# Patient Record
Sex: Female | Born: 1965
Health system: Southern US, Community
[De-identification: ages and names within clinical notes are randomized; demographics above are authoritative.]

## PROBLEM LIST (undated history)

## (undated) DIAGNOSIS — I10 Essential (primary) hypertension: Secondary | ICD-10-CM

## (undated) DIAGNOSIS — S82853A Displaced trimalleolar fracture of unspecified lower leg, initial encounter for closed fracture: Secondary | ICD-10-CM

## (undated) HISTORY — DX: Essential (primary) hypertension: I10

## (undated) HISTORY — PX: NO PAST SURGERIES: SHX2092

---

## 2015-10-16 ENCOUNTER — Encounter: Payer: Self-pay | Admitting: Family

## 2015-10-16 ENCOUNTER — Other Ambulatory Visit: Payer: Self-pay

## 2015-10-16 ENCOUNTER — Ambulatory Visit (INDEPENDENT_AMBULATORY_CARE_PROVIDER_SITE_OTHER): Payer: 59 | Admitting: Family

## 2015-10-16 VITALS — BP 180/102 | HR 66 | Temp 98.2°F | Resp 16 | Ht 66.0 in | Wt 204.0 lb

## 2015-10-16 DIAGNOSIS — I10 Essential (primary) hypertension: Secondary | ICD-10-CM | POA: Insufficient documentation

## 2015-10-16 MED ORDER — NIFEDIPINE ER OSMOTIC RELEASE 90 MG PO TB24: 90.0000 mg | ORAL_TABLET | Freq: Every day | ORAL | Status: DC

## 2015-10-16 NOTE — Patient Instructions (Addendum)
Thank you for choosing [facility].  Summary/Instructions:  Your prescription(s) have been submitted to your pharmacy or been printed and provided for you. Please take as directed and contact our office if you believe you are having problem(s) with the medication(s) or have any questions.  Please stop by the lab on the basement level of the building for your blood work. Your results will be released to [facility] (or called to you) after review, usually within 72 hours after test completion. If any changes need to be made, you will be notified at that same time.  If your symptoms worsen or fail to improve, please contact our office for further instruction, or in case of emergency go directly to the emergency room at the closest medical facility.   Hypertension Hypertension, commonly called high blood pressure, is when the force of blood pumping through your arteries is too strong. Your arteries are the blood vessels that carry blood from your heart throughout your body. A blood pressure reading consists of a higher number over a lower number, such as 110/72. The higher number (systolic) is the pressure inside your arteries when your heart pumps. The lower number (diastolic) is the pressure inside your arteries when your heart relaxes. Ideally you want your blood pressure below 120/80. Hypertension forces your heart to work harder to pump blood. Your arteries may become narrow or stiff. Having untreated or uncontrolled hypertension can cause heart attack, stroke, kidney disease, and other problems. RISK FACTORS Some risk factors for high blood pressure are controllable. Others are not.  Risk factors you cannot control include:   Race. You may be at higher risk if you are African American.  Age. Risk increases with age.  Gender. Men are at higher risk than women before age 42 years. After age 26, women are at higher risk than men. Risk factors you can control include:  Not getting enough  exercise or physical activity.  Being overweight.  Getting too much fat, sugar, calories, or salt in your diet.  Drinking too much alcohol. SIGNS AND SYMPTOMS Hypertension does not usually cause signs or symptoms. Extremely high blood pressure (hypertensive crisis) may cause headache, anxiety, shortness of breath, and nosebleed. DIAGNOSIS To check if you have hypertension, your health care provider will measure your blood pressure while you are seated, with your arm held at the level of your heart. It should be measured at least twice using the same arm. Certain conditions can cause a difference in blood pressure between your right and left arms. A blood pressure reading that is higher than normal on one occasion does not mean that you need treatment. If it is not clear whether you have high blood pressure, you may be asked to return on a different day to have your blood pressure checked again. Or, you may be asked to monitor your blood pressure at home for 1 or more weeks. TREATMENT Treating high blood pressure includes making lifestyle changes and possibly taking medicine. Living a healthy lifestyle can help lower high blood pressure. You may need to change some of your habits. Lifestyle changes may include:  Following the DASH diet. This diet is high in fruits, vegetables, and whole grains. It is low in salt, red meat, and added sugars.  Keep your sodium intake below 2,300 mg per day.  Getting at least 30-45 minutes of aerobic exercise at least 4 times per week.  Losing weight if necessary.  Not smoking.  Limiting alcoholic beverages.  Learning ways to reduce stress.  Your health care provider may prescribe medicine if lifestyle changes are not enough to get your blood pressure under control, and if one of the following is true:  You are 62-39 years of age and your systolic blood pressure is above 140.  You are 8 years of age or older, and your systolic blood pressure is above  150.  Your diastolic blood pressure is above 90.  You have diabetes, and your systolic blood pressure is over [id number] or your diastolic blood pressure is over 90.  You have kidney disease and your blood pressure is above 140/90.  You have heart disease and your blood pressure is above 140/90. Your personal target blood pressure may vary depending on your medical conditions, your age, and other factors. HOME CARE INSTRUCTIONS  Have your blood pressure rechecked as directed by your health care provider.   Take medicines only as directed by your health care provider. Follow the directions carefully. Blood pressure medicines must be taken as prescribed. The medicine does not work as well when you skip doses. Skipping doses also puts you at risk for problems.  Do not smoke.   Monitor your blood pressure at home as directed by your health care provider. SEEK MEDICAL CARE IF:   You think you are having a reaction to medicines taken.  You have recurrent headaches or feel dizzy.  You have swelling in your ankles.  You have trouble with your vision. SEEK IMMEDIATE MEDICAL CARE IF:  You develop a severe headache or confusion.  You have unusual weakness, numbness, or feel faint.  You have severe chest or abdominal pain.  You vomit repeatedly.  You have trouble breathing. MAKE SURE YOU:   Understand these instructions.  Will watch your condition.  Will get help right away if you are not doing well or get worse.   This information is not intended to replace advice given to you by your health care provider. Make sure you discuss any questions you have with your health care provider.   Document Released: 07/01/2005 Document Revised: 11/15/2014 Document Reviewed: 04/23/2013 Elsevier Interactive Patient Education [employer].

## 2015-10-16 NOTE — Progress Notes (Signed)
Subjective:    Patient ID: Colleen Potter, female    DOB: [birth year], 50 y.o.   MRN: [id number]  Chief Complaint  Patient presents with  . Establish Care    Refill of medication     HPI:  Colleen Potter is a 50 y.o. female who  has a past medical history of Hypertension. and presents today For an office visit to establish care.  1.) Hypertension - Previously diagnosed with hypertension. Reports taking the medication as prescribed when she has it and has been out over the past 2 months. Does not currently monitor her blood pressure at home. Denies symptoms of end organ damage.   BP Readings from Last 3 Encounters:  10/16/15 180/102    No Known Allergies   No outpatient prescriptions prior to visit.   No facility-administered medications prior to visit.     Past Medical History  Diagnosis Date  . Hypertension      History reviewed. No pertinent past surgical history.   Family History  Problem Relation Age of Onset  . Healthy Mother      Social History   Social History  . Marital Status: Single    Spouse Name: N/A  . Number of Children: 2  . Years of Education: 12   Occupational History  . E[employer]    Social History Main Topics  . Smoking status: Never Smoker   . Smokeless tobacco: Never Used  . Alcohol Use: No  . Drug Use: No  . Sexual Activity: Not on file   Other Topics Concern  . Not on file   Social History Narrative   Fun: Shop.   Denies abuse and feels safe at home.      Review of Systems  Constitutional: Negative for fever, chills and fatigue.  Eyes:       Negative for changes in vision.   Respiratory: Negative for chest tightness and shortness of breath.   Cardiovascular: Negative for chest pain, palpitations and leg swelling.  Neurological: Negative for headaches.      Objective:    BP 180/102 mmHg  Pulse 66  Temp(Src) 98.2 F (36.8 C) (Oral)  Resp 16  Ht 5\' 6"  (1.676 m)  Wt 204 lb (92.534 kg)  BMI 32.94 kg/m2  SpO2 97% Nursing  note and vital signs reviewed.  Physical Exam  Constitutional: She is oriented to person, place, and time. She appears well-developed and well-nourished. No distress.  Cardiovascular: Normal rate, regular rhythm, normal heart sounds and intact distal pulses.   Pulmonary/Chest: Effort normal and breath sounds normal.  Neurological: She is alert and oriented to person, place, and time.  Skin: Skin is warm and dry.  Psychiatric: She has a normal mood and affect. Her behavior is normal. Judgment and thought content normal.       Assessment & Plan:   Problem List Items Addressed This Visit      Cardiovascular and Mediastinum   Essential hypertension - Primary    Blood pressure is above goal 140/90 secondary to not having medications for 2 months. No symptoms of end organ damage. Obtain basic metabolic panel. Restart nifedipine. Encouraged to monitor blood pressure at home. Discussed lifestyle management to help control her blood pressure. Follow-up in 3 weeks for nurse visit to determine adequate control.      Relevant Medications   NIFEdipine (PROCARDIA XL/ADALAT-CC) 90 MG 24 hr tablet   Other Relevant Orders   Basic Metabolic Panel (BMET)      I have changed Ms.  Marcinek's NIFEdipine.   Follow-up: Return in about 3 weeks (around 11/06/2015) for Nurse Visit for BP.  Mauricio Po, FNP

## 2015-10-16 NOTE — Assessment & Plan Note (Signed)
Blood pressure is above goal 140/90 secondary to not having medications for 2 months. No symptoms of end organ damage. Obtain basic metabolic panel. Restart nifedipine. Encouraged to monitor blood pressure at home. Discussed lifestyle management to help control her blood pressure. Follow-up in 3 weeks for nurse visit to determine adequate control.

## 2015-10-16 NOTE — Progress Notes (Signed)
Pre visit review using our clinic review tool, if applicable. No additional management support is needed unless otherwise documented below in the visit note. 

## 2015-10-30 MED FILL — NIFEDIPINE ER 90 MG TABLET: 90 | 90 days supply | Qty: 90 | Fill #0

## 2016-02-06 ENCOUNTER — Ambulatory Visit: Payer: 59 | Admitting: Family

## 2016-02-07 ENCOUNTER — Telehealth: Payer: Self-pay | Admitting: Family

## 2016-02-07 NOTE — Telephone Encounter (Signed)
Patient no showed for med refill follow up on 7/25.  Please advise.

## 2016-02-07 NOTE — Telephone Encounter (Signed)
Ok to reschedule if she calls back.  

## 2016-02-08 NOTE — Telephone Encounter (Signed)
noted 

## 2016-02-29 ENCOUNTER — Encounter: Payer: Self-pay | Admitting: Family

## 2016-02-29 ENCOUNTER — Ambulatory Visit (INDEPENDENT_AMBULATORY_CARE_PROVIDER_SITE_OTHER): Payer: 59 | Admitting: Family

## 2016-02-29 DIAGNOSIS — I10 Essential (primary) hypertension: Secondary | ICD-10-CM | POA: Diagnosis not present

## 2016-02-29 MED ORDER — NIFEDIPINE ER OSMOTIC RELEASE 90 MG PO TB24: 90.0000 mg | ORAL_TABLET | Freq: Every day | ORAL | 0 refills | Status: DC

## 2016-02-29 MED FILL — NIFEDIPINE ER 90 MG TABLET: 90 | 90 days supply | Qty: 90 | Fill #0

## 2016-02-29 NOTE — Patient Instructions (Addendum)
Thank you for choosing [facility].  Summary/Instructions:  Please continue to take your medication as prescribed.  Monitor your blood pressure at home as able.   DASH Eating Plan DASH stands for "Dietary Approaches to Stop Hypertension." The DASH eating plan is a healthy eating plan that has been shown to reduce high blood pressure (hypertension). Additional health benefits may include reducing the risk of type 2 diabetes mellitus, heart disease, and stroke. The DASH eating plan may also help with weight loss. WHAT DO I NEED TO KNOW ABOUT THE DASH EATING PLAN? For the DASH eating plan, you will follow these general guidelines:  Choose foods with a percent daily value for sodium of less than 5% (as listed on the food label).  Use salt-free seasonings or herbs instead of table salt or sea salt.  Check with your health care provider or pharmacist before using salt substitutes.  Eat lower-sodium products, often labeled as "lower sodium" or "no salt added."  Eat fresh foods.  Eat more vegetables, fruits, and low-fat dairy products.  Choose whole grains. Look for the word "whole" as the first word in the ingredient list.  Choose fish and skinless chicken or [place] more often than red meat. Limit fish, poultry, and meat to 6 oz (170 g) each day.  Limit sweets, desserts, sugars, and sugary drinks.  Choose heart-healthy fats.  Limit cheese to 1 oz (28 g) per day.  Eat more home-cooked food and less restaurant, buffet, and fast food.  Limit fried foods.  Cook foods using methods other than frying.  Limit canned vegetables. If you do use them, rinse them well to decrease the sodium.  When eating at a restaurant, ask that your food be prepared with less salt, or no salt if possible. WHAT FOODS CAN I EAT? Seek help from a dietitian for individual calorie needs. Grains Whole grain or whole wheat bread. Brown rice. Whole grain or whole wheat pasta. Quinoa, bulgur, and whole  grain cereals. Low-sodium cereals. Corn or whole wheat flour tortillas. Whole grain cornbread. Whole grain crackers. Low-sodium crackers. Vegetables Fresh or frozen vegetables (raw, steamed, roasted, or grilled). Low-sodium or reduced-sodium tomato and vegetable juices. Low-sodium or reduced-sodium tomato sauce and paste. Low-sodium or reduced-sodium canned vegetables.  Fruits All fresh, canned (in natural juice), or frozen fruits. Meat and Other Protein Products Ground beef (85% or leaner), grass-fed beef, or beef trimmed of fat. Skinless chicken or [place]. Ground chicken or [place]. Pork trimmed of fat. All fish and seafood. Eggs. Dried beans, peas, or lentils. Unsalted nuts and seeds. Unsalted canned beans. Dairy Low-fat dairy products, such as skim or 1% milk, 2% or reduced-fat cheeses, low-fat ricotta or cottage cheese, or plain low-fat yogurt. Low-sodium or reduced-sodium cheeses. Fats and Oils Tub margarines without trans fats. Light or reduced-fat mayonnaise and salad dressings (reduced sodium). Avocado. Safflower, olive, or canola oils. Natural peanut or almond butter. Other Unsalted popcorn and pretzels. The items listed above may not be a complete list of recommended foods or beverages. Contact your dietitian for more options. WHAT FOODS ARE NOT RECOMMENDED? Grains White bread. White pasta. White rice. Refined cornbread. Bagels and croissants. Crackers that contain trans fat. Vegetables Creamed or fried vegetables. Vegetables in a cheese sauce. Regular canned vegetables. Regular canned tomato sauce and paste. Regular tomato and vegetable juices. Fruits Dried fruits. Canned fruit in light or heavy syrup. Fruit juice. Meat and Other Protein Products Fatty cuts of meat. Ribs, chicken wings, bacon, sausage, bologna, salami, chitterlings, fatback, hot dogs,  bratwurst, and packaged luncheon meats. Salted nuts and seeds. Canned beans with salt. Dairy Whole or 2% milk, cream, half-and-half,  and cream cheese. Whole-fat or sweetened yogurt. Full-fat cheeses or blue cheese. Nondairy creamers and whipped toppings. Processed cheese, cheese spreads, or cheese curds. Condiments Onion and garlic salt, seasoned salt, table salt, and sea salt. Canned and packaged gravies. Worcestershire sauce. Tartar sauce. Barbecue sauce. Teriyaki sauce. Soy sauce, including reduced sodium. Steak sauce. Fish sauce. Oyster sauce. Cocktail sauce. Horseradish. Ketchup and mustard. Meat flavorings and tenderizers. Bouillon cubes. Hot sauce. Tabasco sauce. Marinades. Taco seasonings. Relishes. Fats and Oils Butter, stick margarine, lard, shortening, ghee, and bacon fat. Coconut, palm kernel, or palm oils. Regular salad dressings. Other Pickles and olives. Salted popcorn and pretzels. The items listed above may not be a complete list of foods and beverages to avoid. Contact your dietitian for more information. WHERE CAN I FIND MORE INFORMATION? National Heart, Lung, and Blood Institute: travelstabloid.com   This information is not intended to replace advice given to you by your health care provider. Make sure you discuss any questions you have with your health care provider.   Document Released: 06/20/2011 Document Revised: 07/22/2014 Document Reviewed: 05/05/2013 Elsevier Interactive Patient Education [employer].

## 2016-02-29 NOTE — Assessment & Plan Note (Signed)
Blood pressure remains uncontrolled and above goal 140/90 with current regimen with concern for patient compliance is refills are inconsistent. No symptoms of end organ damage noted assessment or worse headache of life. Encouraged to follow low-sodium diet. Monitor blood pressure at home. Follow-up in 3 weeks for nurse visit check.

## 2016-02-29 NOTE — Progress Notes (Signed)
   Subjective:    Patient ID: Colleen Potter, female    DOB: 1965/09/22, 50 y.o.   MRN: [id number]  Chief Complaint  Patient presents with  . Medication Refill    medication refill    HPI:  Colleen Potter is a 50 y.o. female who  has a past medical history of Hypertension. and presents today for a follow up.  1.) Hypertension - Currently maintained on nifedipine. Reports that she has been out of the medication for the past 2 weeks. Does not currently monitor her blood pressure at home. Denies symptoms of end organ damage or worst headache of life. Does not currently follow a low sodium diet. Works as a Secretary/administrator.   BP Readings from Last 3 Encounters:  02/29/16 (!) 182/92  10/16/15 (!) 180/102    No Known Allergies   Outpatient Medications Prior to Visit  Medication Sig Dispense Refill  . NIFEdipine (PROCARDIA XL/ADALAT-CC) 90 MG 24 hr tablet Take 1 tablet (90 mg total) by mouth daily. 90 tablet 0   No facility-administered medications prior to visit.     Review of Systems  Constitutional: Negative for chills and fever.  Eyes:       Negative for changes in vision  Respiratory: Negative for cough, chest tightness and wheezing.   Cardiovascular: Negative for chest pain, palpitations and leg swelling.  Neurological: Negative for dizziness, weakness and light-headedness.      Objective:    BP (!) 182/92 (BP Location: Left Arm, Patient Position: Sitting, Cuff Size: Large)   Pulse 74   Temp 98.2 F (36.8 C) (Oral)   Resp 16   Ht 5\' 6"  (1.676 m)   Wt 197 lb (89.4 kg)   SpO2 99%   BMI 31.80 kg/m  Nursing note and vital signs reviewed.  Physical Exam  Constitutional: She is oriented to person, place, and time. She appears well-developed and well-nourished. No distress.  Cardiovascular: Normal rate, regular rhythm, normal heart sounds and intact distal pulses.   Pulmonary/Chest: Effort normal and breath sounds normal.  Neurological: She is alert and oriented to person, place,  and time.  Skin: Skin is warm and dry.  Psychiatric: She has a normal mood and affect. Her behavior is normal. Judgment and thought content normal.       Assessment & Plan:   Problem List Items Addressed This Visit      Cardiovascular and Mediastinum   Essential hypertension    Blood pressure remains uncontrolled and above goal 140/90 with current regimen with concern for patient compliance is refills are inconsistent. No symptoms of end organ damage noted assessment or worse headache of life. Encouraged to follow low-sodium diet. Monitor blood pressure at home. Follow-up in 3 weeks for nurse visit check.      Relevant Medications   NIFEdipine (PROCARDIA XL/ADALAT-CC) 90 MG 24 hr tablet    Other Visit Diagnoses   None.      I am having Ms. Eckhardt maintain her NIFEdipine.   Meds ordered this encounter  Medications  . NIFEdipine (PROCARDIA XL/ADALAT-CC) 90 MG 24 hr tablet    Sig: Take 1 tablet (90 mg total) by mouth daily.    Dispense:  90 tablet    Refill:  0    Order Specific Question:   Supervising Provider    Answer:   Pricilla Holm A [id number]     Follow-up: Return in about 3 weeks (around 03/21/2016), or if symptoms worsen or fail to improve.  Mauricio Po, FNP

## 2016-03-19 ENCOUNTER — Ambulatory Visit: Payer: 59 | Admitting: General Practice

## 2016-03-19 VITALS — BP 138/74

## 2016-03-19 DIAGNOSIS — Z0130 Encounter for examination of blood pressure without abnormal findings: Secondary | ICD-10-CM

## 2016-03-19 NOTE — Progress Notes (Signed)
Blood pressure reviewed. Continue with current medication regimen.

## 2016-05-24 ENCOUNTER — Other Ambulatory Visit: Payer: Self-pay | Admitting: *Deleted

## 2016-05-24 DIAGNOSIS — I10 Essential (primary) hypertension: Secondary | ICD-10-CM

## 2016-05-24 MED ORDER — NIFEDIPINE ER OSMOTIC RELEASE 90 MG PO TB24
90.0000 mg | ORAL_TABLET | Freq: Every day | ORAL | 0 refills | Status: DC
Start: 2016-05-24 — End: 2016-09-18

## 2016-05-24 NOTE — Telephone Encounter (Signed)
Left msg on triage requesting refill on her Procardia. Sent electronically to [facility] pharmacy...Johny Chess

## 2016-06-03 MED FILL — NIFEDIPINE ER 90 MG TABLET: 90 | 90 days supply | Qty: 90 | Fill #0

## 2016-08-03 ENCOUNTER — Emergency Department (HOSPITAL_COMMUNITY): Payer: 59

## 2016-08-03 ENCOUNTER — Encounter (HOSPITAL_COMMUNITY): Payer: Self-pay

## 2016-08-03 ENCOUNTER — Emergency Department (HOSPITAL_COMMUNITY)
Admission: EM | Admit: 2016-08-03 | Discharge: 2016-08-04 | Disposition: A | Discharge status: Home / Self Care | Payer: 59 | Attending: Emergency Medicine | Admitting: Emergency Medicine

## 2016-08-03 DIAGNOSIS — S99911A Unspecified injury of right ankle, initial encounter: Secondary | ICD-10-CM | POA: Diagnosis present

## 2016-08-03 DIAGNOSIS — Y999 Unspecified external cause status: Secondary | ICD-10-CM | POA: Insufficient documentation

## 2016-08-03 DIAGNOSIS — Y939 Activity, unspecified: Secondary | ICD-10-CM | POA: Insufficient documentation

## 2016-08-03 DIAGNOSIS — W010XXA Fall on same level from slipping, tripping and stumbling without subsequent striking against object, initial encounter: Secondary | ICD-10-CM | POA: Insufficient documentation

## 2016-08-03 DIAGNOSIS — M25571 Pain in right ankle and joints of right foot: Secondary | ICD-10-CM | POA: Diagnosis not present

## 2016-08-03 DIAGNOSIS — Y929 Unspecified place or not applicable: Secondary | ICD-10-CM | POA: Insufficient documentation

## 2016-08-03 DIAGNOSIS — S82841A Displaced bimalleolar fracture of right lower leg, initial encounter for closed fracture: Secondary | ICD-10-CM | POA: Insufficient documentation

## 2016-08-03 DIAGNOSIS — S82853A Displaced trimalleolar fracture of unspecified lower leg, initial encounter for closed fracture: Secondary | ICD-10-CM

## 2016-08-03 DIAGNOSIS — T148XXA Other injury of unspecified body region, initial encounter: Secondary | ICD-10-CM | POA: Diagnosis not present

## 2016-08-03 DIAGNOSIS — I10 Essential (primary) hypertension: Secondary | ICD-10-CM | POA: Insufficient documentation

## 2016-08-03 HISTORY — DX: Displaced trimalleolar fracture of unspecified lower leg, initial encounter for closed fracture: S82.853A

## 2016-08-03 MED ORDER — MORPHINE SULFATE (PF) 4 MG/ML IV SOLN
4.0000 mg | Freq: Once | INTRAVENOUS | Status: AC
  Administered 2016-08-03: 4 mg via INTRAVENOUS
  Filled 2016-08-03: qty 1

## 2016-08-03 NOTE — ED Provider Notes (Signed)
[facility] DEPT Provider Note   CSN: [id number] Arrival date & time: 08/03/16  2245     History   Chief Complaint Chief Complaint  Patient presents with  . Ankle Injury    HPI Colleen Potter is a 51 y.o. female.  She slipped and fell injuring her right ankle. She denies other injury. Pain is rated at 10/10. EMS treated her with splint application. She's not had anything for pain. She denies head, neck, back, hip, knee injury.   The history is provided by the patient.  Ankle Injury     Past Medical History:  Diagnosis Date  . Hypertension     Patient Active Problem List   Diagnosis Date Noted  . Essential hypertension 10/16/2015    No past surgical history on file.  OB History    No data available       Home Medications    Prior to Admission medications   Medication Sig Start Date End Date Taking? Authorizing Provider  NIFEdipine (PROCARDIA XL/ADALAT-CC) 90 MG 24 hr tablet Take 1 tablet (90 mg total) by mouth daily. 05/24/16   Golden Circle, FNP    Family History Family History  Problem Relation Age of Onset  . Healthy Mother     Social History Social History  Substance Use Topics  . Smoking status: Never Smoker  . Smokeless tobacco: Never Used  . Alcohol use No     Allergies   Patient has no known allergies.   Review of Systems Review of Systems  All other systems reviewed and are negative.    Physical Exam Updated Vital Signs BP 165/91 (BP Location: Right Arm)   Pulse 80   Temp 98.2 F (36.8 C) (Oral)   Resp 20   SpO2 100%   Physical Exam  Nursing note and vitals reviewed.  51 year old female, resting comfortably and in no acute distress. Vital signs are Significant for hypertension. Oxygen saturation is 100%, which is normal. Head is normocephalic and atraumatic. PERRLA, EOMI. Oropharynx is clear. Neck is nontender and supple without adenopathy or JVD. Back is nontender and there is no CVA tenderness. Lungs are clear  without rales, wheezes, or rhonchi. Chest is nontender. Heart has regular rate and rhythm without murmur. Abdomen is soft, flat, nontender without masses or hepatosplenomegaly and peristalsis is normoactive. Extremities: Right ankle is in a splint. There is moderate swelling of the ankle with mild deformity. There is marked instability of the ankle mortise. Dorsalis pedis pulse is 2+. There is prompt capillary refill. There is normal sensation and movement of her toes. There is no tenderness to palpation over the fibular head. No other extremity injuries seen. Skin is warm and dry without rash. Neurologic: Mental status is normal, cranial nerves are intact, there are no motor or sensory deficits.  ED Treatments / Results  Labs (all labs ordered are listed, but only abnormal results are displayed) Labs Reviewed - No data to display  EKG  EKG Interpretation None       Radiology No results found.  Procedures .Splint Application Date/Time: [id number] 2:33 AM Performed by: Delora Fuel Authorized by: Roxanne Mins, Kristy Schomburg   Consent:    Consent obtained:  Verbal   Consent given by:  Patient   Risks discussed:  Pain and numbness   Alternatives discussed:  No treatment Pre-procedure details:    Sensation:  Normal   Skin color:  Pink Procedure details:    Laterality:  Right   Location:  Ankle   Ankle:  R ankle   Strapping: no     Splint type:  Sugar tong (with posterior )   Supplies:  Ortho-Glass Post-procedure details:    Pain:  Improved   Sensation:  Normal   Skin color:  Pink   Patient tolerance of procedure:  Tolerated well, no immediate complications Comments:     Splint applied by ortho tech, neurovascular status checked by me after splint application.   (including critical care time)  Medications Ordered in ED Medications  morphine 4 MG/ML injection 4 mg (not administered)  morphine 4 MG/ML injection 4 mg (4 mg Intravenous Given 08/03/16 2303)  morphine 4 MG/ML injection  4 mg (4 mg Intravenous Given 08/04/16 0041)     Initial Impression / Assessment and Plan / ED Course  I have reviewed the triage vital signs and the nursing notes.  Pertinent labs & imaging results that were available during my care of the patient were reviewed by me and considered in my medical decision making (see chart for details).  Right ankle injury suspicious for fracture, probable bimalleolar or trimalleolar fracture. She is being sent for x-rays. Review of past records shows no relevant past visits.  X-rays show bimalleolar fracture with subluxation. Case was discussed with Dr. Erlinda Hong, on call for orthopedics. She is a sin a stirrup and posterior splint and given crutches and prescription given for oxycodone have acetaminophen. She is to follow-up with Dr. Erlinda Hong on January 22.  Final Clinical Impressions(s) / ED Diagnoses   Final diagnoses:  Fall from slipping, initial encounter  Closed bimalleolar fracture of right ankle, initial encounter    New Prescriptions New Prescriptions   OXYCODONE-ACETAMINOPHEN (PERCOCET) 5-325 MG TABLET    Take 1 tablet by mouth every 4 (four) hours as needed for moderate pain.     Delora Fuel, MD [id number]

## 2016-08-03 NOTE — ED Triage Notes (Signed)
Per EMS pt was coming back into her house and as she stepped inside she twisted her right ankle  Pt sat down when it happened  Pt did not fall  Pt has swelling noted  EMS applied a splint and ice  PMS intact

## 2016-08-04 DIAGNOSIS — S82841A Displaced bimalleolar fracture of right lower leg, initial encounter for closed fracture: Secondary | ICD-10-CM | POA: Diagnosis not present

## 2016-08-04 DIAGNOSIS — I10 Essential (primary) hypertension: Secondary | ICD-10-CM | POA: Diagnosis not present

## 2016-08-04 MED ORDER — MORPHINE SULFATE (PF) 4 MG/ML IV SOLN
4.0000 mg | Freq: Once | INTRAVENOUS | Status: AC
  Administered 2016-08-04: 4 mg via INTRAVENOUS
  Filled 2016-08-04: qty 1

## 2016-08-04 MED ORDER — OXYCODONE-ACETAMINOPHEN 5-325 MG PO TABS: 1.0000 | ORAL_TABLET | ORAL | 0 refills | Status: DC | PRN

## 2016-08-04 MED ORDER — MORPHINE SULFATE (PF) 4 MG/ML IV SOLN: 4.0000 mg | Freq: Once | INTRAVENOUS | Status: DC

## 2016-08-06 ENCOUNTER — Other Ambulatory Visit (INDEPENDENT_AMBULATORY_CARE_PROVIDER_SITE_OTHER): Payer: Self-pay | Admitting: Orthopaedic Surgery

## 2016-08-06 ENCOUNTER — Encounter (HOSPITAL_BASED_OUTPATIENT_CLINIC_OR_DEPARTMENT_OTHER): Payer: Self-pay | Admitting: *Deleted

## 2016-08-06 ENCOUNTER — Encounter (INDEPENDENT_AMBULATORY_CARE_PROVIDER_SITE_OTHER): Payer: Self-pay | Admitting: Orthopaedic Surgery

## 2016-08-06 ENCOUNTER — Ambulatory Visit (INDEPENDENT_AMBULATORY_CARE_PROVIDER_SITE_OTHER): Payer: 59 | Admitting: Orthopaedic Surgery

## 2016-08-06 DIAGNOSIS — S82851A Displaced trimalleolar fracture of right lower leg, initial encounter for closed fracture: Secondary | ICD-10-CM

## 2016-08-06 NOTE — Progress Notes (Addendum)
   Office Visit Note   Patient: Colleen Potter           Date of Birth: June 24, 1966           MRN: [id number] Visit Date: 08/06/2016              Requested by: [address], [place], [place] [address] PCP: Mauricio Po, FNP   Assessment & Plan: Visit Diagnoses:  1. Displaced trimalleolar fracture of right lower leg, initial encounter for closed fracture     Plan: Patient has right trimalleolar ankle fracture that is unstable and displaced. Recommend operative fixation. Discussed risks benefits alternatives to surgery and patient understands and wishes to proceed. Plan on surgery tomorrow. She needs to elevate this at all times. Anticipated out of work for 3 months.  Follow-Up Instructions: Return for 2 week postop visit.   Orders:  No orders of the defined types were placed in this encounter.  No orders of the defined types were placed in this encounter.     Procedures: No procedures performed   Clinical Data: No additional findings.   Subjective: Chief Complaint  Patient presents with  . Right Ankle - Fracture    Patient comes in today for right bimalleolar ankle fracture that she sustained on 08/03/2016 for mechanical fall. She was splinted and given follow-up. She endorses pain.    Review of Systems  Constitutional: Negative.   HENT: Negative.   Eyes: Negative.   Respiratory: Negative.   Cardiovascular: Negative.   Endocrine: Negative.   Musculoskeletal: Negative.   Neurological: Negative.   Hematological: Negative.   Psychiatric/Behavioral: Negative.   All other systems reviewed and are negative.    Objective: Vital Signs: LMP 07/30/2016 (Approximate)   Physical Exam  Constitutional: She is oriented to person, place, and time. She appears well-developed and well-nourished.  HENT:  Head: Normocephalic and atraumatic.  Eyes: EOM are normal.  Neck: Neck supple.  Pulmonary/Chest: Effort normal.  Abdominal: Soft.  Neurological:  She is alert and oriented to person, place, and time.  Skin: Skin is warm. Capillary refill takes less than 2 seconds.  Psychiatric: She has a normal mood and affect. Her behavior is normal. Judgment and thought content normal.  Nursing note and vitals reviewed.   Ortho Exam Patient has moderate swelling of her ankle. There is no skin changes. Foot is neurovascular intact Specialty Comments:  No specialty comments available.  Imaging: No results found.   PMFS History: Patient Active Problem List   Diagnosis Date Noted  . Displaced trimalleolar fracture of right lower leg, initial encounter for closed fracture 08/06/2016  . Essential hypertension 10/16/2015   Past Medical History:  Diagnosis Date  . Hypertension    states under control with med., has been on med. x 2 yr.  . Trimalleolar fracture of ankle, closed 08/03/2016   right    No family history on file.  Past Surgical History:  Procedure Laterality Date  . NO PAST SURGERIES     Social History   Occupational History  . [employer]    Social History Main Topics  . Smoking status: Never Smoker  . Smokeless tobacco: Never Used  . Alcohol use No  . Drug use: No  . Sexual activity: Not on file

## 2016-08-07 ENCOUNTER — Ambulatory Visit (HOSPITAL_BASED_OUTPATIENT_CLINIC_OR_DEPARTMENT_OTHER)
Admission: RE | Admit: 2016-08-07 | Discharge: 2016-08-07 | Disposition: A | Discharge status: Home / Self Care | Payer: 59 | Source: Ambulatory Visit | Attending: Orthopaedic Surgery | Admitting: Orthopaedic Surgery

## 2016-08-07 ENCOUNTER — Encounter (HOSPITAL_BASED_OUTPATIENT_CLINIC_OR_DEPARTMENT_OTHER)
Admission: RE | Disposition: A | Discharge status: Home / Self Care | Payer: Self-pay | Source: Ambulatory Visit | Attending: Orthopaedic Surgery

## 2016-08-07 ENCOUNTER — Ambulatory Visit (HOSPITAL_COMMUNITY): Payer: 59

## 2016-08-07 ENCOUNTER — Ambulatory Visit (HOSPITAL_BASED_OUTPATIENT_CLINIC_OR_DEPARTMENT_OTHER): Payer: 59 | Admitting: Certified Registered"

## 2016-08-07 ENCOUNTER — Encounter (HOSPITAL_BASED_OUTPATIENT_CLINIC_OR_DEPARTMENT_OTHER): Payer: Self-pay | Admitting: Certified Registered"

## 2016-08-07 DIAGNOSIS — S82851A Displaced trimalleolar fracture of right lower leg, initial encounter for closed fracture: Secondary | ICD-10-CM | POA: Insufficient documentation

## 2016-08-07 DIAGNOSIS — Z419 Encounter for procedure for purposes other than remedying health state, unspecified: Secondary | ICD-10-CM

## 2016-08-07 DIAGNOSIS — X58XXXA Exposure to other specified factors, initial encounter: Secondary | ICD-10-CM | POA: Diagnosis not present

## 2016-08-07 DIAGNOSIS — I10 Essential (primary) hypertension: Secondary | ICD-10-CM | POA: Diagnosis not present

## 2016-08-07 DIAGNOSIS — S8261XA Displaced fracture of lateral malleolus of right fibula, initial encounter for closed fracture: Secondary | ICD-10-CM | POA: Diagnosis not present

## 2016-08-07 DIAGNOSIS — G8918 Other acute postprocedural pain: Secondary | ICD-10-CM | POA: Diagnosis not present

## 2016-08-07 HISTORY — PX: ORIF ANKLE FRACTURE: SHX5408

## 2016-08-07 HISTORY — DX: Displaced trimalleolar fracture of unspecified lower leg, initial encounter for closed fracture: S82.853A

## 2016-08-07 SURGERY — OPEN REDUCTION INTERNAL FIXATION (ORIF) ANKLE FRACTURE
Anesthesia: Regional | Site: Ankle | Laterality: Right

## 2016-08-07 MED ORDER — FENTANYL CITRATE (PF) 100 MCG/2ML IJ SOLN
INTRAMUSCULAR | Status: AC
  Filled 2016-08-07: qty 2

## 2016-08-07 MED ORDER — ONDANSETRON HCL 4 MG/2ML IJ SOLN
INTRAMUSCULAR | Status: DC | PRN
  Administered 2016-08-07: 4 mg via INTRAVENOUS

## 2016-08-07 MED ORDER — MEPERIDINE HCL 25 MG/ML IJ SOLN: 6.2500 mg | INTRAMUSCULAR | Status: DC | PRN

## 2016-08-07 MED ORDER — PROMETHAZINE HCL 25 MG PO TABS: 25.0000 mg | ORAL_TABLET | Freq: Four times a day (QID) | ORAL | 1 refills | Status: DC | PRN

## 2016-08-07 MED ORDER — ASPIRIN EC 325 MG PO TBEC: 325.0000 mg | DELAYED_RELEASE_TABLET | Freq: Two times a day (BID) | ORAL | 0 refills | Status: DC

## 2016-08-07 MED ORDER — MIDAZOLAM HCL 2 MG/2ML IJ SOLN
INTRAMUSCULAR | Status: AC
  Filled 2016-08-07: qty 2

## 2016-08-07 MED ORDER — ONDANSETRON HCL 4 MG/2ML IJ SOLN
INTRAMUSCULAR | Status: AC
  Filled 2016-08-07: qty 2

## 2016-08-07 MED ORDER — BUPIVACAINE-EPINEPHRINE (PF) 0.5% -1:200000 IJ SOLN
INTRAMUSCULAR | Status: DC | PRN
  Administered 2016-08-07: 25 mL via PERINEURAL

## 2016-08-07 MED ORDER — CEFAZOLIN SODIUM-DEXTROSE 2-4 GM/100ML-% IV SOLN
2.0000 g | INTRAVENOUS | Status: AC
  Administered 2016-08-07: 2 g via INTRAVENOUS

## 2016-08-07 MED ORDER — FENTANYL CITRATE (PF) 100 MCG/2ML IJ SOLN
INTRAMUSCULAR | Status: DC | PRN
  Administered 2016-08-07: 50 ug via INTRAVENOUS

## 2016-08-07 MED ORDER — ROPIVACAINE HCL 5 MG/ML IJ SOLN
INTRAMUSCULAR | Status: DC | PRN
  Administered 2016-08-07: 10 mL via EPIDURAL

## 2016-08-07 MED ORDER — SCOPOLAMINE 1 MG/3DAYS TD PT72: 1.0000 | MEDICATED_PATCH | Freq: Once | TRANSDERMAL | Status: DC | PRN

## 2016-08-07 MED ORDER — FENTANYL CITRATE (PF) 100 MCG/2ML IJ SOLN
100.0000 ug | Freq: Once | INTRAMUSCULAR | Status: AC
Start: 2016-08-07 — End: 2016-08-07
  Administered 2016-08-07: 100 ug via INTRAVENOUS

## 2016-08-07 MED ORDER — METHOCARBAMOL 750 MG PO TABS: 750.0000 mg | ORAL_TABLET | Freq: Two times a day (BID) | ORAL | 0 refills | Status: DC | PRN

## 2016-08-07 MED ORDER — SENNOSIDES-DOCUSATE SODIUM 8.6-50 MG PO TABS: 1.0000 | ORAL_TABLET | Freq: Every evening | ORAL | 1 refills | Status: DC | PRN

## 2016-08-07 MED ORDER — OXYCODONE HCL ER 10 MG PO T12A: 10.0000 mg | EXTENDED_RELEASE_TABLET | Freq: Two times a day (BID) | ORAL | 0 refills | Status: DC

## 2016-08-07 MED ORDER — LIDOCAINE 2% (20 MG/ML) 5 ML SYRINGE
INTRAMUSCULAR | Status: AC
  Filled 2016-08-07: qty 5

## 2016-08-07 MED ORDER — LIDOCAINE 2% (20 MG/ML) 5 ML SYRINGE
INTRAMUSCULAR | Status: DC | PRN
  Administered 2016-08-07: 60 mg via INTRAVENOUS

## 2016-08-07 MED ORDER — ONDANSETRON HCL 4 MG PO TABS: 4.0000 mg | ORAL_TABLET | Freq: Three times a day (TID) | ORAL | 0 refills | Status: DC | PRN

## 2016-08-07 MED ORDER — FENTANYL CITRATE (PF) 100 MCG/2ML IJ SOLN: 25.0000 ug | INTRAMUSCULAR | Status: DC | PRN

## 2016-08-07 MED ORDER — OXYCODONE-ACETAMINOPHEN 5-325 MG PO TABS: 1.0000 | ORAL_TABLET | ORAL | 0 refills | Status: DC | PRN

## 2016-08-07 MED ORDER — METOCLOPRAMIDE HCL 5 MG/ML IJ SOLN: 10.0000 mg | Freq: Once | INTRAMUSCULAR | Status: DC | PRN

## 2016-08-07 MED ORDER — LACTATED RINGERS IV SOLN: INTRAVENOUS | Status: DC

## 2016-08-07 MED ORDER — DEXAMETHASONE SODIUM PHOSPHATE 10 MG/ML IJ SOLN
INTRAMUSCULAR | Status: AC
  Filled 2016-08-07: qty 1

## 2016-08-07 MED ORDER — FENTANYL CITRATE (PF) 100 MCG/2ML IJ SOLN: 50.0000 ug | INTRAMUSCULAR | Status: DC | PRN

## 2016-08-07 MED ORDER — LACTATED RINGERS IV SOLN
INTRAVENOUS | Status: DC
  Administered 2016-08-07 (×2): via INTRAVENOUS

## 2016-08-07 MED ORDER — PROPOFOL 10 MG/ML IV BOLUS
INTRAVENOUS | Status: DC | PRN
  Administered 2016-08-07: 160 mg via INTRAVENOUS

## 2016-08-07 MED ORDER — MIDAZOLAM HCL 2 MG/2ML IJ SOLN
1.0000 mg | INTRAMUSCULAR | Status: DC | PRN
  Administered 2016-08-07: 2 mg via INTRAVENOUS

## 2016-08-07 MED ORDER — CEFAZOLIN SODIUM-DEXTROSE 2-4 GM/100ML-% IV SOLN
INTRAVENOUS | Status: AC
  Filled 2016-08-07: qty 100

## 2016-08-07 SURGICAL SUPPLY — 81 items
BANDAGE ACE 4X5 VEL STRL LF (GAUZE/BANDAGES/DRESSINGS) IMPLANT
BANDAGE ACE 6X5 VEL STRL LF (GAUZE/BANDAGES/DRESSINGS) ×2 IMPLANT
BANDAGE ESMARK 6X9 LF (GAUZE/BANDAGES/DRESSINGS) ×1 IMPLANT
BIT DRILL 3.5X122MM AO FIT (BIT) ×2 IMPLANT
BIT DRILL CANN 2.7 (BIT) ×2
BIT DRILL SRG 2.7XCANN AO CPLG (BIT) ×2 IMPLANT
BIT DRL SRG 2.7XCANN AO CPLNG (BIT) ×2
BLADE HEX COATED 2.75 (ELECTRODE) ×2 IMPLANT
BLADE SURG 15 STRL LF DISP TIS (BLADE) ×2 IMPLANT
BLADE SURG 15 STRL SS (BLADE) ×2
BNDG COHESIVE 6X5 TAN STRL LF (GAUZE/BANDAGES/DRESSINGS) ×2 IMPLANT
BNDG ESMARK 6X9 LF (GAUZE/BANDAGES/DRESSINGS) ×2
BRUSH SCRUB EZ PLAIN DRY (MISCELLANEOUS) ×2 IMPLANT
CANISTER SUCT 1200ML W/VALVE (MISCELLANEOUS) ×2 IMPLANT
COVER BACK TABLE 60X90IN (DRAPES) ×2 IMPLANT
COVER MAYO STAND STRL (DRAPES) IMPLANT
CUFF TOURNIQUET SINGLE 34IN LL (TOURNIQUET CUFF) ×2 IMPLANT
DECANTER SPIKE VIAL GLASS SM (MISCELLANEOUS) IMPLANT
DRAPE C-ARM 42X72 X-RAY (DRAPES) ×2 IMPLANT
DRAPE C-ARMOR (DRAPES) ×2 IMPLANT
DRAPE EXTREMITY T 121X128X90 (DRAPE) ×2 IMPLANT
DRAPE IMP U-DRAPE 54X76 (DRAPES) ×2 IMPLANT
DRAPE SURG 17X23 STRL (DRAPES) ×4 IMPLANT
DRILL 2.6X122MM WL AO SHAFT (BIT) ×2 IMPLANT
DRSG PAD ABDOMINAL 8X10 ST (GAUZE/BANDAGES/DRESSINGS) ×4 IMPLANT
DURAPREP 26ML APPLICATOR (WOUND CARE) ×2 IMPLANT
ELECT REM PT RETURN 9FT ADLT (ELECTROSURGICAL) ×2
ELECTRODE REM PT RTRN 9FT ADLT (ELECTROSURGICAL) ×1 IMPLANT
GAUZE SPONGE 4X4 12PLY STRL (GAUZE/BANDAGES/DRESSINGS) ×2 IMPLANT
GAUZE XEROFORM 1X8 LF (GAUZE/BANDAGES/DRESSINGS) ×2 IMPLANT
GLOVE SKINSENSE NS SZ7.5 (GLOVE) ×1
GLOVE SKINSENSE STRL SZ7.5 (GLOVE) ×1 IMPLANT
GLOVE SURG SYN 7.5  E (GLOVE) ×1
GLOVE SURG SYN 7.5 E (GLOVE) ×1 IMPLANT
GOWN STRL REIN XL XLG (GOWN DISPOSABLE) ×2 IMPLANT
GOWN STRL REUS W/ TWL LRG LVL3 (GOWN DISPOSABLE) ×1 IMPLANT
GOWN STRL REUS W/TWL LRG LVL3 (GOWN DISPOSABLE) ×1
K-WIRE ORTHOPEDIC 1.4X150L (WIRE) ×4
KWIRE ORTHOPEDIC 1.4X150L (WIRE) ×2 IMPLANT
NEEDLE HYPO 22GX1.5 SAFETY (NEEDLE) IMPLANT
NS IRRIG 1000ML POUR BTL (IV SOLUTION) ×6 IMPLANT
PACK BASIN DAY SURGERY FS (CUSTOM PROCEDURE TRAY) ×2 IMPLANT
PAD CAST 3X4 CTTN HI CHSV (CAST SUPPLIES) IMPLANT
PAD CAST 4YDX4 CTTN HI CHSV (CAST SUPPLIES) ×1 IMPLANT
PADDING CAST COTTON 3X4 STRL (CAST SUPPLIES)
PADDING CAST COTTON 4X4 STRL (CAST SUPPLIES) ×1
PADDING CAST COTTON 6X4 STRL (CAST SUPPLIES) ×2 IMPLANT
PADDING CAST SYN 6 (CAST SUPPLIES) ×1
PADDING CAST SYNTHETIC 4 (CAST SUPPLIES) ×1
PADDING CAST SYNTHETIC 4X4 STR (CAST SUPPLIES) ×1 IMPLANT
PADDING CAST SYNTHETIC 6X4 NS (CAST SUPPLIES) ×1 IMPLANT
PENCIL BUTTON HOLSTER BLD 10FT (ELECTRODE) ×2 IMPLANT
PLATE DISTAL FIBULA 3HOLE (Plate) ×2 IMPLANT
SCREW BONE 14MMX3.5MM (Screw) ×6 IMPLANT
SCREW BONE 18 (Screw) ×2 IMPLANT
SCREW BONE 3.5X20MM (Screw) ×2 IMPLANT
SCREW CANNULATED 4.0X36MM FT (Screw) ×2 IMPLANT
SCREW CANNULATED 4.0X36MM PT (Screw) ×2 IMPLANT
SCREW LOCK 3.5X14 (Screw) ×4 IMPLANT
SCREW LOCKING 3.5X16MM (Screw) ×2 IMPLANT
SHEET MEDIUM DRAPE 40X70 STRL (DRAPES) ×2 IMPLANT
SLEEVE SCD COMPRESS KNEE MED (MISCELLANEOUS) ×2 IMPLANT
SPLINT FIBERGLASS 4X30 (CAST SUPPLIES) IMPLANT
SPONGE LAP 18X18 X RAY DECT (DISPOSABLE) ×2 IMPLANT
SUCTION FRAZIER HANDLE 10FR (MISCELLANEOUS) ×1
SUCTION TUBE FRAZIER 10FR DISP (MISCELLANEOUS) ×1 IMPLANT
SUT ETHILON 3 0 PS 1 (SUTURE) ×4 IMPLANT
SUT VIC AB 0 CT1 27 (SUTURE) ×2
SUT VIC AB 0 CT1 27XBRD ANBCTR (SUTURE) ×2 IMPLANT
SUT VIC AB 2-0 CT1 27 (SUTURE) ×1
SUT VIC AB 2-0 CT1 TAPERPNT 27 (SUTURE) ×1 IMPLANT
SUT VIC AB 3-0 SH 27 (SUTURE)
SUT VIC AB 3-0 SH 27X BRD (SUTURE) IMPLANT
SYR BULB 3OZ (MISCELLANEOUS) ×2 IMPLANT
SYR CONTROL 10ML LL (SYRINGE) IMPLANT
TOWEL OR 17X24 6PK STRL BLUE (TOWEL DISPOSABLE) ×2 IMPLANT
TOWEL OR NON WOVEN STRL DISP B (DISPOSABLE) ×2 IMPLANT
TRAY DSU PREP LF (CUSTOM PROCEDURE TRAY) ×2 IMPLANT
TUBE CONNECTING 20X1/4 (TUBING) ×2 IMPLANT
UNDERPAD 30X30 (UNDERPADS AND DIAPERS) ×2 IMPLANT
YANKAUER SUCT BULB TIP NO VENT (SUCTIONS) ×2 IMPLANT

## 2016-08-07 NOTE — H&P (Signed)
    PREOPERATIVE H&P  Chief Complaint: right trimalleolar ankle fracture  HPI: Colleen Potter is a 51 y.o. female who presents for surgical treatment of right trimalleolar ankle fracture.  She denies any changes in medical history.  Past Medical History:  Diagnosis Date  . Hypertension    states under control with med., has been on med. x 2 yr.  . Trimalleolar fracture of ankle, closed 08/03/2016   right   Past Surgical History:  Procedure Laterality Date  . NO PAST SURGERIES     Social History   Social History  . Marital status: Single    Spouse name: N/A  . Number of children: 2  . Years of education: 12   Occupational History  . [employer]    Social History Main Topics  . Smoking status: Never Smoker  . Smokeless tobacco: Never Used  . Alcohol use No  . Drug use: No  . Sexual activity: Not Asked   Other Topics Concern  . None   Social History Narrative   Fun: Shop.   Denies abuse and feels safe at home.    History reviewed. No pertinent family history. No Known Allergies Prior to Admission medications   Medication Sig Start Date End Date Taking? Authorizing Provider  NIFEdipine (PROCARDIA XL/ADALAT-CC) 90 MG 24 hr tablet Take 1 tablet (90 mg total) by mouth daily. 05/24/16  Yes [address], FNP  oxyCODONE-acetaminophen (PERCOCET) 5-325 MG tablet Take 1 tablet by mouth every 4 (four) hours as needed for moderate pain. [id number]  Yes Delora Fuel, MD     Positive ROS: All other systems have been reviewed and were otherwise negative with the exception of those mentioned in the HPI and as above.  Physical Exam: General: Alert, no acute distress Cardiovascular: No pedal edema Respiratory: No cyanosis, no use of accessory musculature GI: abdomen soft Skin: No lesions in the area of chief complaint Neurologic: Sensation intact distally Psychiatric: Patient is competent for consent with normal mood and affect Lymphatic: no lymphedema  MUSCULOSKELETAL: exam  stable  Assessment: right trimalleolar ankle fracture  Plan: Plan for Procedure(s): OPEN REDUCTION INTERNAL FIXATION (ORIF) RIGHT ANKLE FRACTURE  The risks benefits and alternatives were discussed with the patient including but not limited to the risks of nonoperative treatment, versus surgical intervention including infection, bleeding, nerve injury,  blood clots, cardiopulmonary complications, morbidity, mortality, among others, and they were willing to proceed.   Eduard Roux, MD   08/07/2016 8:29 AM

## 2016-08-07 NOTE — Addendum Note (Signed)
Addended by: Azucena Cecil on: 08/07/2016 08:27 PM   Modules accepted: Level of Service

## 2016-08-07 NOTE — Op Note (Signed)
   Date of Surgery: 08/07/2016  INDICATIONS: Colleen Potter is a 51 y.o.-year-old female who sustained a right ankle fracture; she was indicated for open reduction and internal fixation due to the displaced nature of the articular fracture and came to the operating room today for this procedure. The patient did consent to the procedure after discussion of the risks and benefits.  PREOPERATIVE DIAGNOSIS: right timalleolar ankle fracture  POSTOPERATIVE DIAGNOSIS: Same.  PROCEDURE: Open treatment of right ankle fracture with internal fixation. Trimalleolar w/o fixation of posterior malleolus CPT 27822  SURGEON: N. Eduard Roux, M.D.  ASSIST: Loni Muse, PA student.  ANESTHESIA:  general, regional  TOURNIQUET TIME: 1 hr  IV FLUIDS AND URINE: See anesthesia.  ESTIMATED BLOOD LOSS: minima mL.  IMPLANTS: Stryker Variax 3 hole distal fibula plate  COMPLICATIONS: None.  DESCRIPTION OF PROCEDURE: The patient was brought to the operating room and placed supine on the operating table.  The patient had been signed prior to the procedure and this was documented. The patient had the anesthesia placed by the anesthesiologist.  A nonsterile tourniquet was placed on the upper thigh.  The prep verification and incision time-outs were performed to confirm that this was the correct patient, site, side and location. The patient had an SCD on the opposite lower extremity. The patient did receive antibiotics prior to the incision and was re-dosed during the procedure as needed at indicated intervals.  The patient had the lower extremity prepped and draped in the standard surgical fashion.  The extremity was exsanguinated using an esmarch bandage and the tourniquet was inflated to 300 mm Hg.  A lateral incision was made over the distal fibula. Dissection carried down to the fibula. Subperiosteal elevation was performed. The fracture was exposed. Organized hematoma was removed from the fracture site. The fracture was  then reduced and held provisionally with a tenaculum clamp.. This was confirmed under fluoroscopy. I then placed a lag screw using standard AO technique. I then placed a precontoured plate on the lateral aspect of the fibula at the appropriate position. Locking and nonlocking screws were placed through the plate into the fibula using AO technique. I then turned my attention to the medial malleolus fracture and a separate incision over the medial malleolus was made. Dissection was carried through the soft tissues down to the medial malleolus. The saphenous neurovascular bundle was identified and protected. The fracture was then exposed. Entrapped periosteum was removed. The fracture was then reduced and clamped in place with a tenaculum clamp.  2 parallel K wires were advanced up the medial malleolus using fluoroscopic guidance. I then placed 2 cannulated screws one partially-threaded 1 fully threaded over the K wires. The clamp was removed. The fracture remained reduced. Each screw had excellent purchase. Stress exam of the ankle was stable. The wounds were then thoroughly irrigated and closed in layer fashion using 0 Vicryl, 2-0 Vicryl, 3-0 nylon. Sterile dressings were applied. Patient tolerated procedure well and no immediate competitions.  POSTOPERATIVE PLAN: [name] will remain nonweightbearing on this leg for approximately 6 weeks; [name] will return for suture removal in 2 weeks.  He will be immobilized in a short leg splint and then transitioned to a CAM walker at his first follow up appointment.  [name] will receive DVT prophylaxis based on other medications, activity level, and risk ratio of bleeding to thrombosis.  Colleen Cecil, MD [place] 11:07 AM

## 2016-08-07 NOTE — Progress Notes (Signed)
Assisted Dr. Carignan with right, ultrasound guided, popliteal/saphenous block. Side rails up, monitors on throughout procedure. See vital signs in flow sheet. Tolerated Procedure well. 

## 2016-08-07 NOTE — Anesthesia Procedure Notes (Signed)
Anesthesia Regional Block:  Adductor canal block  Pre-Anesthetic Checklist: ,, timeout performed, Correct Patient, Correct Site, Correct Laterality, Correct Procedure, Correct Position, site marked, Risks and benefits discussed,  Surgical consent,  Pre-op evaluation,  At surgeon's request and post-op pain management  Laterality: Right and Lower  Prep: Maximum Sterile Barrier Precautions used, chloraprep       Needles:  Injection technique: Single-shot  Needle Type: Echogenic Stimulator Needle     Needle Length: 10cm 10 cm Needle Gauge: 21 G    Additional Needles:  Procedures: ultrasound guided (picture in chart) Adductor canal block Narrative:  Start time: 08/07/2016 8:55 AM End time: 08/07/2016 9:00 AM Injection made incrementally with aspirations every 5 mL.  Performed by: Personally  Anesthesiologist: Montez Hageman  Additional Notes: Risks, benefits and alternative to block explained extensively.  Patient tolerated procedure well, without complications.

## 2016-08-07 NOTE — Anesthesia Preprocedure Evaluation (Addendum)
Anesthesia Evaluation  Patient identified by MRN, date of birth, ID band Patient awake    Reviewed: Allergy & Precautions, NPO status , Patient's Chart, lab work & pertinent test results  Airway Mallampati: II  TM Distance: >3 FB Neck ROM: Full    Dental no notable dental hx.    Pulmonary neg pulmonary ROS,    Pulmonary exam normal breath sounds clear to auscultation       Cardiovascular hypertension, Pt. on medications Normal cardiovascular exam Rhythm:Regular Rate:Normal     Neuro/Psych negative neurological ROS  negative psych ROS   GI/Hepatic negative GI ROS, Neg liver ROS,   Endo/Other  negative endocrine ROS  Renal/GU negative Renal ROS  negative genitourinary   Musculoskeletal negative musculoskeletal ROS (+)   Abdominal   Peds negative pediatric ROS (+)  Hematology negative hematology ROS (+)   Anesthesia Other Findings   Reproductive/Obstetrics negative OB ROS                             Anesthesia Physical Anesthesia Plan  ASA: II  Anesthesia Plan: General   Post-op Pain Management:  Regional for Post-op pain   Induction: Intravenous  Airway Management Planned: LMA  Additional Equipment:   Intra-op Plan:   Post-operative Plan: Extubation in OR  Informed Consent: I have reviewed the patients History and Physical, chart, labs and discussed the procedure including the risks, benefits and alternatives for the proposed anesthesia with the patient or authorized representative who has indicated his/her understanding and acceptance.   Dental advisory given  Plan Discussed with: CRNA  Anesthesia Plan Comments: (Popliteal and adductor block)        Anesthesia Quick Evaluation

## 2016-08-07 NOTE — Anesthesia Procedure Notes (Signed)
Anesthesia Regional Block:  Popliteal block  Pre-Anesthetic Checklist: ,, timeout performed, Correct Patient, Correct Site, Correct Laterality, Correct Procedure, Correct Position, site marked, Risks and benefits discussed,  Surgical consent,  Pre-op evaluation,  At surgeon's request and post-op pain management  Laterality: Right and Lower  Prep: Maximum Sterile Barrier Precautions used, chloraprep       Needles:  Injection technique: Single-shot  Needle Type: Echogenic Stimulator Needle     Needle Length: 10cm 10 cm Needle Gauge: 21 G    Additional Needles:  Procedures: ultrasound guided (picture in chart) and nerve stimulator Popliteal block Narrative:  Start time: 08/07/2016 9:01 AM End time: 08/07/2016 9:05 AM Injection made incrementally with aspirations every 5 mL.  Performed by: Personally  Anesthesiologist: Montez Hageman  Additional Notes: Risks, benefits and alternative to block explained extensively.  Patient tolerated procedure well, without complications.

## 2016-08-07 NOTE — Anesthesia Procedure Notes (Signed)
Procedure Name: LMA Insertion Date/Time: 08/07/2016 9:38 AM Performed by: Baxter Flattery Pre-anesthesia Checklist: Patient identified, Emergency Drugs available, Suction available and Patient being monitored Patient Re-evaluated:Patient Re-evaluated prior to inductionOxygen Delivery Method: Circle system utilized Preoxygenation: Pre-oxygenation with 100% oxygen Intubation Type: IV induction Ventilation: Mask ventilation without difficulty LMA: LMA inserted LMA Size: 3.0 Number of attempts: 1 Airway Equipment and Method: Bite block Placement Confirmation: positive ETCO2 and breath sounds checked- equal and bilateral Tube secured with: Tape Dental Injury: Teeth and Oropharynx as per pre-operative assessment

## 2016-08-07 NOTE — Transfer of Care (Signed)
Immediate Anesthesia Transfer of Care Note  Patient: Colleen Potter  Procedure(s) Performed: Procedure(s): OPEN REDUCTION INTERNAL FIXATION (ORIF) RIGHT ANKLE FRACTURE (Right)  Patient Location: PACU  Anesthesia Type:GA combined with regional for post-op pain  Level of Consciousness: awake, alert  and patient cooperative  Airway & Oxygen Therapy: Patient Spontanous Breathing and Patient connected to face mask oxygen  Post-op Assessment: Report given to RN, Post -op Vital signs reviewed and stable and Patient moving all extremities  Post vital signs: Reviewed and stable  Last Vitals:  Vitals:   08/07/16 0905 08/07/16 1115  BP:  112/71  Pulse: 89 85  Resp: 18 (!) 23  Temp:      Last Pain:  Vitals:   08/07/16 0756  TempSrc: Oral  PainSc: 6       Patients Stated Pain Goal: 3 ([id number])  Complications: No apparent anesthesia complications

## 2016-08-07 NOTE — Anesthesia Postprocedure Evaluation (Signed)
Anesthesia Post Note  Patient: Colleen Potter  Procedure(s) Performed: Procedure(s) (LRB): OPEN REDUCTION INTERNAL FIXATION (ORIF) RIGHT ANKLE FRACTURE (Right)  Patient location during evaluation: PACU Anesthesia Type: Regional and General Level of consciousness: awake and alert Pain management: pain level controlled Vital Signs Assessment: post-procedure vital signs reviewed and stable Respiratory status: spontaneous breathing, nonlabored ventilation, respiratory function stable and patient connected to nasal cannula oxygen Cardiovascular status: blood pressure returned to baseline and stable Postop Assessment: no signs of nausea or vomiting Anesthetic complications: no       Last Vitals:  Vitals:   08/07/16 1130 08/07/16 1145  BP: 120/73 120/72  Pulse: 78 73  Resp: (!) 21 15  Temp:      Last Pain:  Vitals:   08/07/16 1145  TempSrc:   PainSc: 0-No pain                 Montez Hageman

## 2016-08-07 NOTE — Discharge Instructions (Signed)
° ° °  1. Keep splint clean and dry 2. Elevate foot above level of the heart 3. Take aspirin to prevent blood clots 4. Take pain meds as needed 5. Strict non weight bearing to operative extremity   Post Anesthesia Home Care Instructions  Activity: Get plenty of rest for the remainder of the day. A responsible adult should stay with you for 24 hours following the procedure.  For the next 24 hours, DO NOT: -Drive a car -Paediatric nurse -Drink alcoholic beverages -Take any medication unless instructed by your physician -Make any legal decisions or sign important papers.  Meals: Start with liquid foods such as gelatin or soup. Progress to regular foods as tolerated. Avoid greasy, spicy, heavy foods. If nausea and/or vomiting occur, drink only clear liquids until the nausea and/or vomiting subsides. Call your physician if vomiting continues.  Special Instructions/Symptoms: Your throat may feel dry or sore from the anesthesia or the breathing tube placed in your throat during surgery. If this causes discomfort, gargle with warm salt water. The discomfort should disappear within 24 hours.  If you had a scopolamine patch placed behind your ear for the management of post- operative nausea and/or vomiting:  1. The medication in the patch is effective for 72 hours, after which it should be removed.  Wrap patch in a tissue and discard in the trash. Wash hands thoroughly with soap and water. 2. You may remove the patch earlier than 72 hours if you experience unpleasant side effects which may include dry mouth, dizziness or visual disturbances. 3. Avoid touching the patch. Wash your hands with soap and water after contact with the patch.   Regional Anesthesia Blocks  1. Numbness or the inability to move the "blocked" extremity may last from 3-48 hours after placement. The length of time depends on the medication injected and your individual response to the medication. If the numbness is not going  away after 48 hours, call your surgeon.  2. The extremity that is blocked will need to be protected until the numbness is gone and the  Strength has returned. Because you cannot feel it, you will need to take extra care to avoid injury. Because it may be weak, you may have difficulty moving it or using it. You may not know what position it is in without looking at it while the block is in effect.  3. For blocks in the legs and feet, returning to weight bearing and walking needs to be done carefully. You will need to wait until the numbness is entirely gone and the strength has returned. You should be able to move your leg and foot normally before you try and bear weight or walk. You will need someone to be with you when you first try to ensure you do not fall and possibly risk injury.  4. Bruising and tenderness at the needle site are common side effects and will resolve in a few days.  5. Persistent numbness or new problems with movement should be communicated to the surgeon or the [place] [phone] [place] [phone]).

## 2016-08-08 ENCOUNTER — Encounter (HOSPITAL_BASED_OUTPATIENT_CLINIC_OR_DEPARTMENT_OTHER): Payer: Self-pay | Admitting: Orthopaedic Surgery

## 2016-08-20 ENCOUNTER — Ambulatory Visit (INDEPENDENT_AMBULATORY_CARE_PROVIDER_SITE_OTHER): Payer: Self-pay

## 2016-08-20 ENCOUNTER — Ambulatory Visit (INDEPENDENT_AMBULATORY_CARE_PROVIDER_SITE_OTHER): Payer: 59 | Admitting: Orthopaedic Surgery

## 2016-08-20 ENCOUNTER — Encounter (INDEPENDENT_AMBULATORY_CARE_PROVIDER_SITE_OTHER): Payer: Self-pay | Admitting: Orthopaedic Surgery

## 2016-08-20 DIAGNOSIS — S82851A Displaced trimalleolar fracture of right lower leg, initial encounter for closed fracture: Secondary | ICD-10-CM

## 2016-08-20 MED ORDER — OXYCODONE-ACETAMINOPHEN 5-325 MG PO TABS: 1.0000 | ORAL_TABLET | Freq: Two times a day (BID) | ORAL | 0 refills | Status: DC | PRN

## 2016-08-20 NOTE — Progress Notes (Signed)
2 week postop visit for ORIF right ankle.  Overall doing well.  No real complaints.  Taking occasional norco for pain.  xrays show stable fixation without complication.  Sutures removed.  CAM walker.  NWB x 4 weeks.  F/u 4 weeks repeat ankle xrays.

## 2016-08-26 ENCOUNTER — Telehealth (INDEPENDENT_AMBULATORY_CARE_PROVIDER_SITE_OTHER): Payer: Self-pay | Admitting: *Deleted

## 2016-08-26 NOTE — Telephone Encounter (Signed)
Pt calling asking for a shower chair. Pt cannot stand in shower.

## 2016-08-27 NOTE — Telephone Encounter (Signed)
Please advise see message below

## 2016-08-27 NOTE — Telephone Encounter (Signed)
yes

## 2016-08-27 NOTE — Telephone Encounter (Signed)
Rx is ready for pick up at the front desk. LMOM

## 2016-09-17 ENCOUNTER — Encounter (INDEPENDENT_AMBULATORY_CARE_PROVIDER_SITE_OTHER): Payer: Self-pay | Admitting: Orthopaedic Surgery

## 2016-09-17 ENCOUNTER — Ambulatory Visit (INDEPENDENT_AMBULATORY_CARE_PROVIDER_SITE_OTHER): Payer: 59 | Admitting: Orthopaedic Surgery

## 2016-09-17 ENCOUNTER — Ambulatory Visit (INDEPENDENT_AMBULATORY_CARE_PROVIDER_SITE_OTHER): Payer: 59

## 2016-09-17 DIAGNOSIS — S82851D Displaced trimalleolar fracture of right lower leg, subsequent encounter for closed fracture with routine healing: Secondary | ICD-10-CM

## 2016-09-17 NOTE — Progress Notes (Signed)
Patient is 6 weeks status post ORIF right trimalleolar ankle fracture. She is doing well. Not taking any pain medicines. X-rays are stable showing signs of healing. At this point we'll vast weight-bear as tolerated in a cam boot. Physical therapy order was made. Follow-up in 6 weeks with repeat 2 view x-rays of the right ankle

## 2016-09-18 ENCOUNTER — Telehealth: Payer: Self-pay | Admitting: *Deleted

## 2016-09-18 DIAGNOSIS — I10 Essential (primary) hypertension: Secondary | ICD-10-CM

## 2016-09-18 MED ORDER — NIFEDIPINE ER OSMOTIC RELEASE 90 MG PO TB24: 90.0000 mg | ORAL_TABLET | Freq: Every day | ORAL | 1 refills | Status: DC

## 2016-09-18 MED FILL — NIFEDIPINE ER 90 MG TABLET: 90 | 90 days supply | Qty: 90 | Fill #0

## 2016-09-18 NOTE — Telephone Encounter (Signed)
Rec'd call pt is needing refill on her procardia sent to [facility] outpatient pharmacy. Sent electronically...Johny Chess

## 2016-10-02 ENCOUNTER — Encounter: Payer: Self-pay | Admitting: Physical Therapy

## 2016-10-02 ENCOUNTER — Ambulatory Visit: Payer: 59 | Attending: Orthopaedic Surgery | Admitting: Physical Therapy

## 2016-10-02 DIAGNOSIS — R2241 Localized swelling, mass and lump, right lower limb: Secondary | ICD-10-CM | POA: Diagnosis not present

## 2016-10-02 DIAGNOSIS — M25671 Stiffness of right ankle, not elsewhere classified: Secondary | ICD-10-CM | POA: Diagnosis not present

## 2016-10-02 DIAGNOSIS — M25571 Pain in right ankle and joints of right foot: Secondary | ICD-10-CM | POA: Diagnosis not present

## 2016-10-02 DIAGNOSIS — R262 Difficulty in walking, not elsewhere classified: Secondary | ICD-10-CM | POA: Diagnosis not present

## 2016-10-02 NOTE — Therapy (Signed)
[place], [place], [address] Phone: [phone]   Fax:  [phone]  Physical Therapy Evaluation  Patient Details  Name: Colleen Potter MRN: [id number] Date of Birth: Jun 22, 1966 Referring Provider: Erlinda Hong  Encounter Date: 10/02/2016      PT End of Session - 10/02/16 1130    Visit Number 1   Date for PT Re-Evaluation 12/02/16   PT Start Time 1324   PT Stop Time 1145   PT Time Calculation (min) 53 min   Activity Tolerance Patient tolerated treatment well   Behavior During Therapy [facility] for tasks assessed/performed      Past Medical History:  Diagnosis Date  . Hypertension    states under control with med., has been on med. x 2 yr.  . Trimalleolar fracture of ankle, closed 08/03/2016   right    Past Surgical History:  Procedure Laterality Date  . NO PAST SURGERIES    . ORIF ANKLE FRACTURE Right 08/07/2016   Procedure: OPEN REDUCTION INTERNAL FIXATION (ORIF) RIGHT ANKLE FRACTURE;  Surgeon: Leandrew Koyanagi, MD;  Location: [place];  Service: Orthopedics;  Laterality: Right;    There were no vitals filed for this visit.       Subjective Assessment - 10/02/16 1056    Subjective Patient reports that she was chasing her granchild on 08/03/16 and stepped down wrong and sustained a trimalleolar fracture of the right ankle.  She underwent an ORIF on 08/07/16.  She was in a cast for about 4 weeks and now in a boot.  She reports that the MD wants her to wean out of the boot by 10/29/16.  She presents with two crutches and a cam boot WBAT   Limitations Walking;Standing;House hold activities   Patient Stated Goals walk without pain   Currently in Pain? Yes   Pain Score 0-No pain   Pain Location Ankle   Pain Orientation Right   Pain Descriptors / Indicators Sore   Pain Type Surgical pain;Acute pain   Pain Onset More than a month ago   Pain Frequency Intermittent   Aggravating Factors   walking and being up on it pain a 3-4/10   Pain Relieving Factors rest and elevation   Effect of Pain on Daily Activities just difficult moving            [facility] PT Assessment - 10/02/16 0001      Assessment   Medical Diagnosis s/p right ankle ORIF   Referring Provider Xu   Onset Date/Surgical Date 08/07/16   Prior Therapy no     Precautions   Precautions None     Balance Screen   Has the patient fallen in the past 6 months Yes   How many times? 1   Has the patient had a decrease in activity level because of a fear of falling?  No   Is the patient reluctant to leave their home because of a fear of falling?  No     Home Environment   Additional Comments stairs into apartment, does housework, has grandchild that is 19 years old     Prior Function   Level of Independence Independent   Vocation Full time employment   Vocation Requirements works at [employer] in [employer], pushing, pulling standing   Leisure no exercise     Observation/Other Assessments-Edema    Edema Circumferential     Circumferential Edema   Circumferential - Right 31 cm at mid  malleoli   Circumferential - Left  27.5     Sensation   Additional Comments intact     ROM / Strength   AROM / PROM / Strength AROM;PROM;Strength     AROM   AROM Assessment Site Ankle   Right/Left Ankle Right   Right Ankle Dorsiflexion -10  10 degrees from neutral   Right Ankle Plantar Flexion 20   Right Ankle Inversion 5   Right Ankle Eversion 0     PROM   PROM Assessment Site Ankle   Right/Left Ankle Right   Right Ankle Dorsiflexion -6  6 degrees from neutral   Right Ankle Plantar Flexion 24  with pain anterior ankle   Right Ankle Inversion 15   Right Ankle Eversion 5     Strength   Overall Strength Comments 3/5 in the range no c/o pain     Palpation   Palpation comment foot is swollen and reddish in color, she is sensitive to touch over the scars but does not report tenderness     Ambulation/Gait    Gait Comments gait with two crutches and boot WBAT, shortened stance phase, with one crtuch she was using in the wrong hand, then without crutches a small hopping type step, slow, antalgic on the right but does not c/o pain                   [facility] Adult PT Treatment/Exercise - 10/02/16 0001      Modalities   Modalities Vasopneumatic     Vasopneumatic   Number Minutes Vasopneumatic  15 minutes   Vasopnuematic Location  Ankle   Vasopneumatic Pressure Medium   Vasopneumatic Temperature  33                PT Education - 10/02/16 1128    Education provided Yes   Education Details ankle ROM, towel scrunches   Person(s) Educated Patient   Methods Explanation;Demonstration;Handout   Comprehension Verbalized understanding;Verbal cues required          PT Short Term Goals - 10/02/16 1132      PT SHORT TERM GOAL #1   Title independent with initial HEP   Time 1   Period Weeks   Status New           PT Long Term Goals - 10/02/16 1133      PT LONG TERM GOAL #1   Title decrease swelling by 2cm   Time 8   Period Weeks   Status New     PT LONG TERM GOAL #2   Title walk without device and boot x 500 feet   Time 8   Period Weeks   Status New     PT LONG TERM GOAL #3   Title increase right ankle DF to 10 degrees   Time 8   Period Weeks   Status New     PT LONG TERM GOAL #4   Title increase strength to 4+/5   Time 8   Period Weeks   Status New     PT LONG TERM GOAL #5   Title go up and down stairs step over step   Time 8   Period Weeks   Status New               Plan - 10/02/16 1130    Clinical Impression Statement Patient with a right ankle trimalleolar fracture with ORIF on 08/07/16, she was in a cast, now in boot, using crutches, WBAT, MD  told her to wean out of the boot.  She has significant swelling, poor gait with decreased stance and step length.  Has poor ROM 10 degrees from neutral for DF.  Does not c/o much pain   Rehab  Potential Good   PT Frequency 2x / week   PT Duration 8 weeks   PT Treatment/Interventions ADLs/Self Care Home Management;Electrical Stimulation;Cryotherapy;Gait training;Stair training;Functional mobility training;Patient/family education;Balance training;Therapeutic exercise;Therapeutic activities;Manual techniques;Vasopneumatic Device   PT Next Visit Plan work on gait, ROM and function, address edema   Consulted and Agree with Plan of Care Patient      Patient will benefit from skilled therapeutic intervention in order to improve the following deficits and impairments:  Abnormal gait, Decreased activity tolerance, Decreased balance, Decreased mobility, Decreased strength, Increased edema, Decreased scar mobility, Pain, Decreased coordination, Difficulty walking, Decreased range of motion  Visit Diagnosis: Pain in right ankle and joints of right foot - Plan: PT plan of care cert/re-cert  Stiffness of right ankle, not elsewhere classified - Plan: PT plan of care cert/re-cert  Difficulty in walking, not elsewhere classified - Plan: PT plan of care cert/re-cert  Localized swelling, mass and lump, right lower limb - Plan: PT plan of care cert/re-cert     Problem List Patient Active Problem List   Diagnosis Date Noted  . Displaced trimalleolar fracture of right lower leg, initial encounter for closed fracture 08/06/2016  . Essential hypertension 10/16/2015    Sumner Boast., PT 10/02/2016, 11:36 AM  [address] [place], [place], [address] Phone: [phone]   Fax:  [phone]  Name: Colleen Potter MRN: [id number] Date of Birth: 10-30-1965

## 2016-10-07 ENCOUNTER — Ambulatory Visit: Payer: 59 | Admitting: Physical Therapy

## 2016-10-07 ENCOUNTER — Encounter: Payer: Self-pay | Admitting: Physical Therapy

## 2016-10-07 DIAGNOSIS — R2241 Localized swelling, mass and lump, right lower limb: Secondary | ICD-10-CM | POA: Diagnosis not present

## 2016-10-07 DIAGNOSIS — M25671 Stiffness of right ankle, not elsewhere classified: Secondary | ICD-10-CM | POA: Diagnosis not present

## 2016-10-07 DIAGNOSIS — M25571 Pain in right ankle and joints of right foot: Secondary | ICD-10-CM | POA: Diagnosis not present

## 2016-10-07 DIAGNOSIS — R262 Difficulty in walking, not elsewhere classified: Secondary | ICD-10-CM

## 2016-10-07 NOTE — Therapy (Signed)
[place], [place], [address] Phone: [phone]   Fax:  [phone]  Physical Therapy Treatment  Patient Details  Name: Colleen Potter MRN: [id number] Date of Birth: 1965-10-11 Referring Provider: Erlinda Hong  Encounter Date: 10/07/2016      PT End of Session - 10/07/16 1049    Visit Number 2   Date for PT Re-Evaluation 12/02/16   PT Start Time 1005   PT Stop Time 1101   PT Time Calculation (min) 56 min   Activity Tolerance Patient tolerated treatment well   Behavior During Therapy [facility] for tasks assessed/performed      Past Medical History:  Diagnosis Date  . Hypertension    states under control with med., has been on med. x 2 yr.  . Trimalleolar fracture of ankle, closed 08/03/2016   right    Past Surgical History:  Procedure Laterality Date  . NO PAST SURGERIES    . ORIF ANKLE FRACTURE Right 08/07/2016   Procedure: OPEN REDUCTION INTERNAL FIXATION (ORIF) RIGHT ANKLE FRACTURE;  Surgeon: Leandrew Koyanagi, MD;  Location: [place];  Service: Orthopedics;  Laterality: Right;    There were no vitals filed for this visit.      Subjective Assessment - 10/07/16 1008    Subjective Patient reports that she has been using the crutch and the boot only in the AM, she reports that she has been walking without crtuches and without the boot for most of the day.  Reports "feeling better"   Currently in Pain? No/denies                         [facility] Adult PT Treatment/Exercise - 10/07/16 0001      High Level Balance   High Level Balance Activities Tandem walking   High Level Balance Comments resisted gait all directions, had some pain going to her left, when pushing off with the right foot     Exercises   Exercises Ankle     Modalities   Modalities Vasopneumatic     Vasopneumatic   Number Minutes Vasopneumatic  15 minutes   Vasopnuematic Location  Ankle   Vasopneumatic  Pressure Medium   Vasopneumatic Temperature  33     Ankle Exercises: Stretches   Gastroc Stretch 20 seconds;3 reps     Ankle Exercises: Aerobic   Tread Mill NuStep Level 5 x 6 minutes     Ankle Exercises: Machines for Strengthening   Cybex Leg Press 20# 2x15, 20# calf press 2x15, 10# leg extension, 25# leg curls 2x15 each     Ankle Exercises: Seated   Other Seated Ankle Exercises sit fit ankle motions all x 15 each   Other Seated Ankle Exercises tband red all ankle motions 2x15 each                  PT Short Term Goals - 10/07/16 1051      PT SHORT TERM GOAL #1   Title independent with initial HEP   Status Partially Met           PT Long Term Goals - 10/02/16 1133      PT LONG TERM GOAL #1   Title decrease swelling by 2cm   Time 8   Period Weeks   Status New     PT LONG TERM GOAL #2   Title walk without device and boot x 500 feet   Time  8   Period Weeks   Status New     PT LONG TERM GOAL #3   Title increase right ankle DF to 10 degrees   Time 8   Period Weeks   Status New     PT LONG TERM GOAL #4   Title increase strength to 4+/5   Time 8   Period Weeks   Status New     PT LONG TERM GOAL #5   Title go up and down stairs step over step   Time 8   Period Weeks   Status New               Plan - 10/07/16 1050    Clinical Impression Statement Patient doing well without crutch and without boot, shortened stance and toe off phase.  Had some pain with side stepping to the left and with gastroc stretch   PT Next Visit Plan work on gait, ROM and function, address edema   Consulted and Agree with Plan of Care Patient      Patient will benefit from skilled therapeutic intervention in order to improve the following deficits and impairments:  Abnormal gait, Decreased activity tolerance, Decreased balance, Decreased mobility, Decreased strength, Increased edema, Decreased scar mobility, Pain, Decreased coordination, Difficulty walking, Decreased  range of motion  Visit Diagnosis: Pain in right ankle and joints of right foot  Stiffness of right ankle, not elsewhere classified  Difficulty in walking, not elsewhere classified  Localized swelling, mass and lump, right lower limb     Problem List Patient Active Problem List   Diagnosis Date Noted  . Displaced trimalleolar fracture of right lower leg, initial encounter for closed fracture 08/06/2016  . Essential hypertension 10/16/2015    Sumner Boast., PT 10/07/2016, 10:52 AM  [address] [place], [place], [address] Phone: [phone]   Fax:  [phone]  Name: Colleen Potter MRN: [id number] Date of Birth: [birth year]

## 2016-10-09 ENCOUNTER — Encounter: Payer: Self-pay | Admitting: Physical Therapy

## 2016-10-09 ENCOUNTER — Ambulatory Visit: Payer: 59 | Admitting: Physical Therapy

## 2016-10-09 DIAGNOSIS — R262 Difficulty in walking, not elsewhere classified: Secondary | ICD-10-CM | POA: Diagnosis not present

## 2016-10-09 DIAGNOSIS — R2241 Localized swelling, mass and lump, right lower limb: Secondary | ICD-10-CM

## 2016-10-09 DIAGNOSIS — M25671 Stiffness of right ankle, not elsewhere classified: Secondary | ICD-10-CM | POA: Diagnosis not present

## 2016-10-09 DIAGNOSIS — M25571 Pain in right ankle and joints of right foot: Secondary | ICD-10-CM

## 2016-10-09 NOTE — Therapy (Signed)
[place], [place], [address] Phone: [phone]   Fax:  [phone]  Physical Therapy Treatment  Patient Details  Name: Colleen Potter MRN: [id number] Date of Birth: 01/17/1966 Referring Provider: Erlinda Hong  Encounter Date: 10/09/2016      PT End of Session - 10/09/16 1143    Visit Number 3   Date for PT Re-Evaluation 12/02/16   PT Start Time 1100   PT Stop Time 1200   PT Time Calculation (min) 60 min   Activity Tolerance Patient tolerated treatment well   Behavior During Therapy [facility] for tasks assessed/performed      Past Medical History:  Diagnosis Date  . Hypertension    states under control with med., has been on med. x 2 yr.  . Trimalleolar fracture of ankle, closed 08/03/2016   right    Past Surgical History:  Procedure Laterality Date  . NO PAST SURGERIES    . ORIF ANKLE FRACTURE Right 08/07/2016   Procedure: OPEN REDUCTION INTERNAL FIXATION (ORIF) RIGHT ANKLE FRACTURE;  Surgeon: Leandrew Koyanagi, MD;  Location: [place];  Service: Orthopedics;  Laterality: Right;    There were no vitals filed for this visit.      Subjective Assessment - 10/09/16 1059    Subjective Pt reports that things are going well   Currently in Pain? No/denies   Pain Score 0-No pain                         [facility] Adult PT Treatment/Exercise - 10/09/16 0001      High Level Balance   High Level Balance Comments resisted gait all directions with pulley 10l x4 each     Exercises   Exercises Ankle     Modalities   Modalities Vasopneumatic     Vasopneumatic   Number Minutes Vasopneumatic  15 minutes   Vasopnuematic Location  Ankle   Vasopneumatic Pressure Medium   Vasopneumatic Temperature  33     Ankle Exercises: Stretches   Gastroc Stretch 20 seconds;3 reps     Ankle Exercises: Aerobic   Tread Mill NuStep Level 5 x 6 minutes     Ankle Exercises: Machines for  Strengthening   Cybex Leg Press 20# 2x15, 20# calf press 2x15, 10# leg extension, 25# leg curls 2x15 each     Ankle Exercises: Seated   Other Seated Ankle Exercises sit fit ankle motions all x 15 each   Other Seated Ankle Exercises tband green all ankle motions 2x15 each                  PT Short Term Goals - 10/07/16 1051      PT SHORT TERM GOAL #1   Title independent with initial HEP   Status Partially Met           PT Long Term Goals - 10/02/16 1133      PT LONG TERM GOAL #1   Title decrease swelling by 2cm   Time 8   Period Weeks   Status New     PT LONG TERM GOAL #2   Title walk without device and boot x 500 feet   Time 8   Period Weeks   Status New     PT LONG TERM GOAL #3   Title increase right ankle DF to 10 degrees   Time 8   Period Weeks   Status New  PT LONG TERM GOAL #4   Title increase strength to 4+/5   Time 8   Period Weeks   Status New     PT LONG TERM GOAL #5   Title go up and down stairs step over step   Time 8   Period Weeks   Status New               Plan - 10/09/16 1144    Clinical Impression Statement Pt enters clinic without crutch or boot, shorten stance and to phase remains. Reports a better tolerance with resisted gait. Some pain with gastroc phase.   Rehab Potential Good   PT Frequency 2x / week   PT Duration 8 weeks   PT Treatment/Interventions ADLs/Self Care Home Management;Electrical Stimulation;Cryotherapy;Gait training;Stair training;Functional mobility training;Patient/family education;Balance training;Therapeutic exercise;Therapeutic activities;Manual techniques;Vasopneumatic Device   PT Next Visit Plan work on gait, ROM and function, address edema      Patient will benefit from skilled therapeutic intervention in order to improve the following deficits and impairments:  Abnormal gait, Decreased activity tolerance, Decreased balance, Decreased mobility, Decreased strength, Increased edema, Decreased  scar mobility, Pain, Decreased coordination, Difficulty walking, Decreased range of motion  Visit Diagnosis: Pain in right ankle and joints of right foot  Stiffness of right ankle, not elsewhere classified  Localized swelling, mass and lump, right lower limb  Difficulty in walking, not elsewhere classified     Problem List Patient Active Problem List   Diagnosis Date Noted  . Displaced trimalleolar fracture of right lower leg, initial encounter for closed fracture 08/06/2016  . Essential hypertension 10/16/2015    Scot Jun, PTA 10/09/2016, 11:45 AM  [place] [address] [address] [place], [place], [address] Phone: [phone]   Fax:  [phone]  Name: Colleen Potter MRN: [id number] Date of Birth: [birth year]

## 2016-10-16 ENCOUNTER — Ambulatory Visit: Payer: 59 | Attending: Orthopaedic Surgery | Admitting: Physical Therapy

## 2016-10-16 ENCOUNTER — Encounter: Payer: Self-pay | Admitting: Physical Therapy

## 2016-10-16 DIAGNOSIS — R2241 Localized swelling, mass and lump, right lower limb: Secondary | ICD-10-CM | POA: Diagnosis not present

## 2016-10-16 DIAGNOSIS — M25671 Stiffness of right ankle, not elsewhere classified: Secondary | ICD-10-CM | POA: Diagnosis not present

## 2016-10-16 DIAGNOSIS — R262 Difficulty in walking, not elsewhere classified: Secondary | ICD-10-CM | POA: Insufficient documentation

## 2016-10-16 DIAGNOSIS — M25571 Pain in right ankle and joints of right foot: Secondary | ICD-10-CM | POA: Diagnosis not present

## 2016-10-16 NOTE — Therapy (Signed)
[place], [place], [address] Phone: [phone]   Fax:  [phone]  Physical Therapy Treatment  Patient Details  Name: Colleen Potter MRN: [id number] Date of Birth: 12-Apr-1966 Referring Provider: Erlinda Hong  Encounter Date: 10/16/2016      PT End of Session - 10/16/16 1229    Visit Number 4   Date for PT Re-Evaluation 12/02/16   PT Start Time 1157   PT Stop Time 1242   PT Time Calculation (min) 57 min   Activity Tolerance Patient tolerated treatment well   Behavior During Therapy [facility] for tasks assessed/performed      Past Medical History:  Diagnosis Date  . Hypertension    states under control with med., has been on med. x 2 yr.  . Trimalleolar fracture of ankle, closed 08/03/2016   right    Past Surgical History:  Procedure Laterality Date  . NO PAST SURGERIES    . ORIF ANKLE FRACTURE Right 08/07/2016   Procedure: OPEN REDUCTION INTERNAL FIXATION (ORIF) RIGHT ANKLE FRACTURE;  Surgeon: Leandrew Koyanagi, MD;  Location: [place];  Service: Orthopedics;  Laterality: Right;    There were no vitals filed for this visit.      Subjective Assessment - 10/16/16 1143    Subjective Pt reports that she is doing all right   Currently in Pain? No/denies   Pain Score 0-No pain                         [facility] Adult PT Treatment/Exercise - 10/16/16 0001      Exercises   Exercises Ankle     Modalities   Modalities Vasopneumatic     Vasopneumatic   Number Minutes Vasopneumatic  15 minutes   Vasopnuematic Location  Ankle   Vasopneumatic Pressure Medium   Vasopneumatic Temperature  33     Manual Therapy   Manual Therapy Passive ROM   Passive ROM R ankle all directions     Ankle Exercises: Aerobic   Stationary Bike L0 x5 min   Tread Mill NuStep Level 5 x 6 minutes     Ankle Exercises: Standing   Heel Raises 20 reps  x2 on airex   Toe Raise 20 reps  x2 on airex   Other Standing Ankle Exercises Step downs 2in step 2x10      Ankle Exercises: Seated   Other Seated Ankle Exercises standing sit fit ankle motions all x 15 each   Other Seated Ankle Exercises tband blue all ankle motions 2x20 each     Ankle Exercises: Machines for Strengthening   Cybex Leg Press 20# 2x15, 20#  10# leg extension, 25# leg curls 2x15 each                  PT Short Term Goals - 10/07/16 1051      PT SHORT TERM GOAL #1   Title independent with initial HEP   Status Partially Met           PT Long Term Goals - 10/02/16 1133      PT LONG TERM GOAL #1   Title decrease swelling by 2cm   Time 8   Period Weeks   Status New     PT LONG TERM GOAL #2   Title walk without device and boot x 500 feet   Time 8   Period Weeks   Status New  PT LONG TERM GOAL #3   Title increase right ankle DF to 10 degrees   Time 8   Period Weeks   Status New     PT LONG TERM GOAL #4   Title increase strength to 4+/5   Time 8   Period Weeks   Status New     PT LONG TERM GOAL #5   Title go up and down stairs step over step   Time 8   Period Weeks   Status New               Plan - 10/16/16 1230    Clinical Impression Statement Pt with a noticeable limp when walking in clinic. Cues needed with sit fit ROM to keep knees still to achieve better ankle motion. Therapist assisted with step does to keep R heel down, pt reports pain with this. Cues to relax during R ankle PROM,.   Rehab Potential Good   PT Frequency 2x / week   PT Duration 8 weeks   PT Treatment/Interventions ADLs/Self Care Home Management;Electrical Stimulation;Cryotherapy;Gait training;Stair training;Functional mobility training;Patient/family education;Balance training;Therapeutic exercise;Therapeutic activities;Manual techniques;Vasopneumatic Device   PT Next Visit Plan work on gait, ROM and function, address edema      Patient will benefit from skilled therapeutic intervention in order to  improve the following deficits and impairments:  Abnormal gait, Decreased activity tolerance, Decreased balance, Decreased mobility, Decreased strength, Increased edema, Decreased scar mobility, Pain, Decreased coordination, Difficulty walking, Decreased range of motion  Visit Diagnosis: Pain in right ankle and joints of right foot  Stiffness of right ankle, not elsewhere classified  Localized swelling, mass and lump, right lower limb  Difficulty in walking, not elsewhere classified     Problem List Patient Active Problem List   Diagnosis Date Noted  . Displaced trimalleolar fracture of right lower leg, initial encounter for closed fracture 08/06/2016  . Essential hypertension 10/16/2015    Scot Jun, PTA 10/16/2016, 12:33 PM  [address] [place], [place], [address] Phone: [phone]   Fax:  [phone]  Name: Colleen Potter MRN: [id number] Date of Birth: Jun 10, 1966

## 2016-10-18 ENCOUNTER — Ambulatory Visit: Payer: 59 | Admitting: Rehabilitation

## 2016-10-18 ENCOUNTER — Encounter: Payer: Self-pay | Admitting: Rehabilitation

## 2016-10-18 DIAGNOSIS — M25671 Stiffness of right ankle, not elsewhere classified: Secondary | ICD-10-CM | POA: Diagnosis not present

## 2016-10-18 DIAGNOSIS — R262 Difficulty in walking, not elsewhere classified: Secondary | ICD-10-CM

## 2016-10-18 DIAGNOSIS — R2241 Localized swelling, mass and lump, right lower limb: Secondary | ICD-10-CM | POA: Diagnosis not present

## 2016-10-18 DIAGNOSIS — M25571 Pain in right ankle and joints of right foot: Secondary | ICD-10-CM

## 2016-10-18 NOTE — Therapy (Signed)
[place], [place], [address] Phone: [phone]   Fax:  [phone]  Physical Therapy Treatment  Patient Details  Name: Colleen Potter MRN: [id number] Date of Birth: September 22, 1965 Referring Provider: Erlinda Hong  Encounter Date: 10/18/2016      PT End of Session - 10/18/16 1152    Visit Number 5   Date for PT Re-Evaluation 12/02/16   PT Start Time 1100   PT Stop Time 1103   PT Time Calculation (min) 56 min   Activity Tolerance Patient tolerated treatment well      Past Medical History:  Diagnosis Date  . Hypertension    states under control with med., has been on med. x 2 yr.  . Trimalleolar fracture of ankle, closed 08/03/2016   right    Past Surgical History:  Procedure Laterality Date  . NO PAST SURGERIES    . ORIF ANKLE FRACTURE Right 08/07/2016   Procedure: OPEN REDUCTION INTERNAL FIXATION (ORIF) RIGHT ANKLE FRACTURE;  Surgeon: Leandrew Koyanagi, MD;  Location: [place];  Service: Orthopedics;  Laterality: Right;    There were no vitals filed for this visit.      Subjective Assessment - 10/18/16 1101    Subjective still walking with a limp and concerned about the swelling   Currently in Pain? No/denies                         [facility] Adult PT Treatment/Exercise - 10/18/16 0001      Vasopneumatic   Number Minutes Vasopneumatic  15 minutes   Vasopnuematic Location  Ankle   Vasopneumatic Pressure Medium   Vasopneumatic Temperature  33     Manual Therapy   Manual therapy comments PROM; manual stretches; scar mobilization in elevation     Ankle Exercises: Aerobic   Stationary Bike L0 x5 min   Tread Mill gait level 1 x 6mn with vcs to work on heel/toe     Ankle Exercises: Standing   Heel Raises 20 reps  with toe raise   Toe Raise 20 reps  on airex   Other Standing Ankle Exercises on foam marching x 30"     Ankle Exercises: Seated   Other Seated Ankle  Exercises tband blue all ankle motions 2x20 each     Ankle Exercises: Stretches   Gastroc Stretch 20 seconds;3 reps     Ankle Exercises: Machines for Strengthening   Cybex Leg Press DL press 20# 2x10                  PT Short Term Goals - 10/07/16 1051      PT SHORT TERM GOAL #1   Title independent with initial HEP   Status Partially Met           PT Long Term Goals - 10/02/16 1133      PT LONG TERM GOAL #1   Title decrease swelling by 2cm   Time 8   Period Weeks   Status New     PT LONG TERM GOAL #2   Title walk without device and boot x 500 feet   Time 8   Period Weeks   Status New     PT LONG TERM GOAL #3   Title increase right ankle DF to 10 degrees   Time 8   Period Weeks   Status New     PT LONG TERM GOAL #4  Title increase strength to 4+/5   Time 8   Period Weeks   Status New     PT LONG TERM GOAL #5   Title go up and down stairs step over step   Time 8   Period Weeks   Status New               Plan - 10/18/16 1153    Clinical Impression Statement Able to decrease limp after manual work and TM work with cueing on heel to toe and upright trunk.  Although return of limp post treatment.  edema mild despite appearance.  improving ROM   PT Next Visit Plan cont ankle POC, gait, ROM,       Patient will benefit from skilled therapeutic intervention in order to improve the following deficits and impairments:     Visit Diagnosis: Pain in right ankle and joints of right foot  Stiffness of right ankle, not elsewhere classified  Localized swelling, mass and lump, right lower limb  Difficulty in walking, not elsewhere classified     Problem List Patient Active Problem List   Diagnosis Date Noted  . Displaced trimalleolar fracture of right lower leg, initial encounter for closed fracture 08/06/2016  . Essential hypertension 10/16/2015    Stark Bray DPT, CMP 10/18/2016, 11:56 AM  [place] [address] [place], [place], [address] Phone: [phone]   Fax:  [phone]  Name: Colleen Potter MRN: [id number] Date of Birth: 12/02/1965

## 2016-10-23 ENCOUNTER — Ambulatory Visit: Payer: 59 | Admitting: Physical Therapy

## 2016-10-23 DIAGNOSIS — M25571 Pain in right ankle and joints of right foot: Secondary | ICD-10-CM | POA: Diagnosis not present

## 2016-10-23 DIAGNOSIS — R2241 Localized swelling, mass and lump, right lower limb: Secondary | ICD-10-CM | POA: Diagnosis not present

## 2016-10-23 DIAGNOSIS — M25671 Stiffness of right ankle, not elsewhere classified: Secondary | ICD-10-CM

## 2016-10-23 DIAGNOSIS — R262 Difficulty in walking, not elsewhere classified: Secondary | ICD-10-CM

## 2016-10-23 NOTE — Therapy (Signed)
[place], [place], [address] Phone: [phone]   Fax:  [phone]  Physical Therapy Treatment  Patient Details  Name: Colleen Potter MRN: [id number] Date of Birth: 21-May-1966 Referring Provider: Erlinda Hong   Encounter Date: 10/23/2016      PT End of Session - 10/23/16 1702    Visit Number 6   Date for PT Re-Evaluation 12/02/16   PT Start Time 1700   PT Stop Time 1754   PT Time Calculation (min) 54 min   Activity Tolerance Patient tolerated treatment well      Past Medical History:  Diagnosis Date  . Hypertension    states under control with med., has been on med. x 2 yr.  . Trimalleolar fracture of ankle, closed 08/03/2016   right    Past Surgical History:  Procedure Laterality Date  . NO PAST SURGERIES    . ORIF ANKLE FRACTURE Right 08/07/2016   Procedure: OPEN REDUCTION INTERNAL FIXATION (ORIF) RIGHT ANKLE FRACTURE;  Surgeon: Leandrew Koyanagi, MD;  Location: [facility];  Service: Orthopedics;  Laterality: Right;    There were no vitals filed for this visit.      Subjective Assessment - 10/23/16 1701    Subjective Pt. reporting she still has occasional random shooting pain which quickly resolves.   Patient Stated Goals walk without pain   Currently in Pain? No/denies   Pain Score 0-No pain   Multiple Pain Sites No            [facility] PT Assessment - 10/23/16 1707      Assessment   Medical Diagnosis s/p right ankle ORIF   Referring Provider Erlinda Hong    Next MD Visit 10/29/16                     [facility] Adult PT Treatment/Exercise - 10/23/16 1709      Exercises   Exercises Knee/Hip     Knee/Hip Exercises: Standing   Forward Lunges Right;Left;1 set;15 reps   Forward Lunges Limitations onto BOSU ball (up); 1 UE support; DF focus    Forward Step Up Right;Left;1 set;Step Height: 6"   Forward Step Up Limitations x 10 step overs leading with L down; focusing on  dorsiflexion    Step Down Right;10 reps;Step Height: 6"   Step Down Limitations heel tap; 1 UE support    Other Standing Knee Exercises Standing forward, lateral toe clears to 15" with 3# around ankles x 20 reps each way; focusing on DF    Other Standing Knee Exercises Standing monster walk, side-stepping (focusing on dorsiflexion) with red TB around ankles 2 x 20 ft each way      Vasopneumatic   Number Minutes Vasopneumatic  15 minutes   Vasopnuematic Location  Ankle  R   Vasopneumatic Pressure Medium   Vasopneumatic Temperature  33     Ankle Exercises: Machines for Strengthening   Cybex Leg Press DL press 20# 2 x 20     Ankle Exercises: Aerobic   Stationary Bike L0 x 5 min     Ankle Exercises: Standing   Heel Raises 20 reps  on airex pad    Toe Raise 20 reps  on airex pad    Other Standing Ankle Exercises on foam marching x 30"     Ankle Exercises: Stretches   Gastroc Stretch 20 seconds;3 reps  standing leaning on wall  PT Short Term Goals - 10/07/16 1051      PT SHORT TERM GOAL #1   Title independent with initial HEP   Status Partially Met           PT Long Term Goals - 10/23/16 1702      PT LONG TERM GOAL #1   Title decrease swelling by 2cm   Time 8   Period Weeks   Status On-going     PT LONG TERM GOAL #2   Title walk without device and boot x 500 feet   Time 8   Period Weeks   Status On-going     PT LONG TERM GOAL #3   Title increase right ankle DF to 10 degrees   Time 8   Period Weeks   Status On-going     PT LONG TERM GOAL #4   Title increase strength to 4+/5   Time 8   Period Weeks   Status On-going     PT LONG TERM GOAL #5   Title go up and down stairs step over step   Time 8   Period Weeks   Status On-going               Plan - 10/23/16 1741    Clinical Impression Statement Pt. tolerating therex well today focusing on hip/knee sterngthening activity with active DF stretch.  lunge onto BOSU, 6"  heel touch, linear 8" step over today with constant cueing to avoid ER at hip and DF.  Pt. with some R ankle irritation following therex thus ice/compression to end treatment to decrease post-exercise swelling edema and pain.     PT Treatment/Interventions ADLs/Self Care Home Management;Electrical Stimulation;Cryotherapy;Gait training;Stair training;Functional mobility training;Patient/family education;Balance training;Therapeutic exercise;Therapeutic activities;Manual techniques;Vasopneumatic Device   PT Next Visit Plan cont ankle POC, gait, ROM,       Patient will benefit from skilled therapeutic intervention in order to improve the following deficits and impairments:  Abnormal gait, Decreased activity tolerance, Decreased balance, Decreased mobility, Decreased strength, Increased edema, Decreased scar mobility, Pain, Decreased coordination, Difficulty walking, Decreased range of motion  Visit Diagnosis: Pain in right ankle and joints of right foot  Stiffness of right ankle, not elsewhere classified  Localized swelling, mass and lump, right lower limb  Difficulty in walking, not elsewhere classified     Problem List Patient Active Problem List   Diagnosis Date Noted  . Displaced trimalleolar fracture of right lower leg, initial encounter for closed fracture 08/06/2016  . Essential hypertension 10/16/2015    Bess Harvest, PTA 10/23/16 5:47 PM  [place] [address] [place], [place], [address] Phone: [phone]   Fax:  [phone]  Name: Colleen Potter MRN: [id number] Date of Birth: 04/21/1966

## 2016-10-25 ENCOUNTER — Ambulatory Visit: Payer: 59 | Admitting: Physical Therapy

## 2016-10-25 ENCOUNTER — Encounter: Payer: Self-pay | Admitting: Physical Therapy

## 2016-10-25 DIAGNOSIS — M25571 Pain in right ankle and joints of right foot: Secondary | ICD-10-CM | POA: Diagnosis not present

## 2016-10-25 DIAGNOSIS — R2241 Localized swelling, mass and lump, right lower limb: Secondary | ICD-10-CM

## 2016-10-25 DIAGNOSIS — M25671 Stiffness of right ankle, not elsewhere classified: Secondary | ICD-10-CM | POA: Diagnosis not present

## 2016-10-25 DIAGNOSIS — R262 Difficulty in walking, not elsewhere classified: Secondary | ICD-10-CM | POA: Diagnosis not present

## 2016-10-25 NOTE — Therapy (Signed)
[place], [place], [address] Phone: [phone]   Fax:  [phone]  Physical Therapy Treatment  Patient Details  Name: Colleen Potter MRN: [id number] Date of Birth: Oct 04, 1965 Referring Provider: Erlinda Hong   Encounter Date: 10/25/2016      PT End of Session - 10/25/16 1056    Visit Number 7   Date for PT Re-Evaluation 12/02/16   PT Start Time 1020   PT Stop Time 1105   PT Time Calculation (min) 45 min   Activity Tolerance Patient tolerated treatment well   Behavior During Therapy [facility] for tasks assessed/performed      Past Medical History:  Diagnosis Date  . Hypertension    states under control with med., has been on med. x 2 yr.  . Trimalleolar fracture of ankle, closed 08/03/2016   right    Past Surgical History:  Procedure Laterality Date  . NO PAST SURGERIES    . ORIF ANKLE FRACTURE Right 08/07/2016   Procedure: OPEN REDUCTION INTERNAL FIXATION (ORIF) RIGHT ANKLE FRACTURE;  Surgeon: Leandrew Koyanagi, MD;  Location: [place];  Service: Orthopedics;  Laterality: Right;    There were no vitals filed for this visit.      Subjective Assessment - 10/25/16 1020    Subjective Pt reporting that she doesn't have any pain just stiffness.    Currently in Pain? No/denies                         [facility] Adult PT Treatment/Exercise - 10/25/16 0001      Vasopneumatic   Number Minutes Vasopneumatic  15 minutes   Vasopnuematic Location  Ankle   Vasopneumatic Pressure Medium   Vasopneumatic Temperature  36     Manual Therapy   Manual Therapy Passive ROM   Manual therapy comments hold at end range     Ankle Exercises: Aerobic   Stationary Bike L1 x 6 min     Ankle Exercises: Standing   Heel Raises 20 reps   Toe Raise 20 reps   Other Standing Ankle Exercises Up and down stairwell x 3    Other Standing Ankle Exercises Step over foam roll with heel touch R only  3x10;heel to toe and side to side weight shifting on sit fit 2 x 10      Ankle Exercises: Stretches   Gastroc Stretch 20 seconds;3 reps                  PT Short Term Goals - 10/07/16 1051      PT SHORT TERM GOAL #1   Title independent with initial HEP   Status Partially Met           PT Long Term Goals - 10/23/16 1702      PT LONG TERM GOAL #1   Title decrease swelling by 2cm   Time 8   Period Weeks   Status On-going     PT LONG TERM GOAL #2   Title walk without device and boot x 500 feet   Time 8   Period Weeks   Status On-going     PT LONG TERM GOAL #3   Title increase right ankle DF to 10 degrees   Time 8   Period Weeks   Status On-going     PT LONG TERM GOAL #4   Title increase strength to 4+/5   Time 8   Period  Weeks   Status On-going     PT LONG TERM GOAL #5   Title go up and down stairs step over step   Time 8   Period Weeks   Status On-going               Plan - 10/25/16 1057    Clinical Impression Statement Pt stated that her main goal is to be able to go down stairs normally without pain. She said that she practices at home she lives on the 3rd floor of an apartment complex, If she puts her right foot halfway off the stair while stepping down with the left then she has no pain, but if her whole foot is on the stair then that brings her pain. The PROM showed she was very tight and she rather do that than any of the machines because thats what loosens her ankle up. Still needs some cues to go heel toe while walking, she concentrates a lot on it if she catches herself.    PT Next Visit Plan ROM and descending down the stairs, flexibility up through gastroc.       Patient will benefit from skilled therapeutic intervention in order to improve the following deficits and impairments:  Abnormal gait, Decreased activity tolerance, Decreased balance, Decreased mobility, Decreased strength, Increased edema, Decreased scar mobility, Pain,  Decreased coordination, Difficulty walking, Decreased range of motion  Visit Diagnosis: Pain in right ankle and joints of right foot  Stiffness of right ankle, not elsewhere classified  Localized swelling, mass and lump, right lower limb  Difficulty in walking, not elsewhere classified     Problem List Patient Active Problem List   Diagnosis Date Noted  . Displaced trimalleolar fracture of right lower leg, initial encounter for closed fracture 08/06/2016  . Essential hypertension 10/16/2015   Alan Mulder SPTA Deeric Cruise,ANGIE PTA 10/25/2016, 11:04 AM  [place], [place], [address] Phone: [phone]   Fax:  [phone]  Name: Colleen Potter MRN: [id number] Date of Birth: Nov 28, 1965

## 2016-10-28 ENCOUNTER — Encounter: Payer: Self-pay | Admitting: Rehabilitation

## 2016-10-28 ENCOUNTER — Ambulatory Visit: Payer: 59 | Admitting: Rehabilitation

## 2016-10-28 DIAGNOSIS — M25671 Stiffness of right ankle, not elsewhere classified: Secondary | ICD-10-CM

## 2016-10-28 DIAGNOSIS — M25571 Pain in right ankle and joints of right foot: Secondary | ICD-10-CM | POA: Diagnosis not present

## 2016-10-28 DIAGNOSIS — R2241 Localized swelling, mass and lump, right lower limb: Secondary | ICD-10-CM | POA: Diagnosis not present

## 2016-10-28 DIAGNOSIS — R262 Difficulty in walking, not elsewhere classified: Secondary | ICD-10-CM

## 2016-10-28 NOTE — Therapy (Signed)
[place], [place], [address] Phone: [phone]   Fax:  [phone]  Physical Therapy Treatment  Patient Details  Name: Colleen Potter MRN: [id number] Date of Birth: [birth year] Referring Provider: Erlinda Hong   Encounter Date: 10/28/2016      PT End of Session - 10/28/16 1149    Visit Number 8   Date for PT Re-Evaluation 12/02/16   PT Start Time 0932   PT Stop Time 1025   PT Time Calculation (min) 53 min   Activity Tolerance Patient tolerated treatment well      Past Medical History:  Diagnosis Date  . Hypertension    states under control with med., has been on med. x 2 yr.  . Trimalleolar fracture of ankle, closed 08/03/2016   right    Past Surgical History:  Procedure Laterality Date  . NO PAST SURGERIES    . ORIF ANKLE FRACTURE Right 08/07/2016   Procedure: OPEN REDUCTION INTERNAL FIXATION (ORIF) RIGHT ANKLE FRACTURE;  Surgeon: Leandrew Koyanagi, MD;  Location: [place];  Service: Orthopedics;  Laterality: Right;    There were no vitals filed for this visit.          [facility] PT Assessment - 10/28/16 0001      AROM   Right Ankle Dorsiflexion 4   Right Ankle Plantar Flexion 27     PROM   Right Ankle Dorsiflexion 8   Right Ankle Plantar Flexion 30     Strength   Overall Strength Comments 4+/5                      [facility] Adult PT Treatment/Exercise - 10/28/16 0001      Ambulation/Gait   Gait Comments stairs x 4 with work on descent pre and post gastroc/soleus stair stretches; 1 rail     Knee/Hip Exercises: Standing   Other Standing Knee Exercises marching on Airex x 60"     Vasopneumatic   Number Minutes Vasopneumatic  15 minutes   Vasopnuematic Location  Ankle   Vasopneumatic Pressure Medium   Vasopneumatic Temperature  33     Manual Therapy   Manual Therapy Joint mobilization   Manual therapy comments STM in elevation distal to proximal to  decrease edema, PROM to tolerance, scar mobilization   Joint Mobilization curvilinear APs grade IV 3x20", TC PA in DF EOR on towel grade IV 3x20", MWM into forward lunge x 10 to tolerance     Ankle Exercises: Standing   BAPS Sitting;Standing;Level 3  F/B, Inv/Ev, C/CC x 10 each   SLS at chair 4x max on ground and airex                  PT Short Term Goals - 10/07/16 1051      PT SHORT TERM GOAL #1   Title independent with initial HEP   Status Partially Met           PT Long Term Goals - 10/28/16 1152      PT LONG TERM GOAL #2   Title walk without device and boot x 500 feet   Status Achieved     PT LONG TERM GOAL #3   Title increase right ankle DF to 10 degrees   Status Partially Met     PT LONG TERM GOAL #4   Title increase strength to 4+/5   Status Achieved     PT LONG TERM GOAL #5  Title go up and down stairs step over step   Status Partially Met               Plan - 10/28/16 1149    Clinical Impression Statement Focused on DF ROM and joint mob to improve DF for improving stair descent. ROM limitations seem to be more heel cord tightness in nature  and not joint restriction.  Added BAPs with difficulty with plantarflexion and eversion combined movements.   Rehab Potential Good   PT Frequency 2x / week   PT Duration 8 weeks   PT Next Visit Plan cont POC for ankle strength, SL balance, and ROM, gait;   cleared for driving by MD?   Consulted and Agree with Plan of Care Patient      Patient will benefit from skilled therapeutic intervention in order to improve the following deficits and impairments:  Abnormal gait, Decreased activity tolerance, Decreased balance, Decreased mobility, Decreased strength, Increased edema, Decreased scar mobility, Pain, Decreased coordination, Difficulty walking, Decreased range of motion  Visit Diagnosis: Pain in right ankle and joints of right foot  Stiffness of right ankle, not elsewhere classified  Localized  swelling, mass and lump, right lower limb  Difficulty in walking, not elsewhere classified     Problem List Patient Active Problem List   Diagnosis Date Noted  . Displaced trimalleolar fracture of right lower leg, initial encounter for closed fracture 08/06/2016  . Essential hypertension 10/16/2015    Stark Bray, DPT, CMP 10/28/2016, 11:54 AM  [place] [address] [place], [place], [address] Phone: [phone]   Fax:  [phone]  Name: Zakiya Sporrer MRN: [id number] Date of Birth: 1965-11-14

## 2016-10-29 ENCOUNTER — Ambulatory Visit (INDEPENDENT_AMBULATORY_CARE_PROVIDER_SITE_OTHER): Payer: 59 | Admitting: Orthopaedic Surgery

## 2016-10-29 ENCOUNTER — Ambulatory Visit (INDEPENDENT_AMBULATORY_CARE_PROVIDER_SITE_OTHER): Payer: 59

## 2016-10-29 ENCOUNTER — Encounter (INDEPENDENT_AMBULATORY_CARE_PROVIDER_SITE_OTHER): Payer: Self-pay | Admitting: Orthopaedic Surgery

## 2016-10-29 DIAGNOSIS — S82851A Displaced trimalleolar fracture of right lower leg, initial encounter for closed fracture: Secondary | ICD-10-CM

## 2016-10-29 NOTE — Progress Notes (Signed)
Patient is 3 months s/p ORIF right ankle.  Doing well.  Denies pain.  Wants to go back to work.  Progressing with PT.  xrays show healed fractures.  No complications.  Mild swelling of ankle.  At this point, may return back to work.  She will let us know if she needs reduced hours but she'll try 40 hrs/week for now.  May do 6 more weeks of PT at once a week plus HEP.  f/u prn.

## 2016-10-30 ENCOUNTER — Encounter: Payer: Self-pay | Admitting: Physical Therapy

## 2016-10-30 ENCOUNTER — Ambulatory Visit: Payer: 59 | Admitting: Physical Therapy

## 2016-10-30 DIAGNOSIS — M25571 Pain in right ankle and joints of right foot: Secondary | ICD-10-CM | POA: Diagnosis not present

## 2016-10-30 DIAGNOSIS — R262 Difficulty in walking, not elsewhere classified: Secondary | ICD-10-CM

## 2016-10-30 DIAGNOSIS — R2241 Localized swelling, mass and lump, right lower limb: Secondary | ICD-10-CM | POA: Diagnosis not present

## 2016-10-30 DIAGNOSIS — M25671 Stiffness of right ankle, not elsewhere classified: Secondary | ICD-10-CM | POA: Diagnosis not present

## 2016-10-30 NOTE — Therapy (Signed)
[place], [place], [address] Phone: [phone]   Fax:  [phone]  Physical Therapy Treatment  Patient Details  Name: Valory Wetherby MRN: [id number] Date of Birth: [birth year] Referring Provider: Erlinda Hong   Encounter Date: 10/30/2016      PT End of Session - 10/30/16 1053    Visit Number 9   Date for PT Re-Evaluation 12/02/16   PT Start Time 1008   PT Stop Time 1102   PT Time Calculation (min) 54 min   Activity Tolerance Patient tolerated treatment well   Behavior During Therapy [facility] for tasks assessed/performed      Past Medical History:  Diagnosis Date  . Hypertension    states under control with med., has been on med. x 2 yr.  . Trimalleolar fracture of ankle, closed 08/03/2016   right    Past Surgical History:  Procedure Laterality Date  . NO PAST SURGERIES    . ORIF ANKLE FRACTURE Right 08/07/2016   Procedure: OPEN REDUCTION INTERNAL FIXATION (ORIF) RIGHT ANKLE FRACTURE;  Surgeon: Leandrew Koyanagi, MD;  Location: [place];  Service: Orthopedics;  Laterality: Right;    There were no vitals filed for this visit.      Subjective Assessment - 10/30/16 1009    Subjective Pt reports that she isn't in any pain. No problems after last treatment. Little stiffness    Limitations Walking;Standing;House hold activities   Patient Stated Goals walk without pain   Currently in Pain? No/denies   Pain Score 0-No pain                         [facility] Adult PT Treatment/Exercise - 10/30/16 0001      High Level Balance   High Level Balance Activities --  SLS on Airex 5x 15 secs    High Level Balance Comments assist with chair hold      Knee/Hip Exercises: Standing   Other Standing Knee Exercises Step downs 2in box   focus on dorsiflexion on right ankle     Manual Therapy   Manual Therapy Passive ROM   Manual therapy comments held at end range      Ankle Exercises:  Aerobic   Stationary Bike L1 x 6 min     Ankle Exercises: Standing   Heel Raises 20 reps  2 sets    Toe Raise 20 reps   Other Standing Ankle Exercises Up and down stairwell x 3    Other Standing Ankle Exercises Dynadisc/ all motions 10 reps     Ankle Exercises: Seated   Other Seated Ankle Exercises tband blue all ankle motions 2x20 each                  PT Short Term Goals - 10/30/16 1100      PT SHORT TERM GOAL #1   Title independent with initial HEP   Time 1   Period Weeks   Status Partially Met           PT Long Term Goals - 10/30/16 1059      PT LONG TERM GOAL #1   Title decrease swelling by 2cm   Time 8   Period Weeks   Status On-going     PT LONG TERM GOAL #2   Title walk without device and boot x 500 feet   Time 8   Period Weeks   Status Achieved  PT LONG TERM GOAL #3   Title increase right ankle DF to 10 degrees   Time 8   Period Weeks   Status Partially Met     PT LONG TERM GOAL #4   Title increase strength to 4+/5   Time 8   Period Weeks   Status Achieved     PT LONG TERM GOAL #5   Title go up and down stairs step over step   Time 8   Period Weeks   Status Partially Met               Plan - 10/30/16 1053    Clinical Impression Statement Continued with DF ROM/strenghening to help with stair descent. Pt. was able to complete all exercises. Attempted step downs with 4inch box and complained of increase pain. Pt was not able to do it so switched to 2inch box. Pt still complained of pain and had limited knee flexion on right during step down.  Pt had difficulty during SLS holding on to chair.    Rehab Potential Good   PT Frequency 2x / week   PT Duration 8 weeks   PT Treatment/Interventions ADLs/Self Care Home Management;Electrical Stimulation;Cryotherapy;Gait training;Stair training;Functional mobility training;Patient/family education;Balance training;Therapeutic exercise;Therapeutic activities;Manual  techniques;Vasopneumatic Device   PT Next Visit Plan cont POC for ankle strength, SL balance, and ROM, gait;   cleared for driving by MD?   Consulted and Agree with Plan of Care Patient      Patient will benefit from skilled therapeutic intervention in order to improve the following deficits and impairments:  Abnormal gait, Decreased activity tolerance, Decreased balance, Decreased mobility, Decreased strength, Increased edema, Decreased scar mobility, Pain, Decreased coordination, Difficulty walking, Decreased range of motion  Visit Diagnosis: Pain in right ankle and joints of right foot  Stiffness of right ankle, not elsewhere classified  Localized swelling, mass and lump, right lower limb  Difficulty in walking, not elsewhere classified     Problem List Patient Active Problem List   Diagnosis Date Noted  . Displaced trimalleolar fracture of right lower leg, initial encounter for closed fracture 08/06/2016  . Essential hypertension 10/16/2015    Octavia Bruckner 10/30/2016, 11:53 AM  [place] [address] [place], [place], [address] Phone: [phone]   Fax:  [phone]  Name: Ettie Krontz MRN: [id number] Date of Birth: 25-Feb-1966

## 2016-11-04 ENCOUNTER — Ambulatory Visit: Payer: 59 | Admitting: Physical Therapy

## 2016-11-12 ENCOUNTER — Encounter: Payer: Self-pay | Admitting: Physical Therapy

## 2016-11-12 ENCOUNTER — Ambulatory Visit: Payer: 59 | Attending: Orthopaedic Surgery | Admitting: Physical Therapy

## 2016-11-12 DIAGNOSIS — R262 Difficulty in walking, not elsewhere classified: Secondary | ICD-10-CM | POA: Insufficient documentation

## 2016-11-12 DIAGNOSIS — M25571 Pain in right ankle and joints of right foot: Secondary | ICD-10-CM | POA: Diagnosis not present

## 2016-11-12 DIAGNOSIS — R2241 Localized swelling, mass and lump, right lower limb: Secondary | ICD-10-CM | POA: Diagnosis not present

## 2016-11-12 DIAGNOSIS — M25671 Stiffness of right ankle, not elsewhere classified: Secondary | ICD-10-CM | POA: Insufficient documentation

## 2016-11-12 NOTE — Therapy (Addendum)
[address] [place], [place], [address] Phone: [phone]   Fax:  [phone]  Physical Therapy Treatment  Patient Details  Name: Colleen Potter MRN: [id number] Date of Birth: 07/30/1965 Referring Provider: Erlinda Hong   Encounter Date: 11/12/2016      PT End of Session - 11/12/16 1112    Visit Number 10   Date for PT Re-Evaluation 12/02/16   PT Start Time 2518   PT Stop Time 1057   PT Time Calculation (min) 42 min   Activity Tolerance Patient tolerated treatment well   Behavior During Therapy [facility] for tasks assessed/performed      Past Medical History:  Diagnosis Date  . Hypertension    states under control with med., has been on med. x 2 yr.  . Trimalleolar fracture of ankle, closed 08/03/2016   right    Past Surgical History:  Procedure Laterality Date  . NO PAST SURGERIES    . ORIF ANKLE FRACTURE Right 08/07/2016   Procedure: OPEN REDUCTION INTERNAL FIXATION (ORIF) RIGHT ANKLE FRACTURE;  Surgeon: Leandrew Koyanagi, MD;  Location[facility];  Service: Orthopedics;  Laterality: Right;    There were no vitals filed for this visit.      Subjective Assessment - 11/12/16 1015    Subjective Pt reports no pain and no problems since last visit.    Currently in Pain? No/denies   Pain Score 0-No pain            [facility] PT Assessment - 11/12/16 0001      AROM   Right Ankle Dorsiflexion 9                     [facility] Adult PT Treatment/Exercise - 11/12/16 0001      Ambulation/Gait   Gait Comments Stairs step over step x2      Manual Therapy   Manual Therapy Passive ROM   Manual therapy comments held at end range      Ankle Exercises: Aerobic   Stationary Bike L1 x 6 min     Ankle Exercises: Standing   SLS SLS 3x 30 secs  HHAx1 first set, No HHA last 2 sets    Heel Raises 15 reps  on airex cushion   Toe Raise 15 reps  on airex cushion   Heel Walk (Round Trip) 14f x2   Toe Walk (Round Trip) 267fx2    Other Standing Ankle Exercises Step downs, 4in box 2x10   with left foot, Right foot stationary   Other Standing Ankle Exercises Dynadisc CW/CCW 2x15     Ankle Exercises: Stretches   Gastroc Stretch 20 seconds;3 reps                  PT Short Term Goals - 10/30/16 1100      PT SHORT TERM GOAL #1   Title independent with initial HEP   Time 1   Period Weeks   Status Partially Met           PT Long Term Goals - 11/12/16 1116      PT LONG TERM GOAL #3   Title increase right ankle DF to 10 degrees   Status Partially Met     PT LONG TERM GOAL #5   Title go up and down stairs step over step   Status Achieved               Plan -  11/12/16 1113    Clinical Impression Statement Pt was able to complete all exercises today. Pt reported that she has went back to work so she has been on her feet more. Step downs on 4 inch box caused increaesd tightness no increase in pain. Dynadisc going CCW caused some pain and tightness as wel. Pt DF has increased to 9 degrees and pt was able to go step over step  on stairs with pain rated at a 3. Still has some difficulty with SLS on RLE   PT Frequency 2x / week   PT Duration 8 weeks   PT Treatment/Interventions ADLs/Self Care Home Management;Electrical Stimulation;Cryotherapy;Gait training;Stair training;Functional mobility training;Patient/family education;Balance training;Therapeutic exercise;Therapeutic activities;Manual techniques;Vasopneumatic Device   PT Next Visit Plan Continue ankle strengthening,ROM and SL balance      Patient will benefit from skilled therapeutic intervention in order to improve the following deficits and impairments:     Visit Diagnosis: Pain in right ankle and joints of right foot  Stiffness of right ankle, not elsewhere classified  Localized swelling, mass and lump, right lower limb  Difficulty in walking, not elsewhere classified     Problem List Patient  Active Problem List   Diagnosis Date Noted  . Displaced trimalleolar fracture of right lower leg, initial encounter for closed fracture 08/06/2016  . Essential hypertension 10/16/2015   PHYSICAL THERAPY DISCHARGE SUMMARY  Visits from Start of Care: 9 Plan: Patient agrees to discharge.  Patient goals were not met. Patient is being discharged due to being pleased with the current functional level.  ?????       Octavia Bruckner 11/12/2016, 11:18 AM  [address] [place], [place], [address] Phone: [phone]   Fax:  [phone]  Name: Colleen Potter MRN: [id number] Date of Birth: August 11, 1965

## 2016-11-21 ENCOUNTER — Ambulatory Visit: Payer: 59 | Admitting: Physical Therapy

## 2016-12-27 MED FILL — NIFEDIPINE ER 90 MG TABLET: 90 | 90 days supply | Qty: 90 | Fill #1

## 2017-02-24 ENCOUNTER — Telehealth (INDEPENDENT_AMBULATORY_CARE_PROVIDER_SITE_OTHER): Payer: Self-pay | Admitting: Orthopaedic Surgery

## 2017-02-24 NOTE — Telephone Encounter (Signed)
Is this okay if so, for how long?

## 2017-02-24 NOTE — Telephone Encounter (Signed)
Patient called needing a new handicap Placard. The number to contact patient is [phone]

## 2017-02-26 NOTE — Telephone Encounter (Signed)
Called pt to advise her that form is ready for pickup no answer [facility]

## 2017-02-26 NOTE — Telephone Encounter (Signed)
Yes 6 months

## 2017-04-04 ENCOUNTER — Telehealth: Payer: Self-pay | Admitting: Family

## 2017-04-04 NOTE — Telephone Encounter (Signed)
LVM informing patient to call back and sch appointment

## 2017-04-04 NOTE — Telephone Encounter (Signed)
Pt called and would like a refill of her NIFEdipine (PROCARDIA XL/ADALAT-CC) 90 MG 24 hr tablet  Please advise

## 2017-04-04 NOTE — Telephone Encounter (Signed)
Pt is overdue for appt has not seen Greg since 02/2016. Per chart pt has not taken med since 10/29/2016. Will need to make appt for refills...Colleen Potter

## 2017-04-10 ENCOUNTER — Encounter: Payer: Self-pay | Admitting: Family

## 2017-04-10 ENCOUNTER — Other Ambulatory Visit (INDEPENDENT_AMBULATORY_CARE_PROVIDER_SITE_OTHER): Payer: 59

## 2017-04-10 ENCOUNTER — Ambulatory Visit (INDEPENDENT_AMBULATORY_CARE_PROVIDER_SITE_OTHER): Payer: 59 | Admitting: Family

## 2017-04-10 DIAGNOSIS — I10 Essential (primary) hypertension: Secondary | ICD-10-CM | POA: Diagnosis not present

## 2017-04-10 LAB — COMPREHENSIVE METABOLIC PANEL
ALT: 7 U/L (ref 0–35)
AST: 10 U/L (ref 0–37)
Albumin: 4.4 g/dL (ref 3.5–5.2)
Alkaline Phosphatase: 55 U/L (ref 39–117)
BUN: 13 mg/dL (ref 6–23)
CHLORIDE: 102 meq/L (ref 96–112)
CO2: 29 mEq/L (ref 19–32)
Calcium: 9.6 mg/dL (ref 8.4–10.5)
Creatinine, Ser: 0.95 mg/dL (ref 0.40–1.20)
GFR: 79.79 mL/min (ref >60.00)
GLUCOSE: 95 mg/dL (ref 70–99)
POTASSIUM: 3.3 meq/L — AB (ref 3.5–5.1)
SODIUM: 139 meq/L (ref 135–145)
Total Bilirubin: 0.5 mg/dL (ref 0.2–1.2)
Total Protein: 8.1 g/dL (ref 6.0–8.3)

## 2017-04-10 MED ORDER — NIFEDIPINE ER OSMOTIC RELEASE 90 MG PO TB24: 90.0000 mg | ORAL_TABLET | Freq: Every day | ORAL | 3 refills | Status: DC

## 2017-04-10 MED FILL — NIFEDIPINE ER 90 MG TABLET: 90 | 90 days supply | Qty: 90 | Fill #0

## 2017-04-10 NOTE — Assessment & Plan Note (Signed)
Blood pressure well-controlled and below goal 140/90 with current medication regimen although elevated today secondary to running out of medication. Restart nifedipine. Encouraged to monitor blood pressure at home and follow low-sodium diet. Denies worse headache of life with no new symptoms of end organ damage noted on physical exam.

## 2017-04-10 NOTE — Progress Notes (Signed)
Subjective:    Patient ID: Colleen Potter, female    DOB: 05-02-1966, 51 y.o.   MRN: [id number]  Chief Complaint  Patient presents with  . Medication Refill    BP medication    HPI:  Colleen Potter is a 51 y.o. female who  has a past medical history of Hypertension and Trimalleolar fracture of ankle, closed (08/03/2016). and presents today for a follow up office visit.  Hypertension - Currently maintained on nifedipine. Reports that she has been out of the medication for about 1.5 weeks. When taking the medications as prescribed and denies adverse side effects or hypotensive readings. Blood pressures at home have been well controlled. Denies changes in vision, worst headache of life or new symptoms of end organ damage. Not currently following a low sodium diet.   BP Readings from Last 3 Encounters:  04/10/17 (!) 164/100  08/07/16 133/75  08/04/16 151/88     No Known Allergies    Outpatient Medications Prior to Visit  Medication Sig Dispense Refill  . NIFEdipine (PROCARDIA XL/ADALAT-CC) 90 MG 24 hr tablet Take 1 tablet (90 mg total) by mouth daily. 90 tablet 1  . aspirin EC 325 MG tablet Take 1 tablet (325 mg total) by mouth 2 (two) times daily. (Patient not taking: Reported on 10/29/2016) 84 tablet 0  . methocarbamol (ROBAXIN) 750 MG tablet Take 1 tablet (750 mg total) by mouth 2 (two) times daily as needed for muscle spasms. (Patient not taking: Reported on 10/29/2016) 60 tablet 0  . ondansetron (ZOFRAN) 4 MG tablet Take 1-2 tablets (4-8 mg total) by mouth every 8 (eight) hours as needed for nausea or vomiting. (Patient not taking: Reported on 10/29/2016) 40 tablet 0  . oxyCODONE (OXYCONTIN) 10 mg 12 hr tablet Take 1 tablet (10 mg total) by mouth every 12 (twelve) hours. (Patient not taking: Reported on 09/17/2016) 10 tablet 0  . oxyCODONE-acetaminophen (PERCOCET) 5-325 MG tablet Take 1-2 tablets by mouth every 4 (four) hours as needed for severe pain. (Patient not taking: Reported on  09/17/2016) 90 tablet 0  . oxyCODONE-acetaminophen (PERCOCET) 5-325 MG tablet Take 1 tablet by mouth 2 (two) times daily as needed for moderate pain. (Patient not taking: Reported on 10/29/2016) 20 tablet 0  . promethazine (PHENERGAN) 25 MG tablet Take 1 tablet (25 mg total) by mouth every 6 (six) hours as needed for nausea. (Patient not taking: Reported on 10/29/2016) 30 tablet 1  . senna-docusate (SENOKOT S) 8.6-50 MG tablet Take 1 tablet by mouth at bedtime as needed. (Patient not taking: Reported on 10/29/2016) 30 tablet 1   No facility-administered medications prior to visit.     Past Medical History:  Diagnosis Date  . Hypertension    states under control with med., has been on med. x 2 yr.  . Trimalleolar fracture of ankle, closed 08/03/2016   right      Review of Systems  Constitutional: Negative for chills and fever.  Eyes:       Negative for changes in vision  Respiratory: Negative for cough, chest tightness and wheezing.   Cardiovascular: Negative for chest pain, palpitations and leg swelling.  Neurological: Negative for dizziness, weakness and light-headedness.      Objective:    BP (!) 164/100 (BP Location: Left Arm, Patient Position: Sitting, Cuff Size: Large)   Pulse 83   Temp 99.2 F (37.3 C) (Oral)   Resp 16   Ht 5' 7"  (1.702 m)   Wt 195 lb (88.5 kg)   SpO2  98%   BMI 30.54 kg/m  Nursing note and vital signs reviewed.  Physical Exam  Constitutional: She is oriented to person, place, and time. She appears well-developed and well-nourished. No distress.  Cardiovascular: Normal rate, regular rhythm, normal heart sounds and intact distal pulses.   Pulmonary/Chest: Effort normal and breath sounds normal.  Neurological: She is alert and oriented to person, place, and time.  Skin: Skin is warm and dry.  Psychiatric: She has a normal mood and affect. Her behavior is normal. Judgment and thought content normal.       Assessment & Plan:   Problem List Items  Addressed This Visit      Cardiovascular and Mediastinum   Essential hypertension    Blood pressure well-controlled and below goal 140/90 with current medication regimen although elevated today secondary to running out of medication. Restart nifedipine. Encouraged to monitor blood pressure at home and follow low-sodium diet. Denies worse headache of life with no new symptoms of end organ damage noted on physical exam.      Relevant Medications   NIFEdipine (PROCARDIA XL/ADALAT-CC) 90 MG 24 hr tablet   Other Relevant Orders   Comp Met (CMET)       I have discontinued Ms. Dufresne's aspirin EC, methocarbamol, ondansetron, oxyCODONE, oxyCODONE-acetaminophen, promethazine, senna-docusate, and oxyCODONE-acetaminophen. I am also having her maintain her NIFEdipine.   Meds ordered this encounter  Medications  . NIFEdipine (PROCARDIA XL/ADALAT-CC) 90 MG 24 hr tablet    Sig: Take 1 tablet (90 mg total) by mouth daily.    Dispense:  90 tablet    Refill:  3    Order Specific Question:   Supervising Provider    Answer:   Pricilla Holm A [[id number]]     Follow-up: Return in about 1 year (around 04/10/2018), or if symptoms worsen or fail to improve.  Mauricio Po, FNP

## 2017-04-10 NOTE — Patient Instructions (Signed)
Thank you for choosing [facility].  SUMMARY AND INSTRUCTIONS:  Please continue to take your medications as prescribed.  Monitor your blood pressure at home and follow a low sodium diet.  Follow up within 1 year for an appointment with a new provider.  Medication:  Your prescription(s) have been submitted to your pharmacy or been printed and provided for you. Please take as directed and contact our office if you believe you are having problem(s) with the medication(s) or have any questions.  Follow up:  If your symptoms worsen or fail to improve, please contact our office for further instruction, or in case of emergency go directly to the emergency room at the closest medical facility.

## 2017-04-20 IMAGING — CR DG ANKLE COMPLETE 3+V*R*
3 series · 3 of 3 positions shown · non-contrast
Comparison: None.

CLINICAL DATA: Fall down steps tonight. Right ankle pain and
deformity. Initial encounter.

EXAM:
RIGHT ANKLE - COMPLETE 3+ VIEW

[x ankle ap right (1 of 2)]
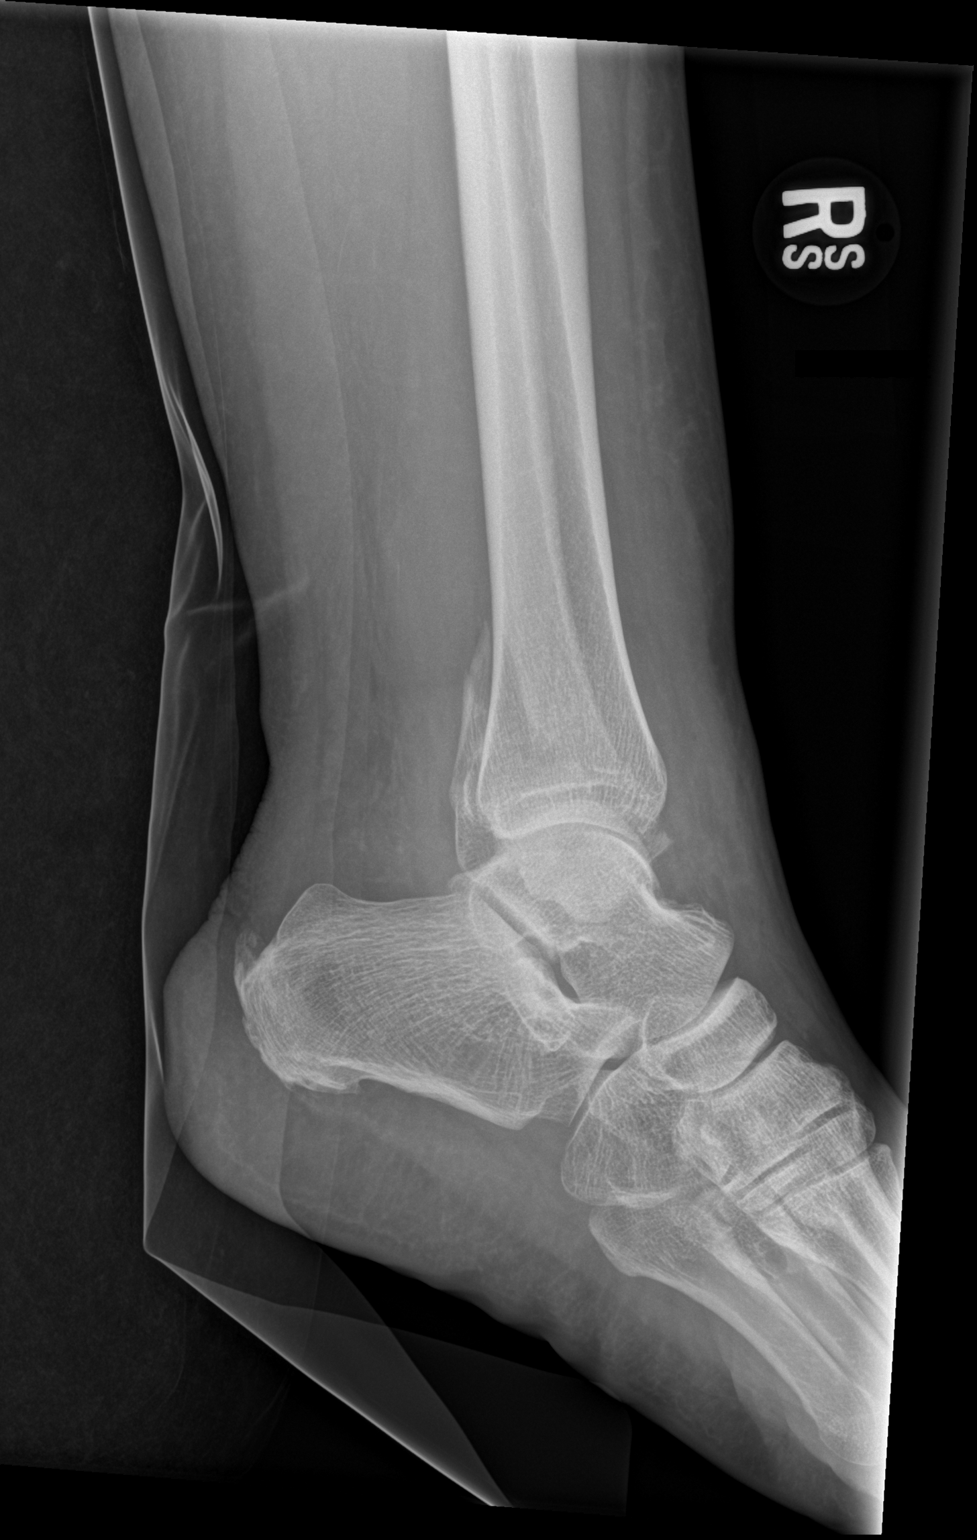

[x ankle ap right (2 of 2)]
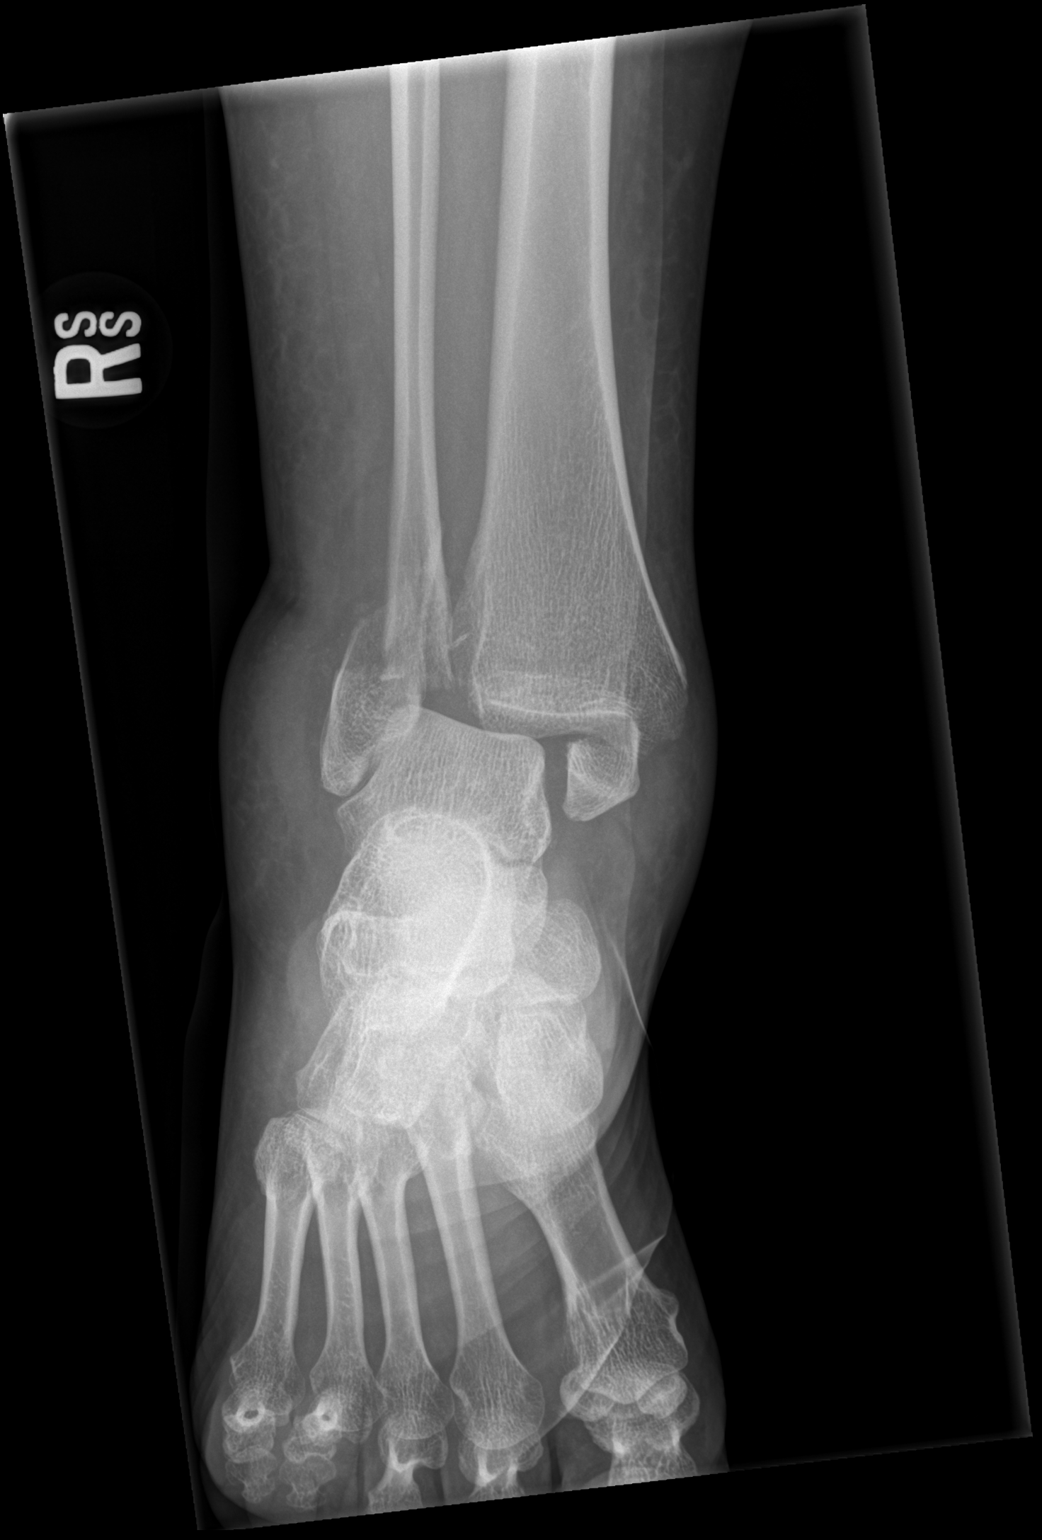

[x ankle obl right]
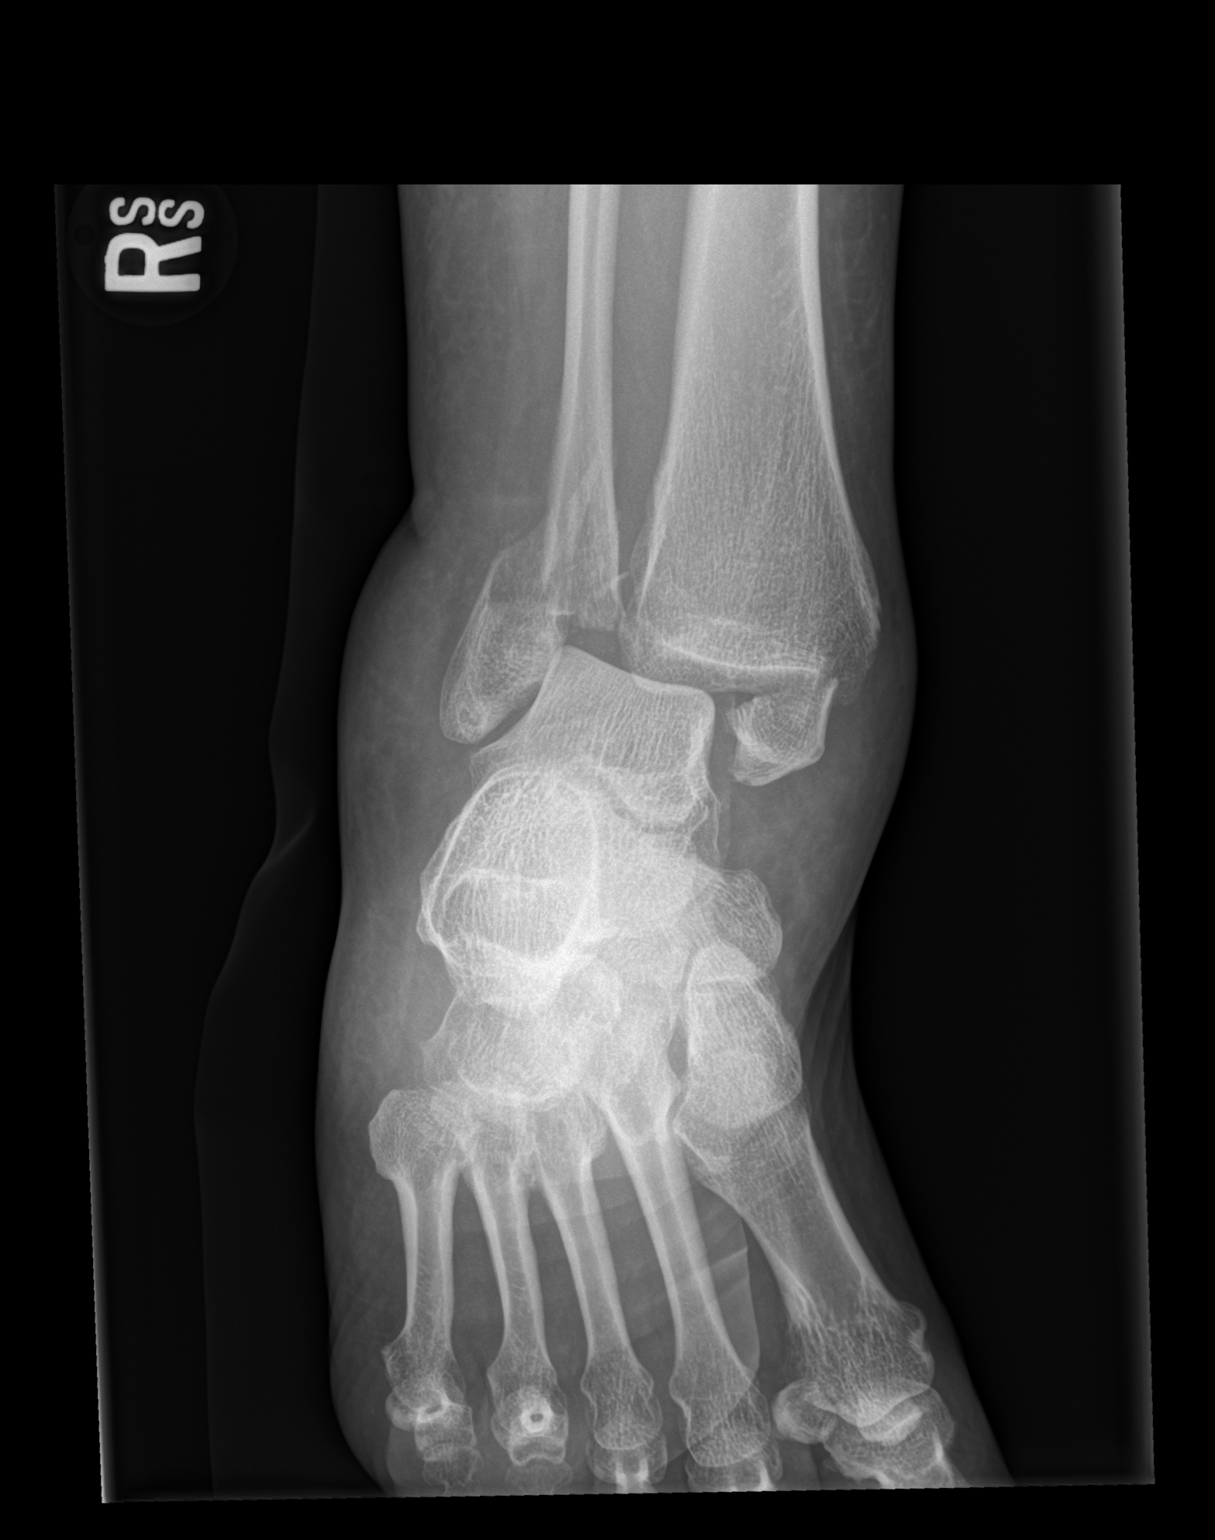

[3 of 3 positions shown; findings below may reference images not displayed]

FINDINGS: Bimalleolar ankle fracture is seen with lateral displacement of the
distal fracture fragments. There is also widening of the ankle
mortise with lateral subluxation of the talus.
IMPRESSION: Bimalleolar ankle fracture, with widening of ankle mortise and
lateral subluxation of the talus.

## 2017-04-24 IMAGING — RF DG ANKLE COMPLETE 3+V*R*
1 series · 3 of 3 positions shown · non-contrast
Comparison: 08/03/2016 .

CLINICAL DATA: ORIF.

EXAM:
RIGHT ANKLE - COMPLETE 3+ VIEW

[Series 1: run · 3 of 3 slices shown]
[im 1/3]
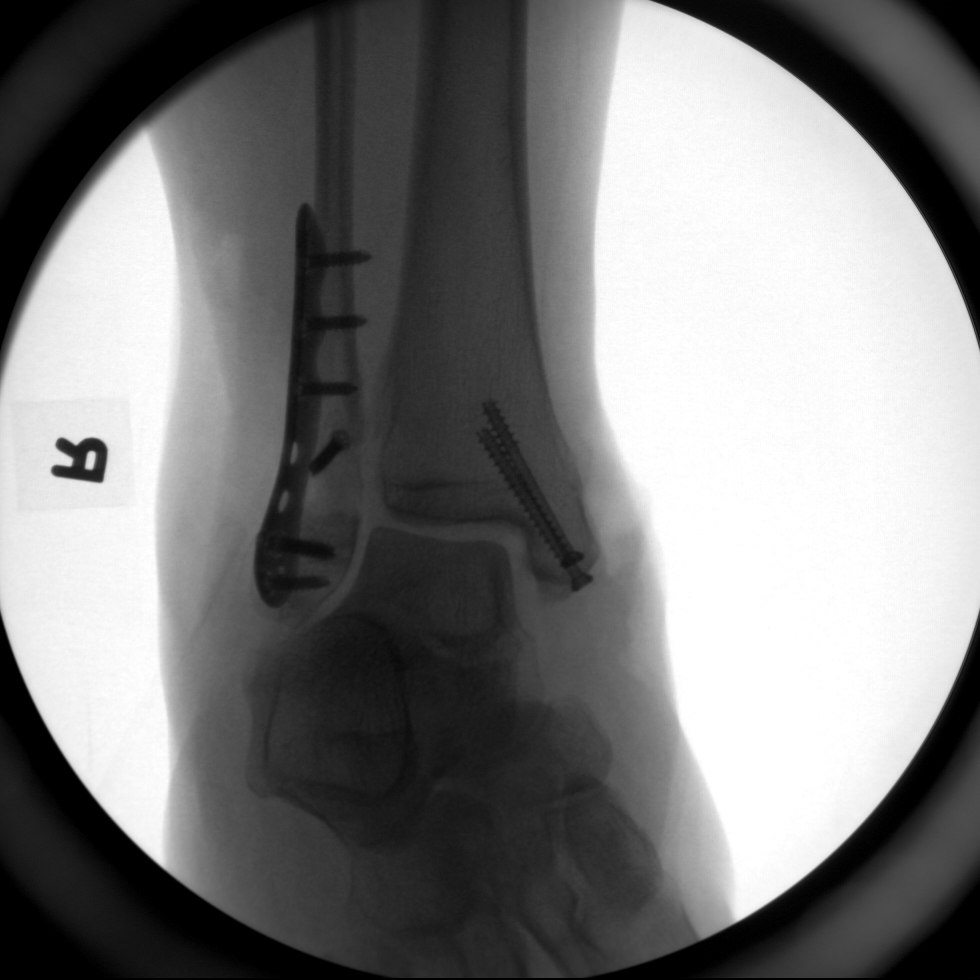
[im 2/3]
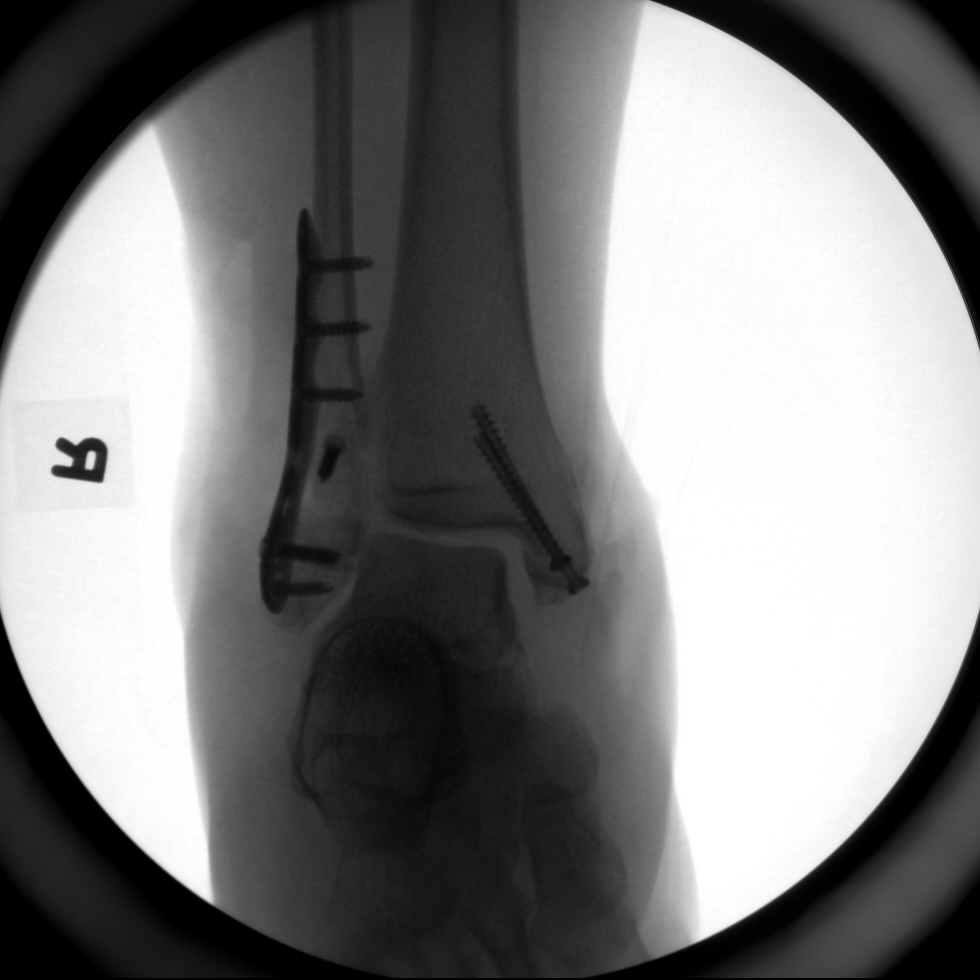
[im 3/3]
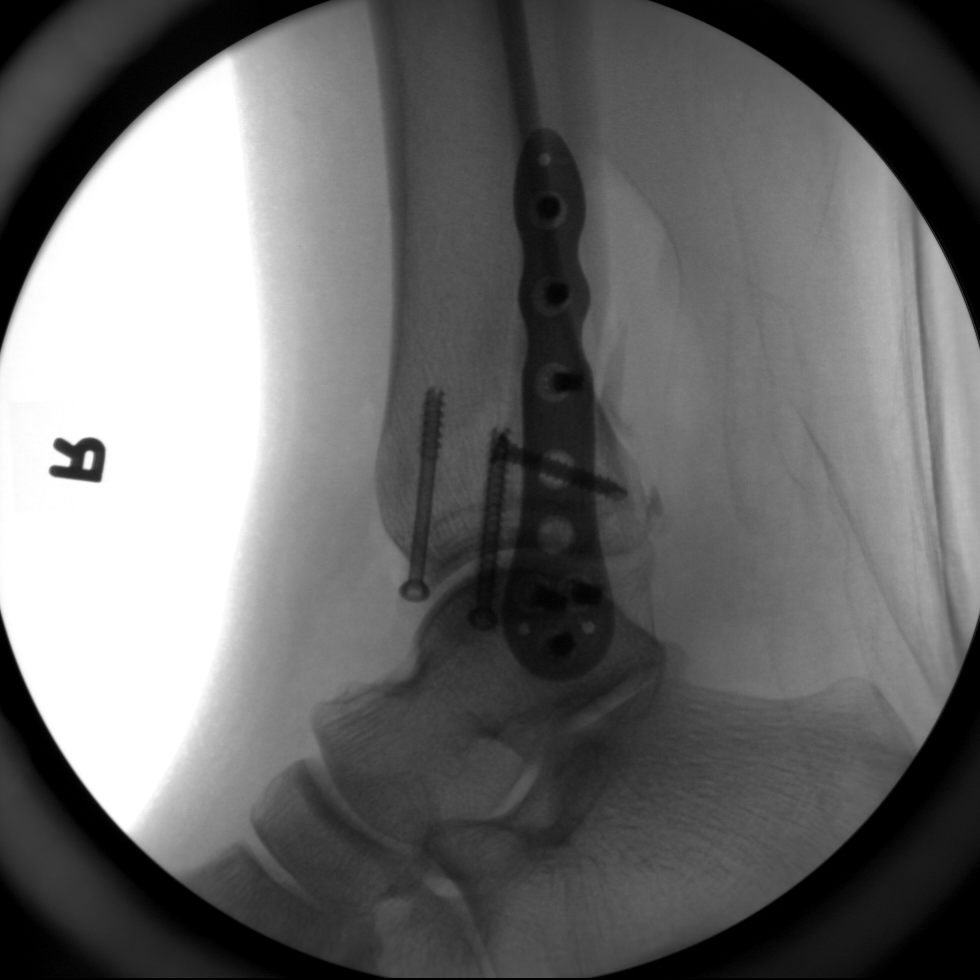

[3 of 3 positions shown; findings below may reference images not displayed]

FINDINGS: Of reduction internal fixation the medio and lateral malleoli noted.
Anatomic alignment. No evidence of fracture or dislocation.
IMPRESSION: ORIF medial and lateral malleoli. Hardware intact. Anatomic
alignment.

## 2017-07-16 MED FILL — NIFEDIPINE ER 90 MG TABLET: 90 | 90 days supply | Qty: 90 | Fill #1

## 2017-08-26 ENCOUNTER — Telehealth (INDEPENDENT_AMBULATORY_CARE_PROVIDER_SITE_OTHER): Payer: Self-pay | Admitting: Orthopaedic Surgery

## 2017-08-26 NOTE — Telephone Encounter (Signed)
See message below °

## 2017-08-26 NOTE — Telephone Encounter (Signed)
Patient called needing an extension on her handicap placard. The number to contact patient is [phone]

## 2017-08-26 NOTE — Telephone Encounter (Signed)
Yes 1 year

## 2017-08-29 NOTE — Telephone Encounter (Signed)
Ready for pick up patient aware. ° °

## 2017-09-26 MED FILL — NIFEDIPINE ER 90 MG TABLET: 90 | 90 days supply | Qty: 90 | Fill #2

## 2018-01-16 MED FILL — NIFEDIPINE ER 90 MG TABLET: 90 | 90 days supply | Qty: 90 | Fill #3

## 2018-03-10 ENCOUNTER — Telehealth (INDEPENDENT_AMBULATORY_CARE_PROVIDER_SITE_OTHER): Payer: Self-pay | Admitting: Orthopaedic Surgery

## 2018-03-10 NOTE — Telephone Encounter (Signed)
Please advise. If so for how long?

## 2018-03-10 NOTE — Telephone Encounter (Signed)
Patient requesting handicap placard. Patients # [phone]

## 2018-03-10 NOTE — Telephone Encounter (Signed)
1year

## 2018-03-11 NOTE — Telephone Encounter (Signed)
At front for pick up. Patient advised.

## 2018-03-11 NOTE — Telephone Encounter (Signed)
Has to be 6 months or 5 years. Filled for 6 months. At desk for pick up.

## 2018-03-31 ENCOUNTER — Other Ambulatory Visit (INDEPENDENT_AMBULATORY_CARE_PROVIDER_SITE_OTHER): Payer: 59

## 2018-03-31 ENCOUNTER — Ambulatory Visit: Payer: 59 | Admitting: Family

## 2018-03-31 ENCOUNTER — Encounter: Payer: Self-pay | Admitting: Family

## 2018-03-31 ENCOUNTER — Other Ambulatory Visit (HOSPITAL_COMMUNITY)
Admission: RE | Admit: 2018-03-31 | Discharge: 2018-03-31 | Disposition: A | Discharge status: Home / Self Care | Payer: 59 | Source: Ambulatory Visit | Attending: Family | Admitting: Family

## 2018-03-31 ENCOUNTER — Other Ambulatory Visit: Payer: Self-pay

## 2018-03-31 VITALS — BP 140/82 | HR 92 | Temp 98.5°F | Ht 67.0 in | Wt 203.0 lb

## 2018-03-31 DIAGNOSIS — Z1211 Encounter for screening for malignant neoplasm of colon: Secondary | ICD-10-CM

## 2018-03-31 DIAGNOSIS — Z13220 Encounter for screening for lipoid disorders: Secondary | ICD-10-CM

## 2018-03-31 DIAGNOSIS — E559 Vitamin D deficiency, unspecified: Secondary | ICD-10-CM

## 2018-03-31 DIAGNOSIS — Z0000 Encounter for general adult medical examination without abnormal findings: Secondary | ICD-10-CM | POA: Diagnosis not present

## 2018-03-31 DIAGNOSIS — B9689 Other specified bacterial agents as the cause of diseases classified elsewhere: Secondary | ICD-10-CM | POA: Diagnosis not present

## 2018-03-31 DIAGNOSIS — Z124 Encounter for screening for malignant neoplasm of cervix: Secondary | ICD-10-CM

## 2018-03-31 DIAGNOSIS — Z1231 Encounter for screening mammogram for malignant neoplasm of breast: Secondary | ICD-10-CM

## 2018-03-31 DIAGNOSIS — I10 Essential (primary) hypertension: Secondary | ICD-10-CM | POA: Diagnosis not present

## 2018-03-31 DIAGNOSIS — N760 Acute vaginitis: Secondary | ICD-10-CM | POA: Diagnosis not present

## 2018-03-31 DIAGNOSIS — L918 Other hypertrophic disorders of the skin: Secondary | ICD-10-CM

## 2018-03-31 LAB — TSH: TSH: 0.57 u[IU]/mL (ref 0.35–4.50)

## 2018-03-31 LAB — LIPID PANEL
CHOL/HDL RATIO: 4
CHOLESTEROL: 171 mg/dL (ref 0–200)
HDL: 41.2 mg/dL (ref >39.00)
LDL CALC: 110 mg/dL — AB (ref 0–99)
NonHDL: 129.57
TRIGLYCERIDES: 98 mg/dL (ref 0.0–149.0)
VLDL: 19.6 mg/dL (ref 0.0–40.0)

## 2018-03-31 LAB — CBC WITH DIFFERENTIAL/PLATELET
BASOS ABS: 0 10*3/uL (ref 0.0–0.1)
Basophils Relative: 1 % (ref 0.0–3.0)
Eosinophils Absolute: 0.1 10*3/uL (ref 0.0–0.7)
Eosinophils Relative: 2.4 % (ref 0.0–5.0)
HCT: 38.1 % (ref 36.0–46.0)
Hemoglobin: 13 g/dL (ref 12.0–15.0)
LYMPHS ABS: 2 10*3/uL (ref 0.7–4.0)
Lymphocytes Relative: 40.3 % (ref 12.0–46.0)
MCHC: 34.3 g/dL (ref 30.0–36.0)
MCV: 88 fl (ref 78.0–100.0)
MONO ABS: 0.3 10*3/uL (ref 0.1–1.0)
Monocytes Relative: 5.7 % (ref 3.0–12.0)
NEUTROS ABS: 2.5 10*3/uL (ref 1.4–7.7)
NEUTROS PCT: 50.6 % (ref 43.0–77.0)
PLATELETS: 341 10*3/uL (ref 150.0–400.0)
RBC: 4.33 Mil/uL (ref 3.87–5.11)
RDW: 13 % (ref 11.5–15.5)
WBC: 4.9 10*3/uL (ref 4.0–10.5)

## 2018-03-31 LAB — COMPREHENSIVE METABOLIC PANEL
ALBUMIN: 4.3 g/dL (ref 3.5–5.2)
ALK PHOS: 55 U/L (ref 39–117)
ALT: 9 U/L (ref 0–35)
AST: 10 U/L (ref 0–37)
BILIRUBIN TOTAL: 0.4 mg/dL (ref 0.2–1.2)
BUN: 15 mg/dL (ref 6–23)
CALCIUM: 9.4 mg/dL (ref 8.4–10.5)
CO2: 24 meq/L (ref 19–32)
Chloride: 104 mEq/L (ref 96–112)
Creatinine, Ser: 0.78 mg/dL (ref 0.40–1.20)
GFR: 99.79 mL/min (ref >60.00)
GLUCOSE: 109 mg/dL — AB (ref 70–99)
Potassium: 3.6 mEq/L (ref 3.5–5.1)
Sodium: 140 mEq/L (ref 135–145)
Total Protein: 8.4 g/dL — ABNORMAL HIGH (ref 6.0–8.3)

## 2018-03-31 LAB — VITAMIN D 25 HYDROXY (VIT D DEFICIENCY, FRACTURES): VITD: 10.07 ng/mL — ABNORMAL LOW (ref 30.00–100.00)

## 2018-03-31 MED ORDER — NIFEDIPINE ER OSMOTIC RELEASE 90 MG PO TB24: 90.0000 mg | ORAL_TABLET | Freq: Every day | ORAL | 3 refills | Status: DC

## 2018-03-31 MED FILL — NIFEDIPINE ER 90 MG TABLET: 90 | 90 days supply | Qty: 90 | Fill #0

## 2018-03-31 NOTE — Patient Instructions (Signed)

## 2018-03-31 NOTE — Progress Notes (Signed)
Colleen Potter is a 52 y.o. female with the following history as recorded in Epic[employer]:  Patient Active Problem List   Diagnosis Date Noted  . Displaced trimalleolar fracture of right lower leg, initial encounter for closed fracture 08/06/2016  . Essential hypertension 10/16/2015    Current Outpatient Medications  Medication Sig Dispense Refill  . NIFEdipine (PROCARDIA XL/ADALAT-CC) 90 MG 24 hr tablet Take 1 tablet (90 mg total) by mouth daily. 90 tablet 3   No current facility-administered medications for this visit.     Allergies: Patient has no known allergies.  Past Medical History:  Diagnosis Date  . Hypertension    states under control with med., has been on med. x 2 yr.  . Trimalleolar fracture of ankle, closed 08/03/2016   right    Past Surgical History:  Procedure Laterality Date  . NO PAST SURGERIES    . ORIF ANKLE FRACTURE Right 08/07/2016   Procedure: OPEN REDUCTION INTERNAL FIXATION (ORIF) RIGHT ANKLE FRACTURE;  Surgeon: Leandrew Koyanagi, MD;  Location: [facility];  Service: Orthopedics;  Laterality: Right;    History reviewed. No pertinent family history.  Social History   Tobacco Use  . Smoking status: Never Smoker  . Smokeless tobacco: Never Used  Substance Use Topics  . Alcohol use: No    Alcohol/week: 0.0 standard drinks    Subjective:  Patient presents for transfer of care; needs to get yearly CPE updated today; needs refill on her blood pressure medications; notes that her blood pressure is always elevated at office- does check occasionally at home/ will plan to purchase new home blood pressure cuff; Denies any chest pain, shortness of breath, blurred vision or headache.  Overdue for pap smear- agrees to update today; Prefers cologuard as opposed to colonoscopy; Will get flu shot from her employer;  Review of Systems  Constitutional: Negative.   HENT: Negative.   Eyes: Negative for blurred vision and pain.  Respiratory: Negative for cough and  shortness of breath.   Cardiovascular: Negative for chest pain.  Gastrointestinal: Negative for abdominal pain, constipation and diarrhea.  Genitourinary: Negative.   Musculoskeletal: Negative for neck pain.  Skin: Negative.   Neurological: Negative for dizziness and headaches.  Psychiatric/Behavioral: Negative for depression. The patient is not nervous/anxious and does not have insomnia.       Objective:  Vitals:   03/31/18 0951  BP: 140/82  Pulse: 92  Temp: 98.5 F (36.9 C)  TempSrc: Oral  SpO2: 96%  Weight: 203 lb (92.1 kg)  Height: 5' 7"  (1.702 m)    General: Well developed, well nourished, in no acute distress  Skin : Warm and dry. Large growth ( ? Skin tag) noted on left buttock Head: Normocephalic and atraumatic  Eyes: Sclera and conjunctiva clear; pupils round and reactive to light; extraocular movements intact  Ears: External normal; canals clear; tympanic membranes normal  Oropharynx: Pink, supple. No suspicious lesions  Neck: Supple without thyromegaly, adenopathy  Lungs: Respirations unlabored; clear to auscultation bilaterally without wheeze, rales, rhonchi  CVS exam: normal rate and regular rhythm.  Abdomen: Soft; nontender; nondistended; normoactive bowel sounds; no masses or hepatosplenomegaly  Musculoskeletal: No deformities; no active joint inflammation  Extremities: No edema, cyanosis, clubbing  Vessels: Symmetric bilaterally  Neurologic: Alert and oriented; speech intact; face symmetrical; moves all extremities well; CNII-XII intact without focal deficit   Assessment:  1. PE (physical exam), annual   2. Screening mammogram, encounter for   3. Lipid screening   4. Vitamin D  deficiency   5. Pap smear for cervical cancer screening   6. Essential hypertension   7. Skin tag     Plan:  Age appropriate preventive healthcare needs addressed; encouraged regular eye doctor and dental exams; encouraged regular exercise; will update labs and refills as needed  today; follow-up to be determined; Thin Prep pap collected; order updated for Cologuard; encouraged exercise/ weight loss; follow-up for blood pressure check in 6 months, sooner prn based on labs.    Return in about 6 months (around 09/29/2018).  Orders Placed This Encounter  Procedures  . MM Digital Screening    Standing Status:   Future    Standing Expiration Date:   06/01/2019    Order Specific Question:   Reason for Exam (SYMPTOM  OR DIAGNOSIS REQUIRED)    Answer:   screening mammogram    Order Specific Question:   Is the patient pregnant?    Answer:   No    Order Specific Question:   Preferred imaging location?    Answer:   [facility]  . CBC w/Diff    Standing Status:   Future    Number of Occurrences:   1    Standing Expiration Date:   03/31/2019  . Comp Met (CMET)    Standing Status:   Future    Number of Occurrences:   1    Standing Expiration Date:   03/31/2019  . Lipid panel    Standing Status:   Future    Number of Occurrences:   1    Standing Expiration Date:   04/01/2019  . Vitamin D (25 hydroxy)    Standing Status:   Future    Number of Occurrences:   1    Standing Expiration Date:   03/31/2019  . TSH    Standing Status:   Future    Number of Occurrences:   1    Standing Expiration Date:   03/31/2019  . Ambulatory referral to Dermatology    Referral Priority:   Routine    Referral Type:   Consultation    Referral Reason:   Specialty Services Required    Requested Specialty:   Dermatology    Number of Visits Requested:   1    Requested Prescriptions   Signed Prescriptions Disp Refills  . NIFEdipine (PROCARDIA XL/ADALAT-CC) 90 MG 24 hr tablet 90 tablet 3    Sig: Take 1 tablet (90 mg total) by mouth daily.

## 2018-03-31 NOTE — Addendum Note (Signed)
Addended by: Marcina Millard on: 03/31/2018 04:21 PM   Modules accepted: Orders

## 2018-04-01 ENCOUNTER — Other Ambulatory Visit: Payer: Self-pay | Admitting: Family

## 2018-04-01 LAB — CYTOLOGY - PAP
BACTERIAL VAGINITIS: POSITIVE — AB
Candida vaginitis: NEGATIVE
DIAGNOSIS: NEGATIVE
HPV (WINDOPATH): NOT DETECTED
Trichomonas: NEGATIVE

## 2018-04-01 MED ORDER — VITAMIN D (ERGOCALCIFEROL) 1.25 MG (50000 UNIT) PO CAPS: 50000.0000 [IU] | ORAL_CAPSULE | ORAL | 0 refills | Status: AC

## 2018-04-01 MED ORDER — METRONIDAZOLE 500 MG PO TABS: 500.0000 mg | ORAL_TABLET | Freq: Two times a day (BID) | ORAL | 0 refills | Status: DC

## 2018-04-01 MED FILL — VIT D2 1.25 MG (50,000 UNIT: 1.25 MG | 84 days supply | Qty: 12 | Fill #0

## 2018-04-01 MED FILL — metroNIDAZOLE 500 MG TABS: 500 | 7 days supply | Qty: 14 | Fill #0

## 2018-04-15 ENCOUNTER — Other Ambulatory Visit: Payer: Self-pay

## 2018-04-15 DIAGNOSIS — I83893 Varicose veins of bilateral lower extremities with other complications: Secondary | ICD-10-CM

## 2018-04-23 DIAGNOSIS — D179 Benign lipomatous neoplasm, unspecified: Secondary | ICD-10-CM | POA: Diagnosis not present

## 2018-04-23 DIAGNOSIS — Z23 Encounter for immunization: Secondary | ICD-10-CM | POA: Diagnosis not present

## 2018-04-27 ENCOUNTER — Ambulatory Visit
Admission: RE | Admit: 2018-04-27 | Discharge: 2018-04-27 | Disposition: A | Discharge status: Home / Self Care | Payer: 59 | Source: Ambulatory Visit | Attending: Family | Admitting: Family

## 2018-04-27 DIAGNOSIS — Z1231 Encounter for screening mammogram for malignant neoplasm of breast: Secondary | ICD-10-CM | POA: Diagnosis not present

## 2018-05-12 ENCOUNTER — Encounter: Payer: Self-pay | Admitting: Vascular Surgery

## 2018-05-12 ENCOUNTER — Ambulatory Visit (HOSPITAL_COMMUNITY)
Admission: RE | Admit: 2018-05-12 | Discharge: 2018-05-12 | Disposition: A | Discharge status: Home / Self Care | Payer: 59 | Source: Ambulatory Visit | Attending: Vascular Surgery | Admitting: Vascular Surgery

## 2018-05-12 ENCOUNTER — Ambulatory Visit: Payer: 59 | Admitting: Vascular Surgery

## 2018-05-12 DIAGNOSIS — I872 Venous insufficiency (chronic) (peripheral): Secondary | ICD-10-CM | POA: Diagnosis not present

## 2018-05-12 DIAGNOSIS — I83893 Varicose veins of bilateral lower extremities with other complications: Secondary | ICD-10-CM | POA: Insufficient documentation

## 2018-05-12 NOTE — Progress Notes (Signed)
Patient name: Colleen Potter MRN: [id number] DOB: 1966-04-03 Sex: female  REASON FOR CONSULT: Varicose veins  HPI: Colleen Potter is a 52 y.o. female, with history of hypertension that presents for evaluation of new varicose veins.  Patient's predominant complaint is leg swelling worse in the right leg than the left leg.  She states her swelling has been worse since she had an ankle fracture years ago.  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  She denies any other trauma besides an ankle fracture.  She has never had any surgery or vascular procedure to her lower extremities.  No previous venous interventions.  States she is recently started wearing compression but has had a hard time finding ones that fit appropriately.  She really denies any significant heaviness in the legs or pain.  She says at times she does have some itching behind the right knee.  No hx DVT.   Past Medical History:  Diagnosis Date  . Hypertension    states under control with med., has been on med. x 2 yr.  . Trimalleolar fracture of ankle, closed 08/03/2016   right    Past Surgical History:  Procedure Laterality Date  . NO PAST SURGERIES    . ORIF ANKLE FRACTURE Right 08/07/2016   Procedure: OPEN REDUCTION INTERNAL FIXATION (ORIF) RIGHT ANKLE FRACTURE;  Surgeon: Leandrew Koyanagi, MD;  Location: [facility];  Service: Orthopedics;  Laterality: Right;    History reviewed. No pertinent family history.  SOCIAL HISTORY: Social History   Socioeconomic History  . Marital status: Single    Spouse name: Not on file  . Number of children: 2  . Years of education: 72  . Highest education level: Not on file  Occupational History  . Occupation: EDS  Social Needs  . Financial resource strain: Not on file  . Food insecurity:    Worry: Not on file    Inability: Not on file  . Transportation needs:    Medical: Not on file    Non-medical: Not on file  Tobacco Use  . Smoking  status: Never Smoker  . Smokeless tobacco: Never Used  Substance and Sexual Activity  . Alcohol use: No    Alcohol/week: 0.0 standard drinks  . Drug use: No  . Sexual activity: Not on file  Lifestyle  . Physical activity:    Days per week: Not on file    Minutes per session: Not on file  . Stress: Not on file  Relationships  . Social connections:    Talks on phone: Not on file    Gets together: Not on file    Attends religious service: Not on file    Active member of club or organization: Not on file    Attends meetings of clubs or organizations: Not on file    Relationship status: Not on file  . Intimate partner violence:    Fear of current or ex partner: Not on file    Emotionally abused: Not on file    Physically abused: Not on file    Forced sexual activity: Not on file  Other Topics Concern  . Not on file  Social History Narrative   [employer]: [employer].   Denies abuse and feels safe at home.     No Known Allergies  Current Outpatient Medications  Medication Sig Dispense Refill  . NIFEdipine (PROCARDIA XL/ADALAT-CC) 90 MG 24 hr tablet Take 1 tablet (90 mg total) by  mouth daily. 90 tablet 3  . Vitamin D, Ergocalciferol, (DRISDOL) 50000 units CAPS capsule Take 1 capsule (50,000 Units total) by mouth every 7 (seven) days for 12 doses. 12 capsule 0  . metroNIDAZOLE (FLAGYL) 500 MG tablet Take 1 tablet (500 mg total) by mouth 2 (two) times daily. (Patient not taking: Reported on 05/12/2018) 14 tablet 0   No current facility-administered medications for this visit.     REVIEW OF SYSTEMS:  [X]  denotes positive finding, [ ]  denotes negative finding Cardiac  Comments:  Chest pain or chest pressure:    Shortness of breath upon exertion:    Short of breath when lying flat:    Irregular heart rhythm:        Vascular    Pain in calf, thigh, or hip brought on by ambulation:    Pain in feet at night that wakes you up from your sleep:     Blood clot in your veins:    Leg swelling:  x  Right > left      Pulmonary    Oxygen at home:    Productive cough:     Wheezing:         Neurologic    Sudden weakness in arms or legs:     Sudden numbness in arms or legs:     Sudden onset of difficulty speaking or slurred speech:    Temporary loss of vision in one eye:     Problems with dizziness:         Gastrointestinal    Blood in stool:     Vomited blood:         Genitourinary    Burning when urinating:     Blood in urine:        Psychiatric    Major depression:         Hematologic    Bleeding problems:    Problems with blood clotting too easily:        Skin    Rashes or ulcers:        Constitutional    Fever or chills:      PHYSICAL EXAM: Vitals:   05/12/18 1021  BP: (!) 150/86  Pulse: 68  Resp: 14  Temp: 97.7 F (36.5 C)  TempSrc: Oral  Weight: 90.3 kg  Height: 5\' 4"  (1.626 m)    GENERAL: The patient is a well-nourished female, in no acute distress. The vital signs are documented above. CARDIAC: There is a regular rate and rhythm.  VASCULAR:  2+ radial pulse palpable bilateral upper extremities 2+ femoral pulse palpable bilateral groins 2+ DP palpable bilateral lower extremities Right leg swelling > left Spider veins behind left knee, posterior right thigh, bilateral anterior thighs PULMONARY: There is good air exchange bilaterally without wheezing or rales. ABDOMEN: Soft and non-tender with normal pitched bowel sounds.  MUSCULOSKELETAL: There are no major deformities or cyanosis. NEUROLOGIC: No focal weakness or paresthesias are detected. SKIN: There are no ulcers or rashes noted. PSYCHIATRIC: The patient has a normal affect.  DATA:   I independently reviewed her lower extremity venous reflux study and her significant reflux times are only in the bilateral common femoral veins.  No elevated reflux times in the superficial venous system.  Assessment/Plan:  52 year old female who presents for evaluation of varicose veins in her bilateral  lower extremities.  Turns out her venous insufficiency is predominantly in the common femoral veins bilaterally and discussed we do not intervene on the deep venous system.  I have offered her thigh-high compression therapy with appropriate fitting here in our office.  Discussed other conservative measures like leg elevation and etc.  She does have a fair number of spider veins on exam including behind her left knee as well as her right thigh and other sporadic locations.  Discussed that we could offer her sclerotherapy for these if she wanted to call and schedule this with her sclerotherapy nurse in the future.  Otherwise offered her as needed follow-up.   Marty Heck, MD [facility] Office: [phone] Pager: [place]

## 2018-07-27 MED FILL — NIFEDIPINE ER 90 MG TABLET: 90 | 90 days supply | Qty: 90 | Fill #1

## 2018-09-08 ENCOUNTER — Telehealth (INDEPENDENT_AMBULATORY_CARE_PROVIDER_SITE_OTHER): Payer: Self-pay | Admitting: Orthopaedic Surgery

## 2018-09-08 NOTE — Telephone Encounter (Signed)
permanent

## 2018-09-08 NOTE — Telephone Encounter (Signed)
Patient called and stated she needs a handicap sticker.  Please call her when it's ready.  [phone]

## 2018-09-08 NOTE — Telephone Encounter (Signed)
Please advise. If so, for how long?  

## 2018-09-11 NOTE — Telephone Encounter (Signed)
Ready for pick up. Patient aware.  

## 2018-10-27 MED FILL — NIFEdipine ER OSMOTIC RELEA: 90 | 90 days supply | Qty: 90 | Fill #0

## 2018-11-05 ENCOUNTER — Ambulatory Visit (INDEPENDENT_AMBULATORY_CARE_PROVIDER_SITE_OTHER): Payer: 59

## 2018-11-05 ENCOUNTER — Ambulatory Visit (INDEPENDENT_AMBULATORY_CARE_PROVIDER_SITE_OTHER): Payer: 59 | Admitting: Orthopaedic Surgery

## 2018-11-05 ENCOUNTER — Other Ambulatory Visit: Payer: Self-pay

## 2018-11-05 ENCOUNTER — Encounter (INDEPENDENT_AMBULATORY_CARE_PROVIDER_SITE_OTHER): Payer: Self-pay | Admitting: Orthopaedic Surgery

## 2018-11-05 DIAGNOSIS — M6788 Other specified disorders of synovium and tendon, other site: Secondary | ICD-10-CM

## 2018-11-05 MED ORDER — NITROGLYCERIN 0.2 MG/HR TD PT24: MEDICATED_PATCH | TRANSDERMAL | 12 refills | Status: DC

## 2018-11-05 MED ORDER — DICLOFENAC SODIUM 2 % TD SOLN: 2.0000 g | Freq: Two times a day (BID) | TRANSDERMAL | 3 refills | Status: DC | PRN

## 2018-11-05 MED ORDER — MELOXICAM 7.5 MG PO TABS: 7.5000 mg | ORAL_TABLET | Freq: Two times a day (BID) | ORAL | 2 refills | Status: DC | PRN

## 2018-11-05 MED FILL — NITROGLYCERIN 0.2 MG/HR PTC: 0.2 | 30 days supply | Qty: 8 | Fill #0

## 2018-11-05 MED FILL — MELOXICAM 7.5 MG TABLET: 7.5 | 30 days supply | Qty: 60 | Fill #0

## 2018-11-05 NOTE — Progress Notes (Signed)
Office Visit Note   Patient: Colleen Potter           Date of Birth: 02-05-1966           MRN: [id number] Visit Date: 11/05/2018              Requested by: Marrian Salvage, [place], [place] [id number] PCP: Marrian Salvage, FNP   Assessment & Plan: Visit Diagnoses:  1. Achilles tendinosis of right lower extremity     Plan: Impression is right insertional Achilles tendinosis.  X-rays were reviewed with the patient today.  Overall the condition was reviewed with her.  She may use the walking boot that she had from a previous ankle surgery.  I have given her a prescription for pennsaid, nitroglycerin patch, meloxicam to use as needed.  She politely declined outpatient physical therapy.  Questions encouraged and answered.  Follow-up as needed. Total face to face encounter time was greater than 25 minutes and over half of this time was spent in counseling and/or coordination of care.  Follow-Up Instructions: Return if symptoms worsen or fail to improve.   Orders:  Orders Placed This Encounter  Procedures  . XR Ankle Complete Right   Meds ordered this encounter  Medications  . Diclofenac Sodium (PENNSAID) 2 % SOLN    Sig: Apply 2 g topically 2 (two) times daily as needed (to affected area).    Dispense:  112 g    Refill:  3  . nitroGLYCERIN (NITRODUR - DOSED IN MG/24 HR) 0.2 mg/hr patch    Sig: Apply 1/4 patch to area daily prn    Dispense:  30 patch    Refill:  12  . meloxicam (MOBIC) 7.5 MG tablet    Sig: Take 1 tablet (7.5 mg total) by mouth 2 (two) times daily as needed for pain.    Dispense:  60 tablet    Refill:  2      Procedures: No procedures performed   Clinical Data: No additional findings.   Subjective: Chief Complaint  Patient presents with  . Right Foot - Follow-up    Patient is a 53 year old female that I took care of 2 years ago for ankle fracture who comes in for a separate issue regarding her right foot and heel.  She  states that this issue is being going on for several months.  Sometimes the pain can be intense.  The pain is localized to the insertion of the Achilles.  She denies any numbness and tingling or radicular symptoms.  Denies any injuries or trauma.  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   HENT: Negative.   Eyes: Negative.   Respiratory: Negative.   Cardiovascular: Negative.   Endocrine: Negative.   Musculoskeletal: Negative.   Neurological: Negative.   Hematological: Negative.   Psychiatric/Behavioral: Negative.   All other systems reviewed and are negative.    Objective: Vital Signs: There were no vitals taken for this visit.  Physical Exam Vitals signs and nursing note reviewed.  Constitutional:      Appearance: She is well-developed.  Pulmonary:     Effort: Pulmonary effort is normal.  Skin:    General: Skin is warm.     Capillary Refill: Capillary refill takes less than 2 seconds.  Neurological:     Mental Status: She is alert and oriented  to person, place, and time.  Psychiatric:        Behavior: Behavior normal.        Thought Content: Thought content normal.        Judgment: Judgment normal.     Ortho Exam Right heel exam shows no tenderness at the bottom of the heel.  She has tenderness at the insertion of the Achilles.  She has good strength with ankle plantarflexion.  Posterior tibial tendon is nontender.  No Achilles contracture. Specialty Comments:  No specialty comments available.  Imaging: Xr Ankle Complete Right  Result Date: 11/05/2018 No hardware complications.  Small dorsal calcaneal spur consistent with insertional Achilles tendinopathy.    PMFS History: Patient Active Problem List   Diagnosis Date Noted  . Chronic venous insufficiency 05/12/2018  . Displaced trimalleolar fracture of right lower leg, initial encounter for closed  fracture 08/06/2016  . Essential hypertension 10/16/2015   Past Medical History:  Diagnosis Date  . Hypertension    states under control with med., has been on med. x 2 yr.  . Trimalleolar fracture of ankle, closed 08/03/2016   right    History reviewed. No pertinent family history.  Past Surgical History:  Procedure Laterality Date  . NO PAST SURGERIES    . ORIF ANKLE FRACTURE Right 08/07/2016   Procedure: OPEN REDUCTION INTERNAL FIXATION (ORIF) RIGHT ANKLE FRACTURE;  Surgeon: Leandrew Koyanagi, MD;  Location: [facility];  Service: Orthopedics;  Laterality: Right;   Social History   Occupational History  . Occupation: EDS  Tobacco Use  . Smoking status: Never Smoker  . Smokeless tobacco: Never Used  Substance and Sexual Activity  . Alcohol use: No    Alcohol/week: 0.0 standard drinks  . Drug use: No  . Sexual activity: Not on file

## 2019-01-12 IMAGING — MG DIGITAL SCREENING BILATERAL MAMMOGRAM WITH TOMO AND CAD
8 series · 9 of 24 positions shown · non-contrast
Comparison: None.

CLINICAL DATA: Screening.

EXAM:
DIGITAL SCREENING BILATERAL MAMMOGRAM WITH TOMO AND CAD

[L MLO synth-2D]
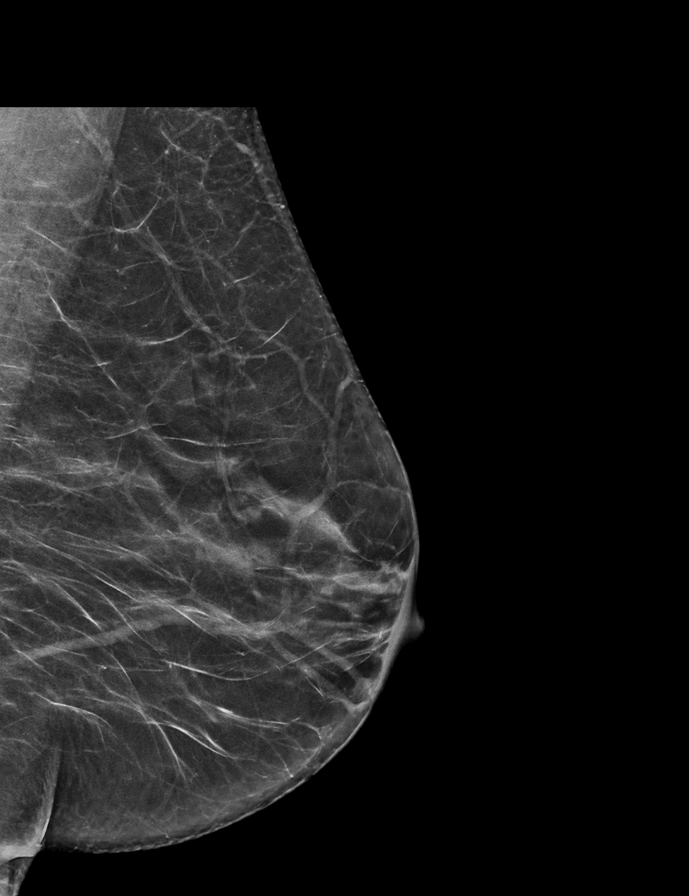

[L CC synth-2D]
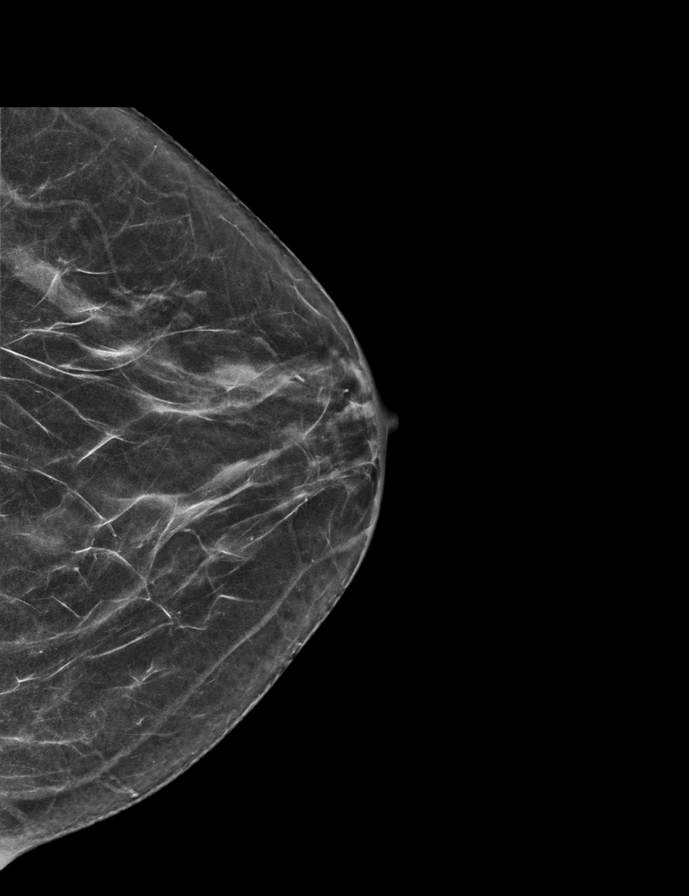

[R MLO synth-2D]
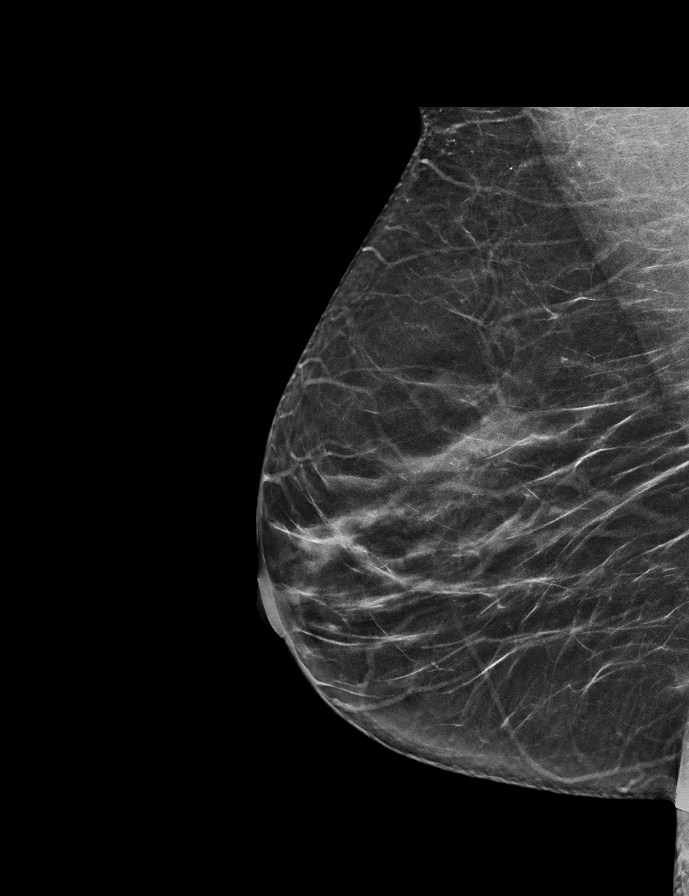

[R CC synth-2D]
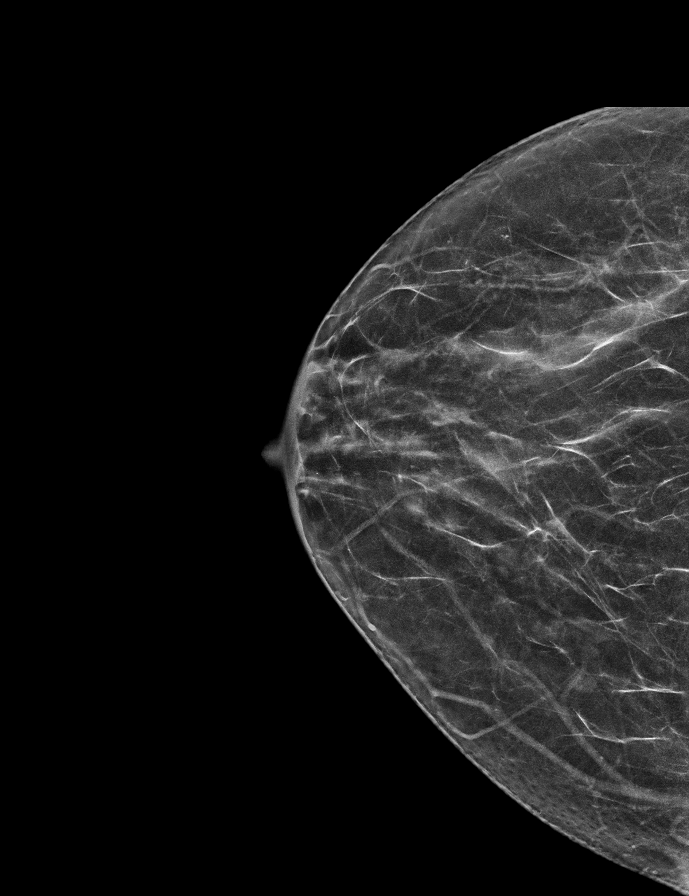

[R CC tomo · 2 of 57 frames shown]
[frame 19/57]
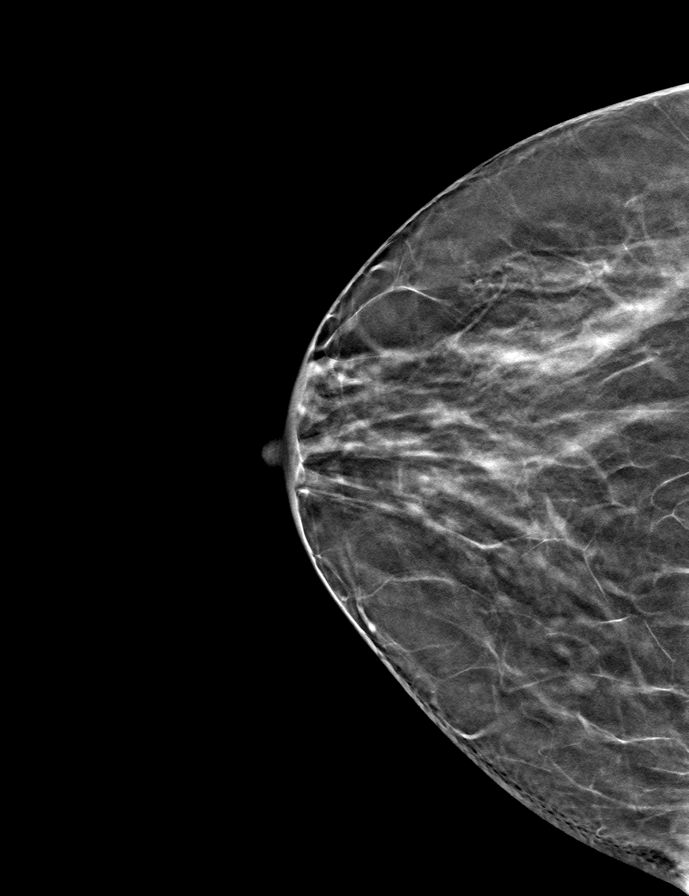
[frame 29/57]
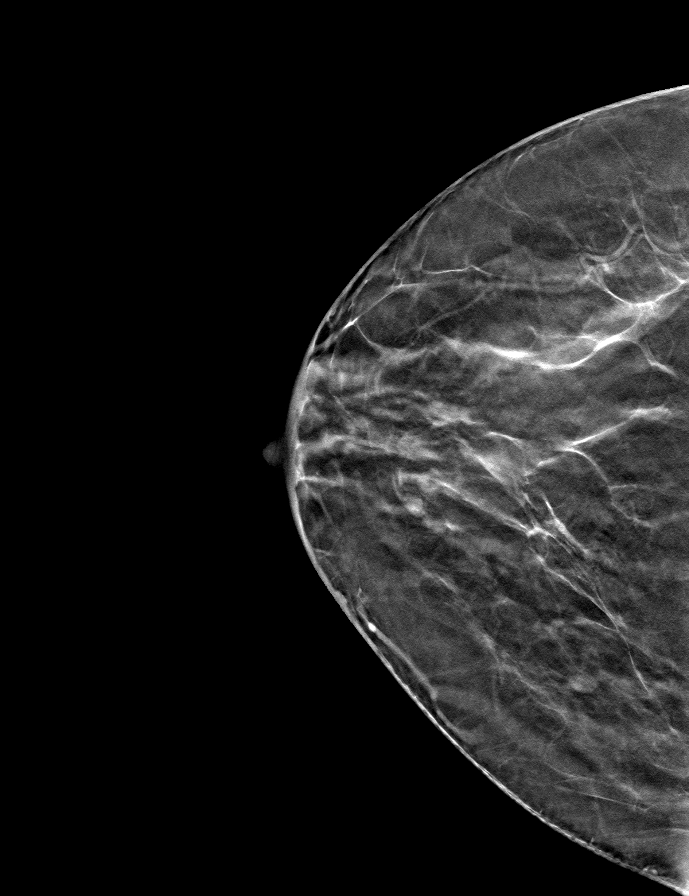

[R MLO tomo · tomo slice 32/63.0]
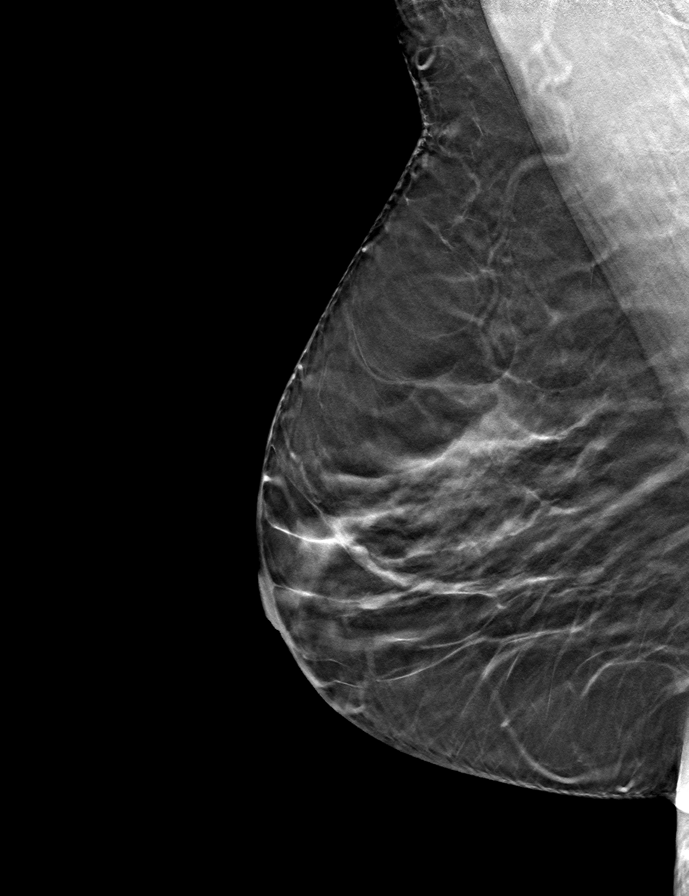

[L CC tomo · tomo slice 29/56.0]
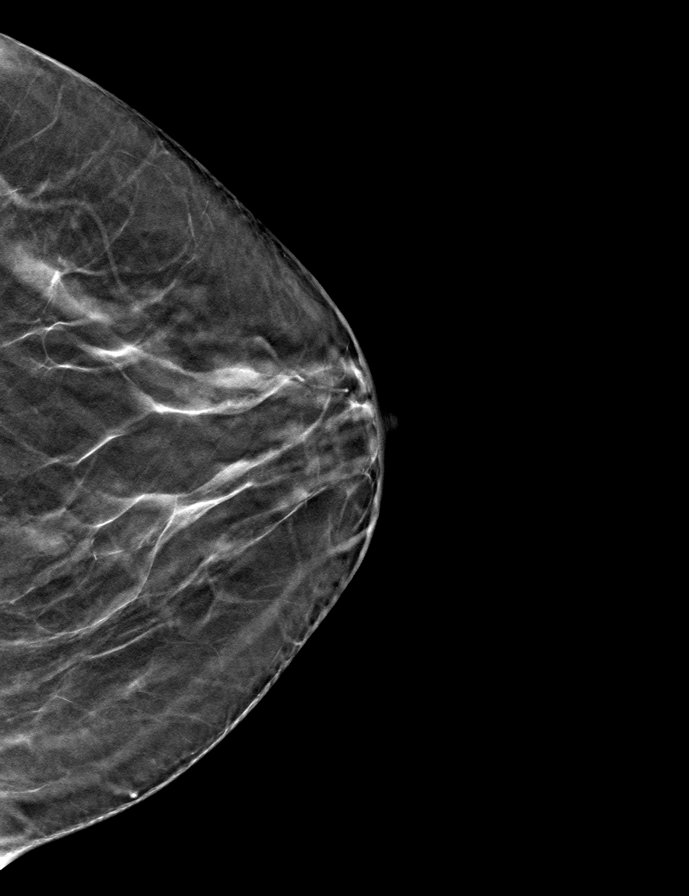

[L MLO tomo · tomo slice 31/61.0]
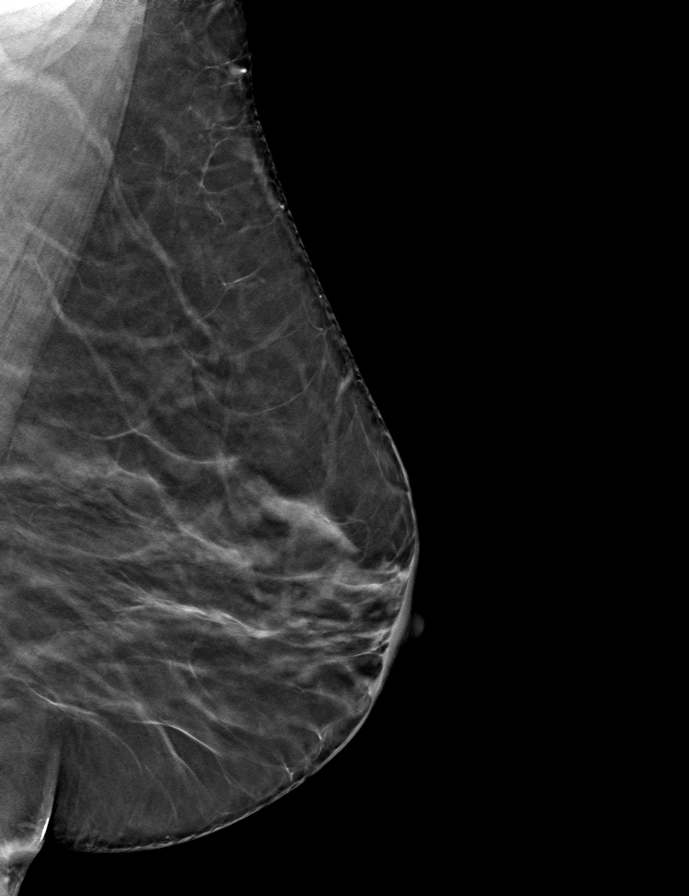

[9 of 24 positions shown; findings below may reference images not displayed]

ACR Breast Density Category b: There are scattered areas of
fibroglandular density.
FINDINGS: There are no findings suspicious for malignancy. Images were
processed with CAD.
IMPRESSION: No mammographic evidence of malignancy. A result letter of this
screening mammogram will be mailed directly to the patient.

RECOMMENDATION:
Screening mammogram in one year. (Code:Y5-G-EJ6)

BI-RADS CATEGORY  1: Negative.

## 2019-02-01 MED FILL — NIFEdipine ER OSMOTIC RELEA: 90 | 90 days supply | Qty: 90 | Fill #0

## 2019-04-27 ENCOUNTER — Other Ambulatory Visit: Payer: Self-pay | Admitting: Family

## 2019-04-27 DIAGNOSIS — I10 Essential (primary) hypertension: Secondary | ICD-10-CM

## 2019-04-27 MED FILL — NIFEDIPINE ER 90 MG TABLET: 90 | 90 days supply | Qty: 90 | Fill #0

## 2019-05-04 ENCOUNTER — Other Ambulatory Visit: Payer: Self-pay | Admitting: Family

## 2019-05-04 DIAGNOSIS — Z1231 Encounter for screening mammogram for malignant neoplasm of breast: Secondary | ICD-10-CM

## 2019-05-25 ENCOUNTER — Ambulatory Visit (INDEPENDENT_AMBULATORY_CARE_PROVIDER_SITE_OTHER): Payer: 59 | Admitting: Family

## 2019-05-25 ENCOUNTER — Other Ambulatory Visit (INDEPENDENT_AMBULATORY_CARE_PROVIDER_SITE_OTHER): Payer: 59

## 2019-05-25 ENCOUNTER — Encounter: Payer: Self-pay | Admitting: Family

## 2019-05-25 ENCOUNTER — Other Ambulatory Visit: Payer: Self-pay

## 2019-05-25 VITALS — BP 120/72 | HR 95 | Temp 98.1°F | Ht 64.0 in | Wt 211.0 lb

## 2019-05-25 DIAGNOSIS — E559 Vitamin D deficiency, unspecified: Secondary | ICD-10-CM

## 2019-05-25 DIAGNOSIS — Z0000 Encounter for general adult medical examination without abnormal findings: Secondary | ICD-10-CM | POA: Diagnosis not present

## 2019-05-25 DIAGNOSIS — R7309 Other abnormal glucose: Secondary | ICD-10-CM

## 2019-05-25 DIAGNOSIS — Z13220 Encounter for screening for lipoid disorders: Secondary | ICD-10-CM | POA: Diagnosis not present

## 2019-05-25 DIAGNOSIS — Z1211 Encounter for screening for malignant neoplasm of colon: Secondary | ICD-10-CM

## 2019-05-25 DIAGNOSIS — I10 Essential (primary) hypertension: Secondary | ICD-10-CM | POA: Diagnosis not present

## 2019-05-25 LAB — COMPREHENSIVE METABOLIC PANEL
ALT: 9 U/L (ref 0–35)
AST: 10 U/L (ref 0–37)
Albumin: 4.3 g/dL (ref 3.5–5.2)
Alkaline Phosphatase: 59 U/L (ref 39–117)
BUN: 11 mg/dL (ref 6–23)
CO2: 27 mEq/L (ref 19–32)
Calcium: 9 mg/dL (ref 8.4–10.5)
Chloride: 102 mEq/L (ref 96–112)
Creatinine, Ser: 0.74 mg/dL (ref 0.40–1.20)
GFR: 99.33 mL/min (ref >60.00)
Glucose, Bld: 114 mg/dL — ABNORMAL HIGH (ref 70–99)
Potassium: 3.4 mEq/L — ABNORMAL LOW (ref 3.5–5.1)
Sodium: 136 mEq/L (ref 135–145)
Total Bilirubin: 0.5 mg/dL (ref 0.2–1.2)
Total Protein: 7.8 g/dL (ref 6.0–8.3)

## 2019-05-25 LAB — CBC WITH DIFFERENTIAL/PLATELET
Basophils Absolute: 0.1 10*3/uL (ref 0.0–0.1)
Basophils Relative: 1.2 % (ref 0.0–3.0)
Eosinophils Absolute: 0.2 10*3/uL (ref 0.0–0.7)
Eosinophils Relative: 2.7 % (ref 0.0–5.0)
HCT: 36.6 % (ref 36.0–46.0)
Hemoglobin: 12.3 g/dL (ref 12.0–15.0)
Lymphocytes Relative: 34.3 % (ref 12.0–46.0)
Lymphs Abs: 2 10*3/uL (ref 0.7–4.0)
MCHC: 33.6 g/dL (ref 30.0–36.0)
MCV: 89.6 fl (ref 78.0–100.0)
Monocytes Absolute: 0.4 10*3/uL (ref 0.1–1.0)
Monocytes Relative: 6.4 % (ref 3.0–12.0)
Neutro Abs: 3.3 10*3/uL (ref 1.4–7.7)
Neutrophils Relative %: 55.4 % (ref 43.0–77.0)
Platelets: 384 10*3/uL (ref 150.0–400.0)
RBC: 4.08 Mil/uL (ref 3.87–5.11)
RDW: 13.3 % (ref 11.5–15.5)
WBC: 5.9 10*3/uL (ref 4.0–10.5)

## 2019-05-25 LAB — LIPID PANEL
Cholesterol: 167 mg/dL (ref 0–200)
HDL: 43.2 mg/dL (ref >39.00)
LDL Cholesterol: 102 mg/dL — ABNORMAL HIGH (ref 0–99)
NonHDL: 123.79
Total CHOL/HDL Ratio: 4
Triglycerides: 109 mg/dL (ref 0.0–149.0)
VLDL: 21.8 mg/dL (ref 0.0–40.0)

## 2019-05-25 LAB — TSH: TSH: 0.61 u[IU]/mL (ref 0.35–4.50)

## 2019-05-25 LAB — HEMOGLOBIN A1C: Hgb A1c MFr Bld: 5.9 % (ref 4.6–6.5)

## 2019-05-25 LAB — VITAMIN D 25 HYDROXY (VIT D DEFICIENCY, FRACTURES): VITD: 11.63 ng/mL — ABNORMAL LOW (ref 30.00–100.00)

## 2019-05-25 MED ORDER — NIFEDIPINE ER OSMOTIC RELEASE 90 MG PO TB24: 90.0000 mg | ORAL_TABLET | Freq: Every day | ORAL | 3 refills | Status: DC

## 2019-05-25 NOTE — Progress Notes (Signed)
Colleen Potter is a 53 y.o. female with the following history as recorded in EpicCare:  Patient Active Problem List   Diagnosis Date Noted  . Chronic venous insufficiency 05/12/2018  . Displaced trimalleolar fracture of right lower leg, initial encounter for closed fracture 08/06/2016  . Essential hypertension 10/16/2015    Current Outpatient Medications  Medication Sig Dispense Refill  . NIFEdipine (PROCARDIA XL/NIFEDICAL-XL) 90 MG 24 hr tablet Take 1 tablet (90 mg total) by mouth daily. 90 tablet 3  . nitroGLYCERIN (NITRODUR - DOSED IN MG/24 HR) 0.2 mg/hr patch Apply 1/4 patch to area daily prn (Patient not taking: Reported on 05/25/2019) 30 patch 12   No current facility-administered medications for this visit.     Allergies: Patient has no known allergies.  Past Medical History:  Diagnosis Date  . Hypertension    states under control with med., has been on med. x 2 yr.  . Trimalleolar fracture of ankle, closed 08/03/2016   right    Past Surgical History:  Procedure Laterality Date  . NO PAST SURGERIES    . ORIF ANKLE FRACTURE Right 08/07/2016   Procedure: OPEN REDUCTION INTERNAL FIXATION (ORIF) RIGHT ANKLE FRACTURE;  Surgeon: Leandrew Koyanagi, MD;  Location: [facility];  Service: Orthopedics;  Laterality: Right;    History reviewed. No pertinent family history.  Social History   Tobacco Use  . Smoking status: Never Smoker  . Smokeless tobacco: Never Used  Substance Use Topics  . Alcohol use: No    Alcohol/week: 0.0 standard drinks    Subjective:  Presents for yearly CPE; in baseline state of health today;  Did not get Cologuard as discussed last year- agreeable to schedule; Overdue to see eye doctor and dentist; in the process of finding new providers;  Review of Systems  Constitutional: Negative.   HENT: Negative.   Eyes: Negative.   Respiratory: Negative.   Cardiovascular: Negative.   Gastrointestinal: Negative.   Genitourinary: Negative.    Musculoskeletal: Negative.   Skin: Negative.   Neurological: Negative.   Endo/Heme/Allergies: Negative.   Psychiatric/Behavioral: Negative.      Health Maintenance  Topic Date Due  . HIV Screening  05/14/1981  . COLONOSCOPY  05/24/2020 (Originally 05/14/2016)  . MAMMOGRAM  04/27/2020  . PAP SMEAR-Modifier  03/31/2021  . TETANUS/TDAP  09/26/2025  . INFLUENZA VACCINE  Completed      Objective:  Vitals:   05/25/19 0925  BP: 120/72  Pulse: 95  Temp: 98.1 F (36.7 C)  TempSrc: Oral  SpO2: 98%  Weight: 211 lb (95.7 kg)  Height: 5' 4"  (1.626 m)    General: Well developed, well nourished, in no acute distress  Skin : Warm and dry.  Head: Normocephalic and atraumatic  Eyes: Sclera and conjunctiva clear; pupils round and reactive to light; extraocular movements intact  Ears: External normal; canals clear; tympanic membranes normal  Oropharynx: Pink, supple. No suspicious lesions  Neck: Supple without thyromegaly, adenopathy  Lungs: Respirations unlabored; clear to auscultation bilaterally without wheeze, rales, rhonchi  CVS exam: normal rate and regular rhythm.  Abdomen: Soft; nontender; nondistended; normoactive bowel sounds; no masses or hepatosplenomegaly  Musculoskeletal: No deformities; no active joint inflammation  Extremities: No edema, cyanosis, clubbing  Vessels: Symmetric bilaterally  Neurologic: Alert and oriented; speech intact; face symmetrical; moves all extremities well; CNII-XII intact without focal deficit   Assessment:  1. PE (physical exam), annual   2. Essential hypertension   3. Lipid screening   4. Vitamin D deficiency  5. Elevated glucose     Plan:  Age appropriate preventive healthcare needs addressed; encouraged regular eye doctor and dental exams; encouraged regular exercise and weight loss; will update labs and refills as needed today; follow-up to be determined; Cologuard is ordered; Patient does have white coat hypertension- blood  pressure normalizes after patient is allowed to sit in office for 5-10 minutes; do not feel second agent is needed at this time.     No follow-ups on file.  Orders Placed This Encounter  Procedures  . CBC w/Diff    Standing Status:   Future    Standing Expiration Date:   05/24/2020  . Comp Met (CMET)    Standing Status:   Future    Standing Expiration Date:   05/24/2020  . Lipid panel    Standing Status:   Future    Standing Expiration Date:   05/24/2020  . TSH    Standing Status:   Future    Standing Expiration Date:   05/24/2020  . Vitamin D (25 hydroxy)    Standing Status:   Future    Standing Expiration Date:   05/24/2020  . HgB A1c    Standing Status:   Future    Standing Expiration Date:   05/24/2020    Requested Prescriptions   Signed Prescriptions Disp Refills  . NIFEdipine (PROCARDIA XL/NIFEDICAL-XL) 90 MG 24 hr tablet 90 tablet 3    Sig: Take 1 tablet (90 mg total) by mouth daily.

## 2019-05-28 ENCOUNTER — Other Ambulatory Visit: Payer: Self-pay | Admitting: Family

## 2019-05-28 MED ORDER — POTASSIUM CHLORIDE ER 10 MEQ PO TBCR: 10.0000 meq | EXTENDED_RELEASE_TABLET | Freq: Every day | ORAL | 0 refills | Status: DC

## 2019-05-28 MED ORDER — VITAMIN D (ERGOCALCIFEROL) 1.25 MG (50000 UNIT) PO CAPS: 50000.0000 [IU] | ORAL_CAPSULE | ORAL | 0 refills | Status: AC

## 2019-05-28 MED FILL — VIT D2 1.25 MG (50,000 UNIT: 1.25 MG | 84 days supply | Qty: 12 | Fill #0

## 2019-05-28 MED FILL — POTASSIUM CHLORIDE ER 10 ME: 10 | 5 days supply | Qty: 5 | Fill #0

## 2019-06-23 ENCOUNTER — Ambulatory Visit
Admission: RE | Admit: 2019-06-23 | Discharge: 2019-06-23 | Disposition: A | Discharge status: Home / Self Care | Payer: 59 | Source: Ambulatory Visit | Attending: Family | Admitting: Family

## 2019-06-23 ENCOUNTER — Other Ambulatory Visit: Payer: Self-pay

## 2019-06-23 DIAGNOSIS — Z1231 Encounter for screening mammogram for malignant neoplasm of breast: Secondary | ICD-10-CM

## 2019-08-06 MED FILL — NIFEdipine ER OSMOTIC RELEA: 90 | 90 days supply | Qty: 90 | Fill #0

## 2019-11-08 MED FILL — NIFEdipine ER OSMOTIC RELEA: 90 | 90 days supply | Qty: 90 | Fill #1

## 2020-01-26 MED FILL — NIFEdipine ER OSMOTIC RELEA: 90 | 90 days supply | Qty: 90 | Fill #2

## 2020-05-11 MED FILL — NIFEdipine ER OSMOTIC RELEA: 90 | 90 days supply | Qty: 90 | Fill #3

## 2020-05-15 ENCOUNTER — Other Ambulatory Visit: Payer: Self-pay | Admitting: Family

## 2020-05-15 DIAGNOSIS — Z1231 Encounter for screening mammogram for malignant neoplasm of breast: Secondary | ICD-10-CM

## 2020-06-23 ENCOUNTER — Other Ambulatory Visit: Payer: Self-pay

## 2020-06-23 ENCOUNTER — Ambulatory Visit
Admission: RE | Admit: 2020-06-23 | Discharge: 2020-06-23 | Disposition: A | Discharge status: Home / Self Care | Payer: 59 | Source: Ambulatory Visit | Attending: Family | Admitting: Family

## 2020-06-23 DIAGNOSIS — Z1231 Encounter for screening mammogram for malignant neoplasm of breast: Secondary | ICD-10-CM | POA: Diagnosis not present

## 2020-06-26 ENCOUNTER — Other Ambulatory Visit: Payer: Self-pay | Admitting: Family

## 2020-06-26 ENCOUNTER — Other Ambulatory Visit: Payer: Self-pay

## 2020-06-26 ENCOUNTER — Ambulatory Visit (INDEPENDENT_AMBULATORY_CARE_PROVIDER_SITE_OTHER): Payer: 59 | Admitting: Family

## 2020-06-26 ENCOUNTER — Encounter: Payer: Self-pay | Admitting: Family

## 2020-06-26 VITALS — BP 136/72 | HR 100 | Temp 98.1°F | Ht 64.0 in | Wt 202.0 lb

## 2020-06-26 DIAGNOSIS — Z1211 Encounter for screening for malignant neoplasm of colon: Secondary | ICD-10-CM

## 2020-06-26 DIAGNOSIS — I10 Essential (primary) hypertension: Secondary | ICD-10-CM

## 2020-06-26 DIAGNOSIS — Z0000 Encounter for general adult medical examination without abnormal findings: Secondary | ICD-10-CM | POA: Diagnosis not present

## 2020-06-26 DIAGNOSIS — R7303 Prediabetes: Secondary | ICD-10-CM | POA: Diagnosis not present

## 2020-06-26 DIAGNOSIS — Z13220 Encounter for screening for lipoid disorders: Secondary | ICD-10-CM

## 2020-06-26 LAB — CBC WITH DIFFERENTIAL/PLATELET
Basophils Absolute: 0 10*3/uL (ref 0.0–0.1)
Basophils Relative: 0.2 % (ref 0.0–3.0)
Eosinophils Absolute: 0.2 10*3/uL (ref 0.0–0.7)
Eosinophils Relative: 3.3 % (ref 0.0–5.0)
HCT: 37.1 % (ref 36.0–46.0)
Hemoglobin: 12.6 g/dL (ref 12.0–15.0)
Lymphocytes Relative: 37.4 % (ref 12.0–46.0)
Lymphs Abs: 2.1 10*3/uL (ref 0.7–4.0)
MCHC: 33.9 g/dL (ref 30.0–36.0)
MCV: 87.6 fl (ref 78.0–100.0)
Monocytes Absolute: 0.4 10*3/uL (ref 0.1–1.0)
Monocytes Relative: 6.9 % (ref 3.0–12.0)
Neutro Abs: 3 10*3/uL (ref 1.4–7.7)
Neutrophils Relative %: 52.2 % (ref 43.0–77.0)
Platelets: 370 10*3/uL (ref 150.0–400.0)
RBC: 4.24 Mil/uL (ref 3.87–5.11)
RDW: 13.5 % (ref 11.5–15.5)
WBC: 5.7 10*3/uL (ref 4.0–10.5)

## 2020-06-26 LAB — HEMOGLOBIN A1C: Hgb A1c MFr Bld: 6.2 % (ref 4.6–6.5)

## 2020-06-26 LAB — LIPID PANEL
Cholesterol: 184 mg/dL (ref 0–200)
HDL: 40.2 mg/dL (ref >39.00)
LDL Cholesterol: 117 mg/dL — ABNORMAL HIGH (ref 0–99)
NonHDL: 143.82
Total CHOL/HDL Ratio: 5
Triglycerides: 136 mg/dL (ref 0.0–149.0)
VLDL: 27.2 mg/dL (ref 0.0–40.0)

## 2020-06-26 LAB — COMPREHENSIVE METABOLIC PANEL
ALT: 10 U/L (ref 0–35)
AST: 13 U/L (ref 0–37)
Albumin: 4.2 g/dL (ref 3.5–5.2)
Alkaline Phosphatase: 54 U/L (ref 39–117)
BUN: 12 mg/dL (ref 6–23)
CO2: 27 mEq/L (ref 19–32)
Calcium: 9.3 mg/dL (ref 8.4–10.5)
Chloride: 103 mEq/L (ref 96–112)
Creatinine, Ser: 0.88 mg/dL (ref 0.40–1.20)
GFR: 74.66 mL/min (ref >60.00)
Glucose, Bld: 137 mg/dL — ABNORMAL HIGH (ref 70–99)
Potassium: 3.6 mEq/L (ref 3.5–5.1)
Sodium: 139 mEq/L (ref 135–145)
Total Bilirubin: 0.4 mg/dL (ref 0.2–1.2)
Total Protein: 8.1 g/dL (ref 6.0–8.3)

## 2020-06-26 LAB — TSH: TSH: 0.88 u[IU]/mL (ref 0.35–4.50)

## 2020-06-26 MED ORDER — NIFEDIPINE ER OSMOTIC RELEASE 90 MG PO TB24: 90.0000 mg | ORAL_TABLET | Freq: Every day | ORAL | 3 refills | Status: DC

## 2020-06-26 NOTE — Progress Notes (Signed)
Colleen Potter is a 54 y.o. female with the following history as recorded in EpicCare:  Patient Active Problem List   Diagnosis Date Noted  . Chronic venous insufficiency 05/12/2018  . Displaced trimalleolar fracture of right lower leg, initial encounter for closed fracture 08/06/2016  . Essential hypertension 10/16/2015    Current Outpatient Medications  Medication Sig Dispense Refill  . NIFEdipine (PROCARDIA XL/NIFEDICAL-XL) 90 MG 24 hr tablet Take 1 tablet (90 mg total) by mouth daily. 90 tablet 3   No current facility-administered medications for this visit.    Allergies: Patient has no known allergies.  Past Medical History:  Diagnosis Date  . Hypertension    states under control with med., has been on med. x 2 yr.  . Trimalleolar fracture of ankle, closed 08/03/2016   right    Past Surgical History:  Procedure Laterality Date  . NO PAST SURGERIES    . ORIF ANKLE FRACTURE Right 08/07/2016   Procedure: OPEN REDUCTION INTERNAL FIXATION (ORIF) RIGHT ANKLE FRACTURE;  Surgeon: Leandrew Koyanagi, MD;  Location: [facility];  Service: Orthopedics;  Laterality: Right;    History reviewed. No pertinent family history.  Social History   Tobacco Use  . Smoking status: Never Smoker  . Smokeless tobacco: Never Used  Substance Use Topics  . Alcohol use: No    Alcohol/week: 0.0 standard drinks    Subjective:   Presents today for yearly CPE; know white coat HTN- " my heart just starts beating faster when I get here." No acute concerns today; needs to see eye doctor and dentist; Notes she didn't receive Cologuard that was ordered last year- would still prefer this to colonoscopy;  Review of Systems  Constitutional: Positive for weight loss.       Feels due to walking at her job  HENT: Negative.   Eyes: Negative.   Respiratory: Negative.   Cardiovascular: Negative.   Gastrointestinal: Negative.   Genitourinary: Negative.   Musculoskeletal: Negative.   Skin: Negative.    Neurological: Negative.   Endo/Heme/Allergies: Negative.   Psychiatric/Behavioral: Negative.       Objective:  Vitals:   06/26/20 1020  BP: 136/72  Pulse: 100  Temp: 98.1 F (36.7 C)  TempSrc: Oral  SpO2: 97%  Weight: 202 lb (91.6 kg)  Height: _0  (1.626 m)    General: Well developed, well nourished, in no acute distress  Skin : Warm and dry.  Head: Normocephalic and atraumatic  Eyes: Sclera and conjunctiva clear; pupils round and reactive to light; extraocular movements intact  Ears: External normal; canals clear; tympanic membranes normal  Oropharynx: Pink, supple. No suspicious lesions  Neck: Supple without thyromegaly, adenopathy  Lungs: Respirations unlabored; clear to auscultation bilaterally without wheeze, rales, rhonchi  CVS exam: normal rate and regular rhythm.  Abdomen: Soft; nontender; nondistended; normoactive bowel sounds; no masses or hepatosplenomegaly  Musculoskeletal: No deformities; no active joint inflammation  Extremities: No edema, cyanosis, clubbing  Vessels: Symmetric bilaterally  Neurologic: Alert and oriented; speech intact; face symmetrical; moves all extremities well; CNII-XII intact without focal deficit   Assessment:  1. PE (physical exam), annual   2. Essential hypertension   3. Colon cancer screening   4. Lipid screening   5. Pre-diabetes     Plan:  Age appropriate preventive healthcare needs addressed; encouraged regular eye doctor and dental exams; encouraged regular exercise; will update labs and refills as needed today; follow-up to be determined;  This visit occurred during the SARS-CoV-2 public health emergency.  Safety protocols were in place, including screening questions prior to the visit, additional usage of staff PPE, and extensive cleaning of exam room while observing appropriate contact time as indicated for disinfecting solutions.     No follow-ups on file.  Orders Placed This Encounter  Procedures  . Cologuard  .  CBC with Differential/Platelet    Standing Status:   Future    Number of Occurrences:   1    Standing Expiration Date:   06/26/2021  . Comp Met (CMET)    Standing Status:   Future    Number of Occurrences:   1    Standing Expiration Date:   06/26/2021  . Lipid panel    Standing Status:   Future    Number of Occurrences:   1    Standing Expiration Date:   06/26/2021  . TSH    Standing Status:   Future    Number of Occurrences:   1    Standing Expiration Date:   06/26/2021  . Hemoglobin A1c    Standing Status:   Future    Number of Occurrences:   1    Standing Expiration Date:   06/26/2021    Requested Prescriptions   Signed Prescriptions Disp Refills  . NIFEdipine (PROCARDIA XL/NIFEDICAL-XL) 90 MG 24 hr tablet 90 tablet 3    Sig: Take 1 tablet (90 mg total) by mouth daily.

## 2020-06-29 ENCOUNTER — Other Ambulatory Visit: Payer: Self-pay | Admitting: Family

## 2020-06-29 DIAGNOSIS — R928 Other abnormal and inconclusive findings on diagnostic imaging of breast: Secondary | ICD-10-CM

## 2020-07-12 ENCOUNTER — Ambulatory Visit
Admission: RE | Admit: 2020-07-12 | Discharge: 2020-07-12 | Disposition: A | Discharge status: Home / Self Care | Payer: 59 | Source: Ambulatory Visit | Attending: Family | Admitting: Family

## 2020-07-12 ENCOUNTER — Other Ambulatory Visit: Payer: Self-pay | Admitting: Family

## 2020-07-12 ENCOUNTER — Other Ambulatory Visit: Payer: Self-pay

## 2020-07-12 DIAGNOSIS — R928 Other abnormal and inconclusive findings on diagnostic imaging of breast: Secondary | ICD-10-CM

## 2020-07-12 DIAGNOSIS — R590 Localized enlarged lymph nodes: Secondary | ICD-10-CM | POA: Diagnosis not present

## 2020-07-24 MED FILL — NIFEdipine ER OSMOTIC RELEA: 90 | 90 days supply | Qty: 90 | Fill #0

## 2020-08-07 MED FILL — NIFEdipine ER OSMOTIC RELEA: 90 | 90 days supply | Qty: 90 | Fill #0

## 2020-08-28 ENCOUNTER — Other Ambulatory Visit: Payer: Self-pay | Admitting: Orthopaedic Surgery

## 2020-08-28 MED ORDER — MELOXICAM 7.5 MG PO TABS: 7.5000 mg | ORAL_TABLET | Freq: Two times a day (BID) | ORAL | 2 refills | Status: DC | PRN

## 2020-08-28 MED ORDER — PENNSAID 2 % EX SOLN: 2.0000 g | Freq: Two times a day (BID) | CUTANEOUS | 3 refills | Status: DC | PRN

## 2020-08-28 MED FILL — MELOXICAM 7.5 MG TABLET: 7.5 | 15 days supply | Qty: 30 | Fill #0

## 2020-11-06 ENCOUNTER — Telehealth: Payer: Self-pay | Admitting: Family

## 2020-11-06 ENCOUNTER — Other Ambulatory Visit (HOSPITAL_COMMUNITY): Payer: Self-pay

## 2020-11-06 DIAGNOSIS — I10 Essential (primary) hypertension: Secondary | ICD-10-CM

## 2020-11-06 MED ORDER — NIFEDIPINE ER OSMOTIC RELEASE 90 MG PO TB24
90.0000 mg | ORAL_TABLET | Freq: Every day | ORAL | 1 refills | Status: DC
  Filled 2020-11-06 – 2020-11-14 (×2): qty 90, 90d supply, fill #0

## 2020-11-06 NOTE — Telephone Encounter (Signed)
Medication:[id number]  NIFEdipine (PROCARDIA XL/NIFEDICAL-XL) 90 MG 24 hr tablet [[id number]]       Has the patient contacted their pharmacy? no (If no, request that the patient contact the pharmacy for the refill.) (If yes, when and what did the pharmacy advise?)    Preferred Pharmacy (with phone number or street name):  [facility] Phone:  [phone]  Fax:  [phone]         Agent: Please be advised that RX refills may take up to 3 business days. We ask that you follow-up with your pharmacy.

## 2020-11-06 NOTE — Telephone Encounter (Signed)
I have called the pt back and she is going to follow Mickel Baas to [facility] and we have a 40 m f/u appt scheduled for 01/02/21 @ 8:20 am. Pt confirmed and medication has been sent to pharmacy.

## 2020-11-13 ENCOUNTER — Other Ambulatory Visit (HOSPITAL_COMMUNITY): Payer: Self-pay

## 2020-11-14 ENCOUNTER — Other Ambulatory Visit (HOSPITAL_COMMUNITY): Payer: Self-pay

## 2021-01-02 ENCOUNTER — Ambulatory Visit: Payer: 59 | Admitting: Family

## 2021-01-02 ENCOUNTER — Other Ambulatory Visit: Payer: Self-pay

## 2021-01-02 ENCOUNTER — Other Ambulatory Visit (HOSPITAL_COMMUNITY): Payer: Self-pay

## 2021-01-02 VITALS — BP 134/62 | HR 95 | Temp 98.2°F | Ht 64.0 in | Wt 200.0 lb

## 2021-01-02 DIAGNOSIS — Z13220 Encounter for screening for lipoid disorders: Secondary | ICD-10-CM

## 2021-01-02 DIAGNOSIS — R7303 Prediabetes: Secondary | ICD-10-CM | POA: Diagnosis not present

## 2021-01-02 DIAGNOSIS — I10 Essential (primary) hypertension: Secondary | ICD-10-CM

## 2021-01-02 LAB — HEMOGLOBIN A1C: Hgb A1c MFr Bld: 6.5 % (ref 4.6–6.5)

## 2021-01-02 LAB — BASIC METABOLIC PANEL
BUN: 21 mg/dL (ref 6–23)
CO2: 26 mEq/L (ref 19–32)
Calcium: 9.5 mg/dL (ref 8.4–10.5)
Chloride: 102 mEq/L (ref 96–112)
Creatinine, Ser: 1.03 mg/dL (ref 0.40–1.20)
GFR: 61.59 mL/min (ref >60.00)
Glucose, Bld: 121 mg/dL — ABNORMAL HIGH (ref 70–99)
Potassium: 4 mEq/L (ref 3.5–5.1)
Sodium: 137 mEq/L (ref 135–145)

## 2021-01-02 LAB — LIPID PANEL
Cholesterol: 182 mg/dL (ref 0–200)
HDL: 38.6 mg/dL — ABNORMAL LOW (ref >39.00)
LDL Cholesterol: 105 mg/dL — ABNORMAL HIGH (ref 0–99)
NonHDL: 143.52
Total CHOL/HDL Ratio: 5
Triglycerides: 194 mg/dL — ABNORMAL HIGH (ref 0.0–149.0)
VLDL: 38.8 mg/dL (ref 0.0–40.0)

## 2021-01-02 MED ORDER — NIFEDIPINE ER OSMOTIC RELEASE 90 MG PO TB24
90.0000 mg | ORAL_TABLET | Freq: Every day | ORAL | 2 refills | Status: DC
Start: 2021-01-02 — End: 2021-09-27
  Filled 2021-01-02 – 2021-02-12 (×2): qty 90, 90d supply, fill #0
  Filled 2021-05-24: qty 90, 90d supply, fill #1
  Filled 2021-08-06: qty 90, 90d supply, fill #2

## 2021-01-02 NOTE — Progress Notes (Signed)
  Colleen Potter is a 55 y.o. female with the following history as recorded in EpicCare:  Patient Active Problem List   Diagnosis Date Noted   Chronic venous insufficiency 05/12/2018   Displaced trimalleolar fracture of right lower leg, initial encounter for closed fracture 08/06/2016   Essential hypertension 10/16/2015    Current Outpatient Medications  Medication Sig Dispense Refill   NIFEdipine (PROCARDIA XL/NIFEDICAL-XL) 90 MG 24 hr tablet Take 1 tablet (90 mg total) by mouth daily. 90 tablet 2   No current facility-administered medications for this visit.    Allergies: Patient has no known allergies.  Past Medical History:  Diagnosis Date   Hypertension    states under control with med., has been on med. x 2 yr.   Trimalleolar fracture of ankle, closed 08/03/2016   right    Past Surgical History:  Procedure Laterality Date   NO PAST SURGERIES     ORIF ANKLE FRACTURE Right 08/07/2016   Procedure: OPEN REDUCTION INTERNAL FIXATION (ORIF) RIGHT ANKLE FRACTURE;  Surgeon: Leandrew Koyanagi, MD;  Location: [facility];  Service: Orthopedics;  Laterality: Right;    No family history on file.  Social History   Tobacco Use   Smoking status: Never   Smokeless tobacco: Never  Substance Use Topics   Alcohol use: No    Alcohol/week: 0.0 standard drinks    Subjective:  6 months follow up on pre-diabetes/ elevated cholesterol; no acute concerns today; Denies any chest pain, shortness of breath, blurred vision or headache   Objective:  Vitals:   01/02/21 0816  BP: 134/62  Pulse: 95  Temp: 98.2 F (36.8 C)  SpO2: 98%  Weight: 200 lb (90.7 kg)  Height: 5\' 4"  (1.626 m)    General: Well developed, well nourished, in no acute distress  Skin : Warm and dry.  Head: Normocephalic and atraumatic  Lungs: Respirations unlabored; clear to auscultation bilaterally without wheeze, rales, rhonchi  CVS exam: normal rate and regular rhythm.  Neurologic: Alert and oriented; speech  intact; face symmetrical; moves all extremities well; CNII-XII intact without focal deficit   Assessment:  1. Pre-diabetes   2. Lipid screening   3. Essential hypertension     Plan:  Update labs today; patient has lost weight since last OV; continue commitment to limiting intake of processed sugars. Lipids updated today; Stable; continue same medication;  This visit occurred during the SARS-CoV-2 public health emergency.  Safety protocols were in place, including screening questions prior to the visit, additional usage of staff PPE, and extensive cleaning of exam room while observing appropriate contact time as indicated for disinfecting solutions.    No follow-ups on file.  Orders Placed This Encounter  Procedures   Lipid panel   Basic Metabolic Panel (BMET)   Hemoglobin A1c    Requested Prescriptions   Signed Prescriptions Disp Refills   NIFEdipine (PROCARDIA XL/NIFEDICAL-XL) 90 MG 24 hr tablet 90 tablet 2    Sig: Take 1 tablet (90 mg total) by mouth daily.

## 2021-02-12 ENCOUNTER — Other Ambulatory Visit (HOSPITAL_COMMUNITY): Payer: Self-pay

## 2021-02-14 ENCOUNTER — Other Ambulatory Visit (HOSPITAL_COMMUNITY): Payer: Self-pay

## 2021-03-10 IMAGING — MG DIGITAL SCREENING BILAT W/ TOMO W/ CAD
6 of 10 series · 6 of 30 positions shown · non-contrast
Comparison: Previous exam(s).

CLINICAL DATA: Screening.

EXAM:
DIGITAL SCREENING BILATERAL MAMMOGRAM WITH TOMO AND CAD

[R MLO synth-2D (1 of 2)]
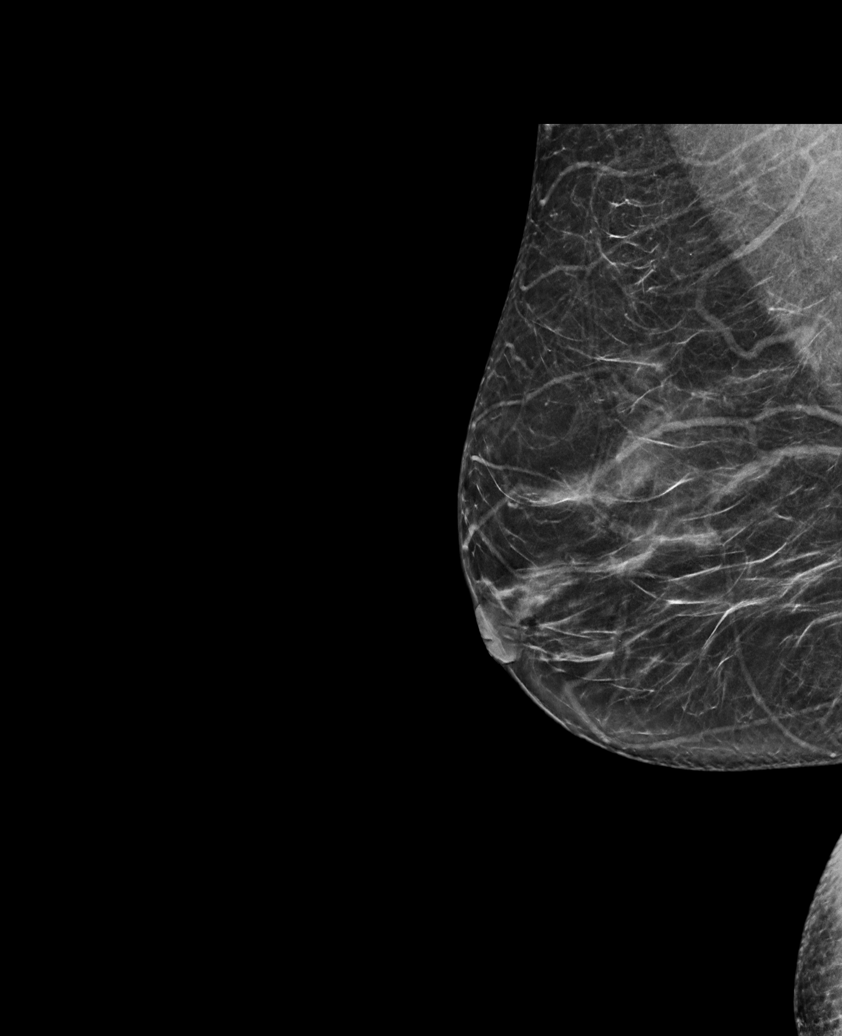

[L MLO synth-2D]
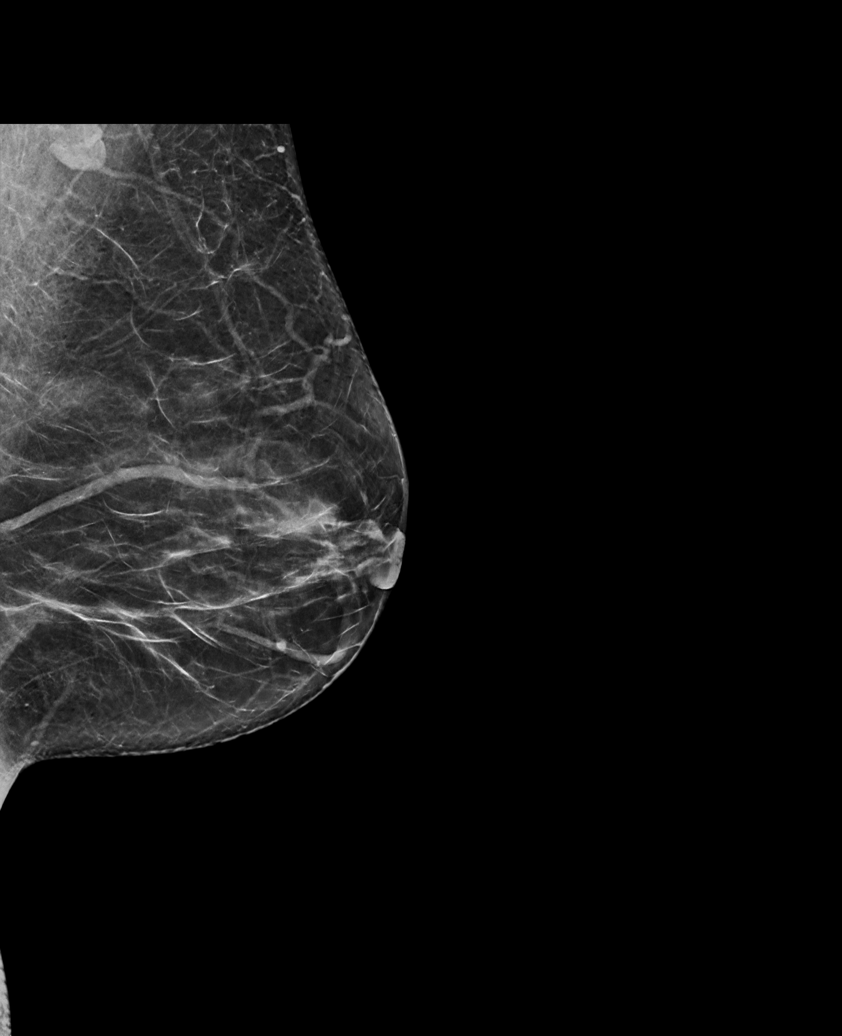

[R CC synth-2D]
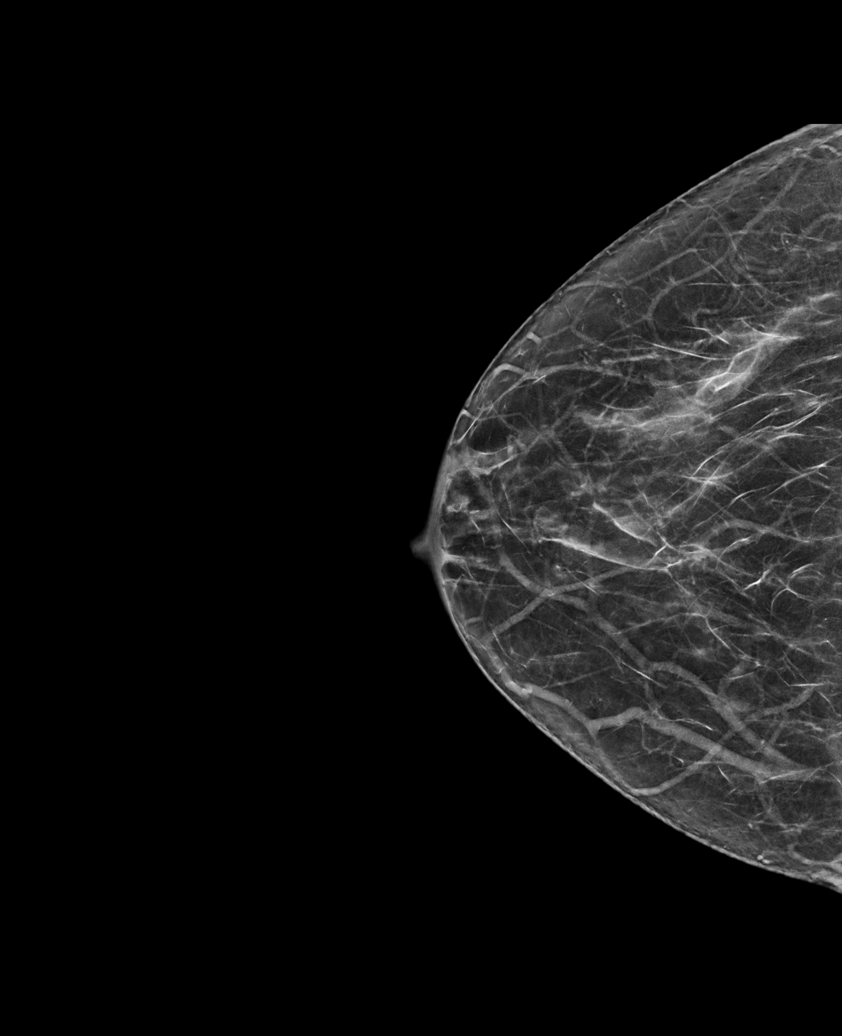

[R MLO synth-2D (2 of 2)]
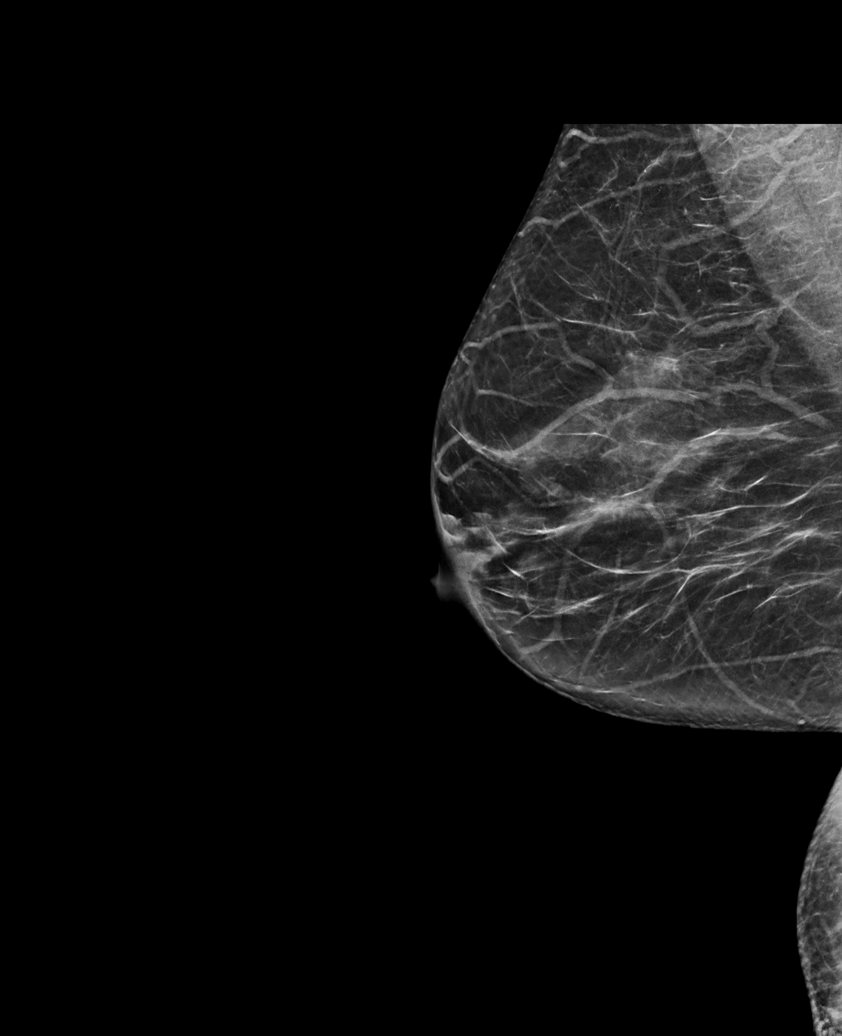

[L CC synth-2D]
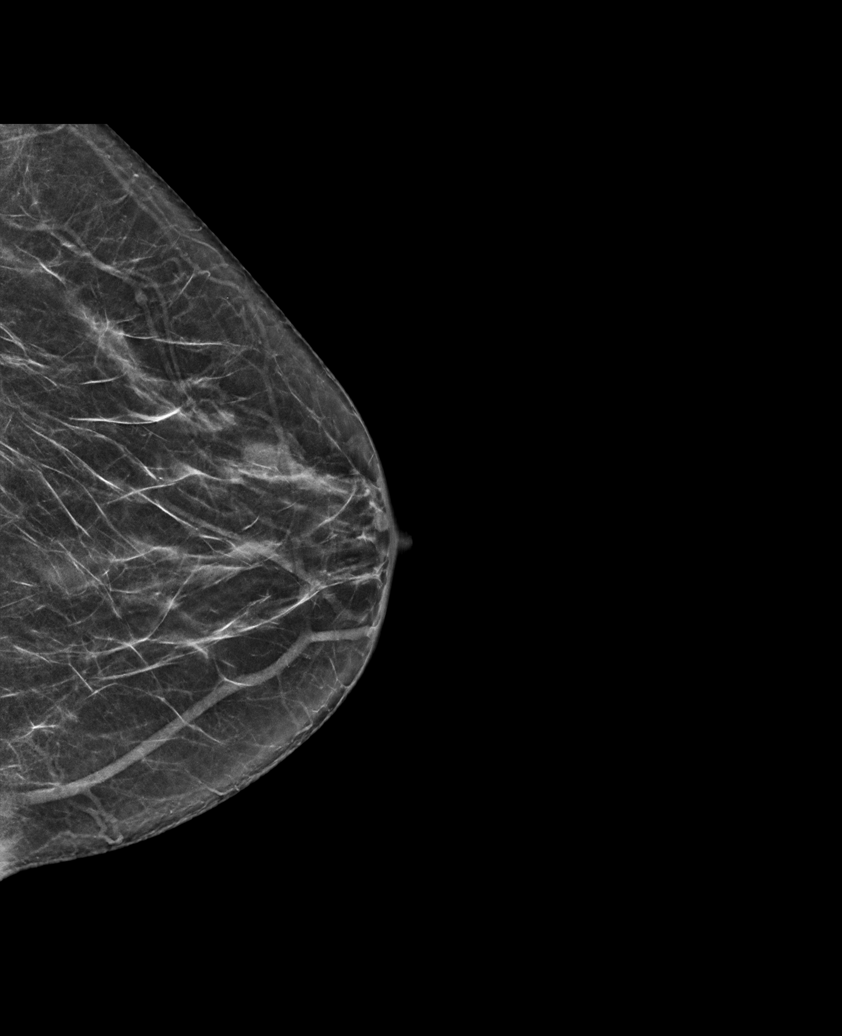

[R CC tomo · tomo slice 33/65.0]
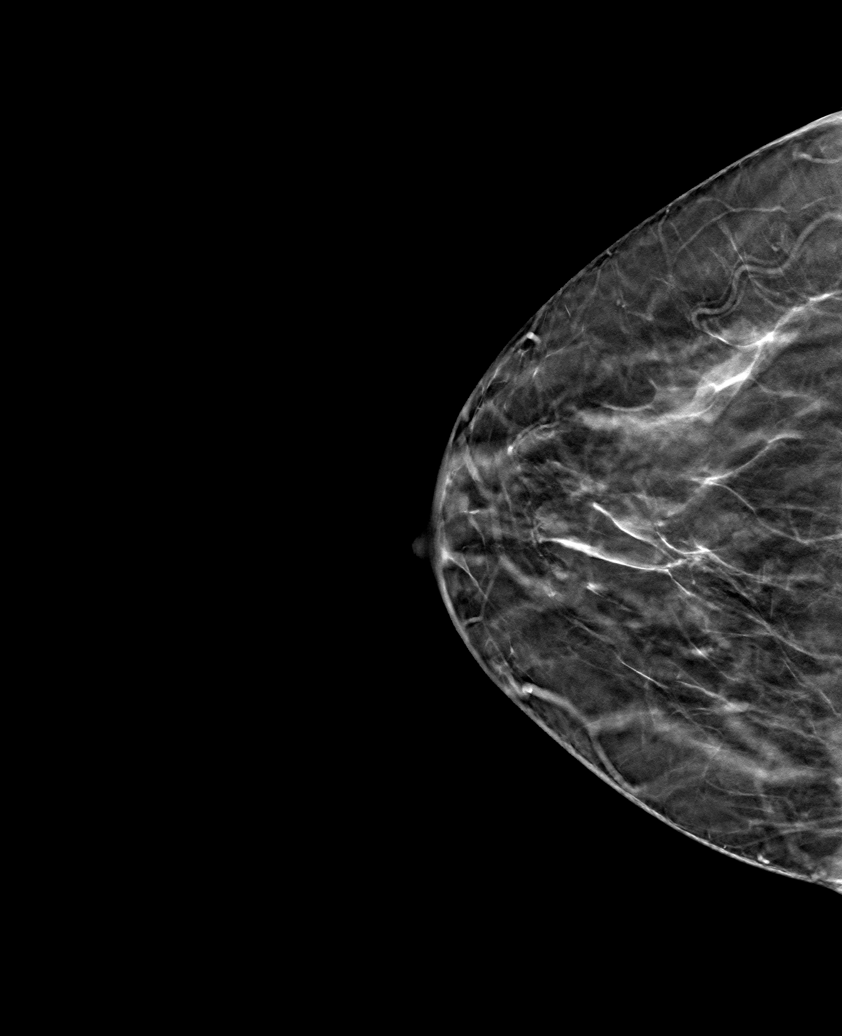

[6 of 30 positions shown; findings below may reference images not displayed]

ACR Breast Density Category b: There are scattered areas of
fibroglandular density.
FINDINGS: Possible enlarged RIGHT axillary lymph nodes require further
evaluation.

Possible enlarged LEFT axillary lymph nodes require further
evaluation.

Images were processed with CAD.
IMPRESSION: Further evaluation is suggested for possible RIGHT axillary
adenopathy.

Further evaluation is suggested for possible LEFT axillary
adenopathy.

RECOMMENDATION:
Diagnostic mammogram and possibly ultrasound of both breasts.
(Code:WS-5-MMP)

The patient will be contacted regarding the findings, and additional
imaging will be scheduled.

BI-RADS CATEGORY  0: Incomplete. Need additional imaging evaluation
and/or prior mammograms for comparison.

## 2021-03-29 IMAGING — US US AXILLARY RIGHT
1 series · 6 of 6 positions shown · non-contrast
Comparison: Previous exam(s).

CLINICAL DATA: 54-year-old female for further evaluation of
possible enlarged bilateral axillary lymph nodes on screening
mammogram.

EXAM:
ULTRASOUND OF THE BILATERAL AXILLA

[Series 1: us axillary right · 0.07mm/px · 6 of 6 slices shown]
[im 1/6]
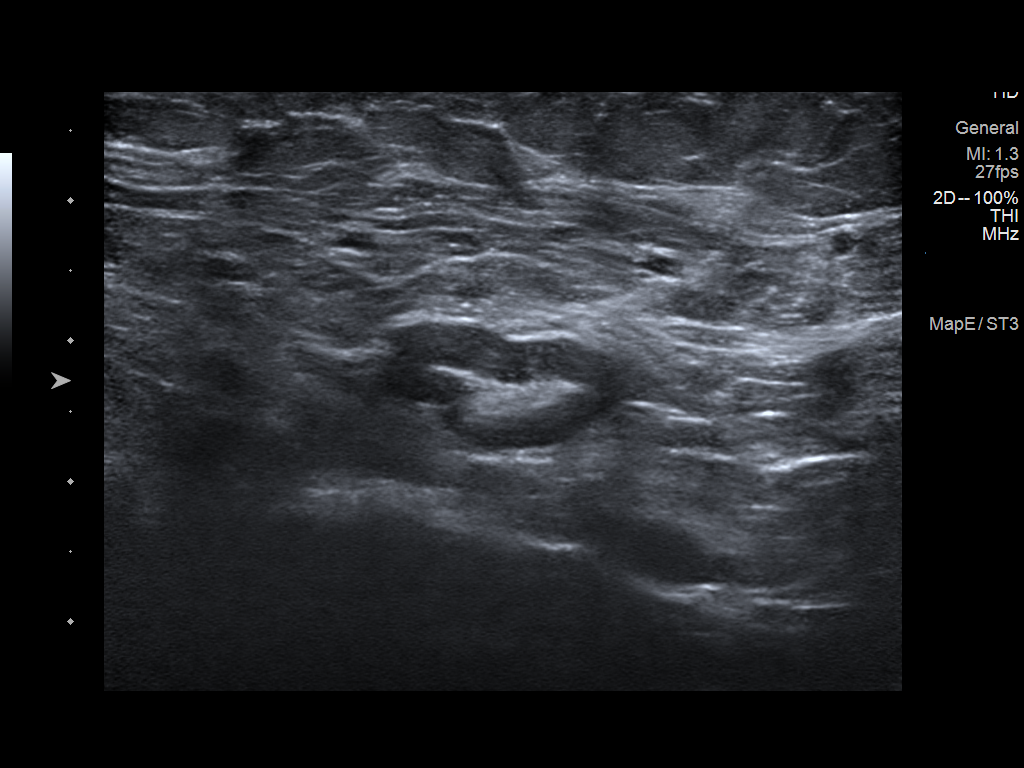
[im 2/6]
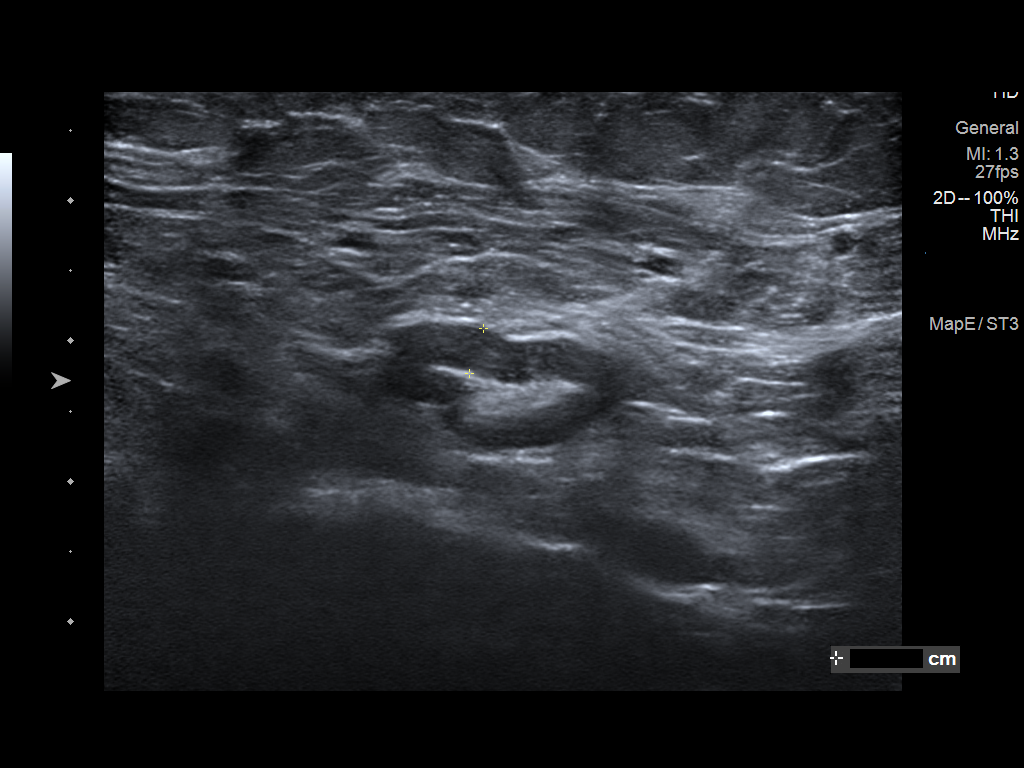
[im 3/6]
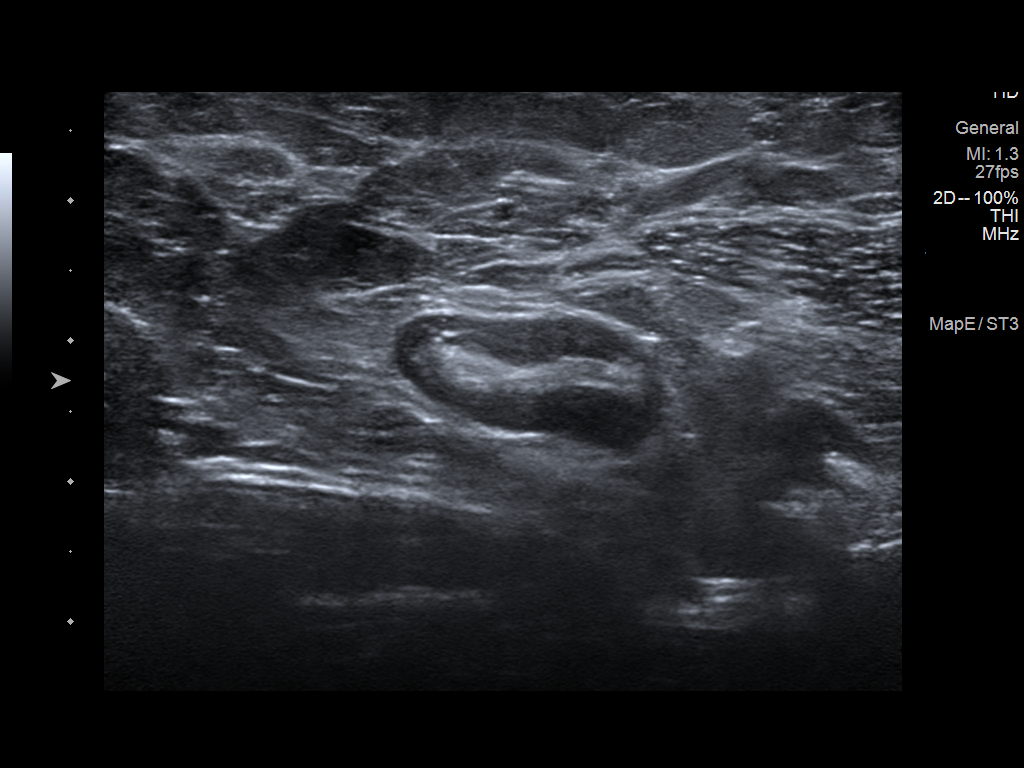
[im 4/6]
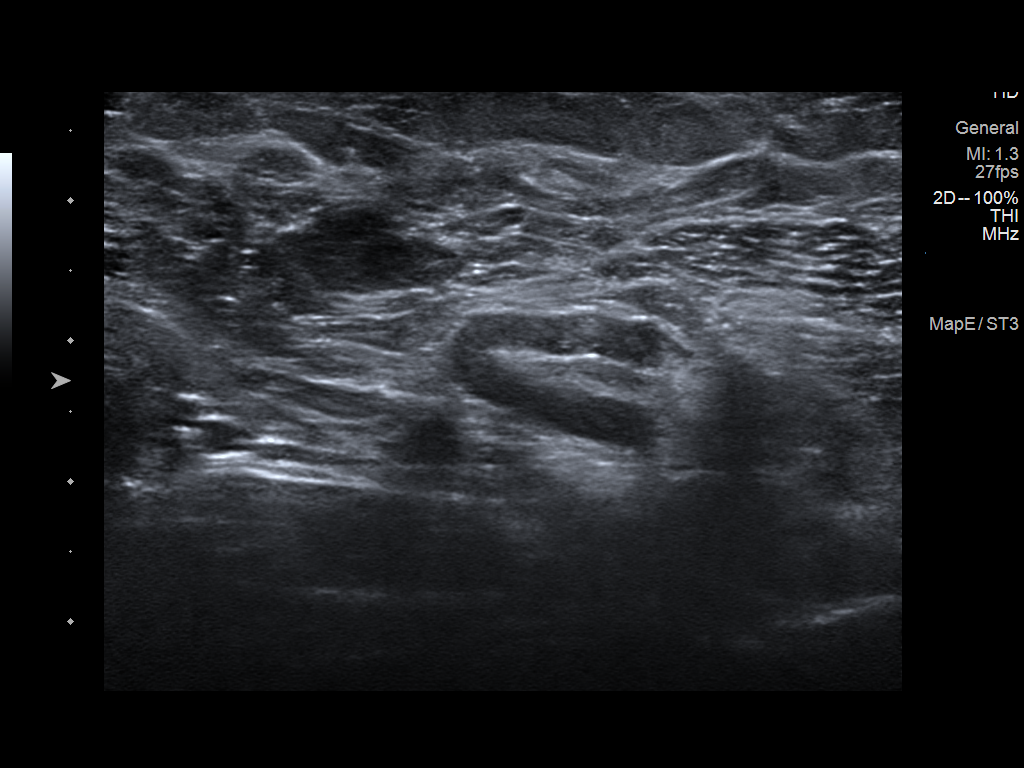
[im 5/6]
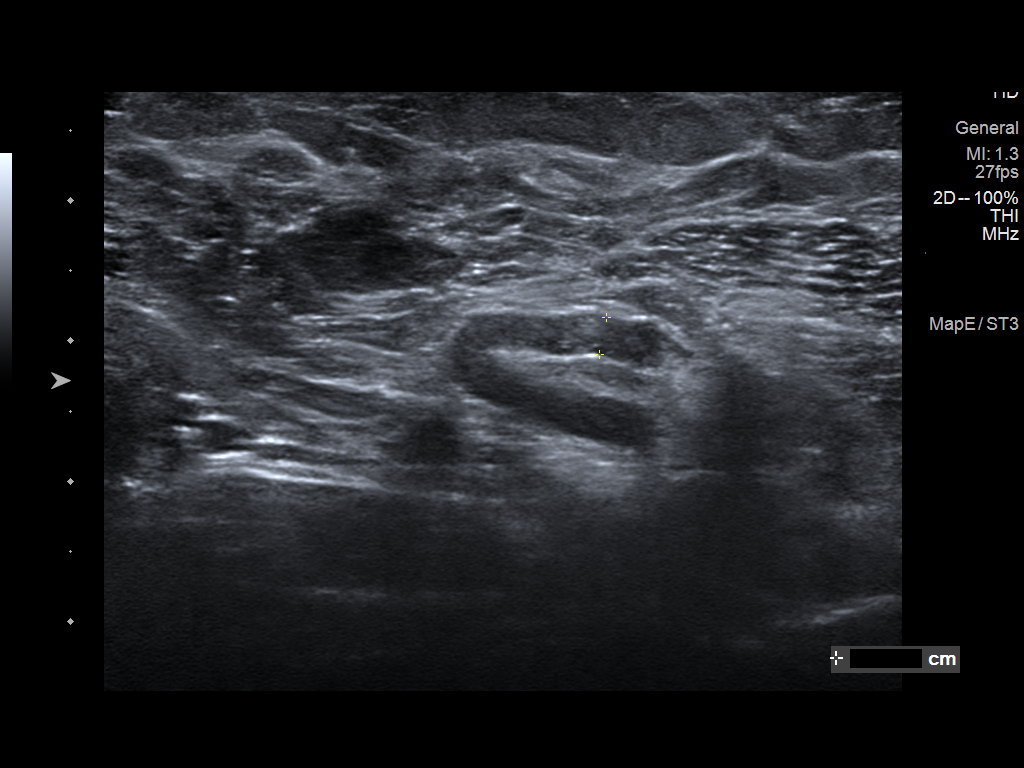
[im 6/6]
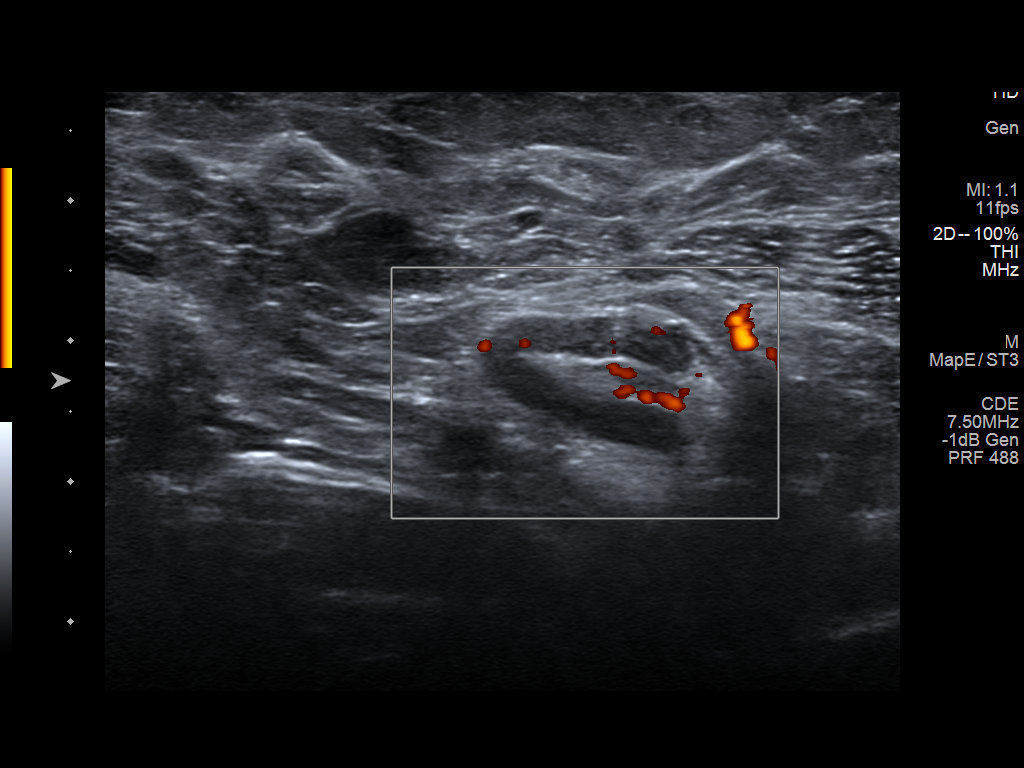

[6 of 6 positions shown; findings below may reference images not displayed]

FINDINGS: Ultrasound is performed, showing normal appearing bilateral axillary
lymph nodes with maximal cortical thickness of 3 mm. No suspicious
axillary lymph nodes are identified.
IMPRESSION: No abnormal appearing lymph nodes within the axillary regions.

RECOMMENDATION:
Bilateral screening mammogram in 1 year.

I have discussed the findings and recommendations with the patient.
If applicable, a reminder letter will be sent to the patient
regarding the next appointment.

BI-RADS CATEGORY  2: Benign.

## 2021-03-29 IMAGING — US US AXILLARY LEFT
1 series · 8 of 8 positions shown · non-contrast
Comparison: Previous exam(s).

CLINICAL DATA: 54-year-old female for further evaluation of
possible enlarged bilateral axillary lymph nodes on screening
mammogram.

EXAM:
ULTRASOUND OF THE BILATERAL AXILLA

[Series 1: us axillary left · 0.07mm/px · 8 of 8 slices shown]
[im 1/8]
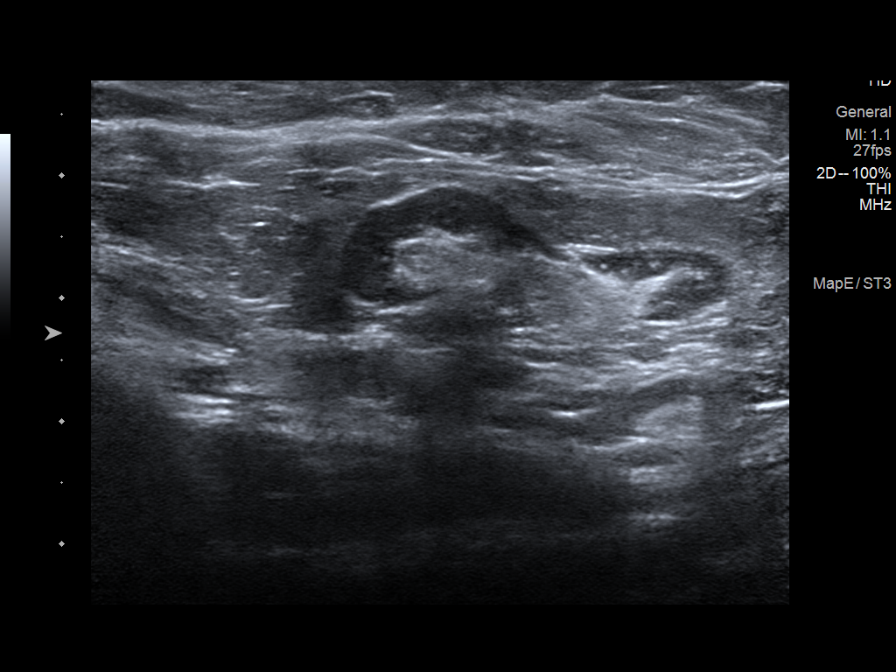
[im 2/8]
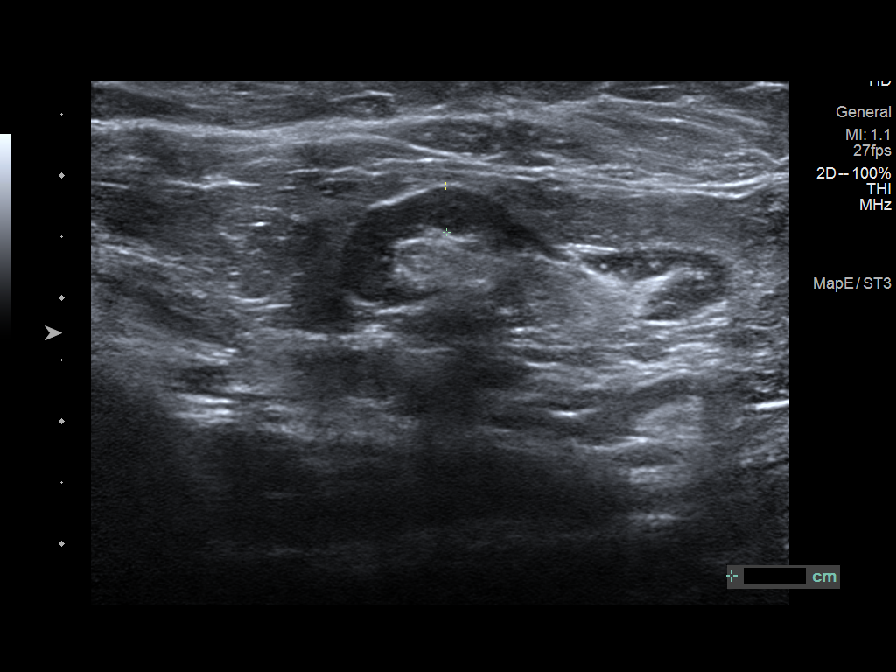
[im 3/8]
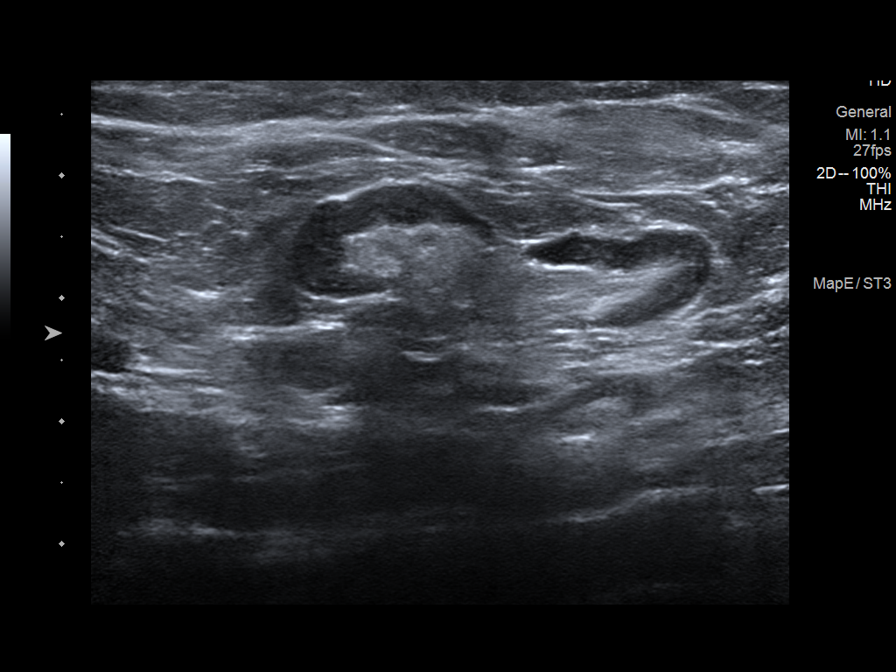
[im 4/8]
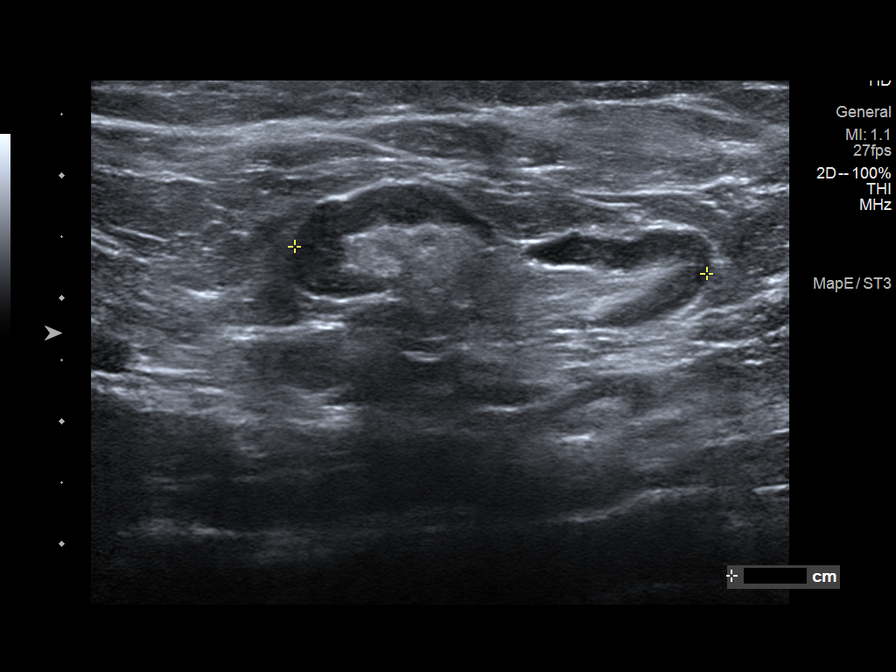
[im 5/8]
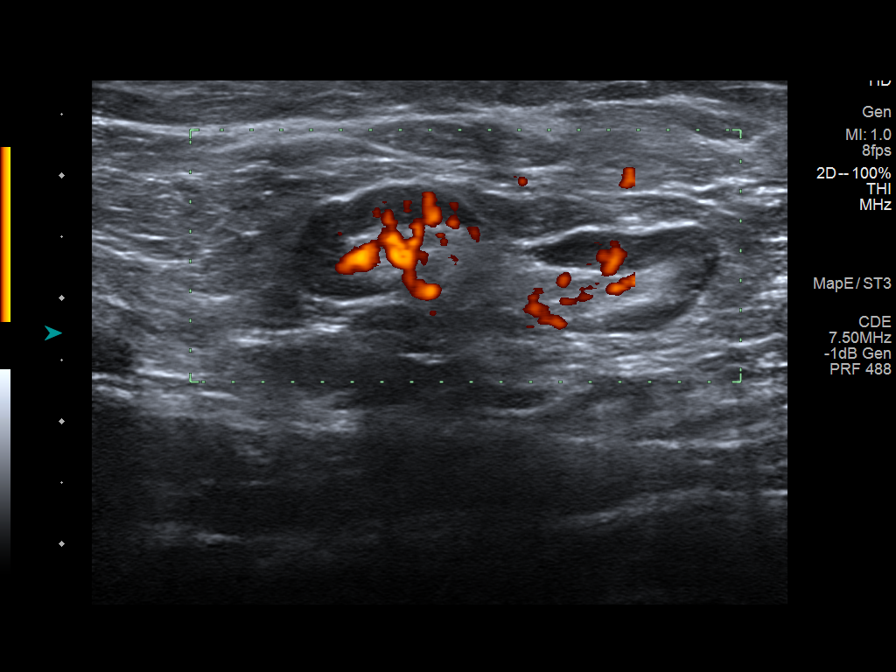
[im 6/8]
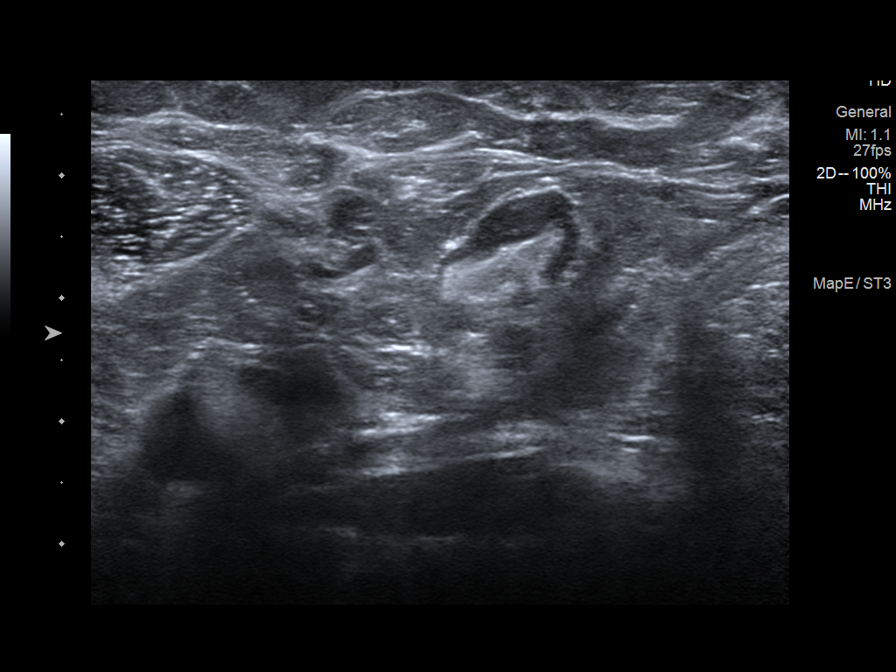
[im 7/8]
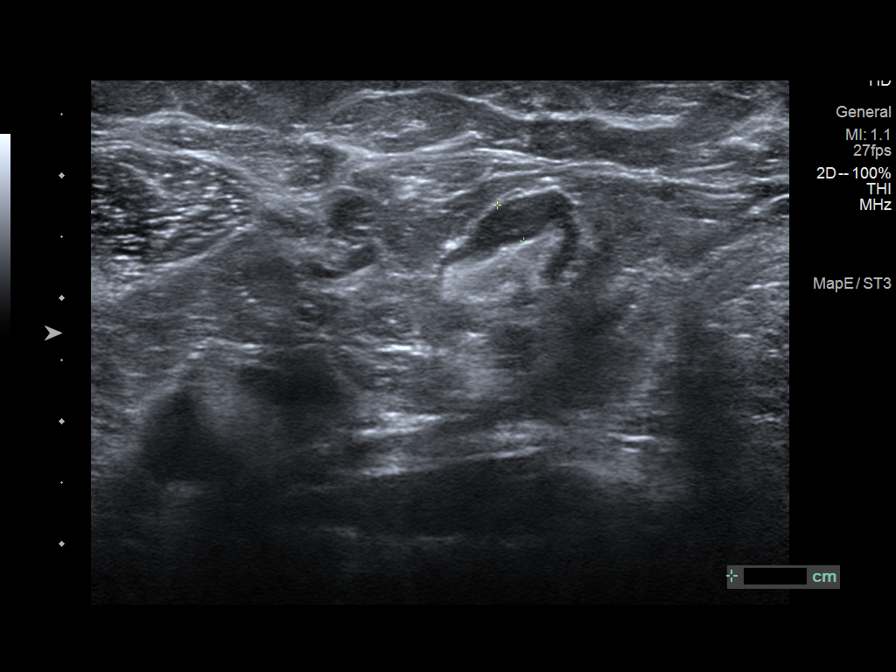
[im 8/8]
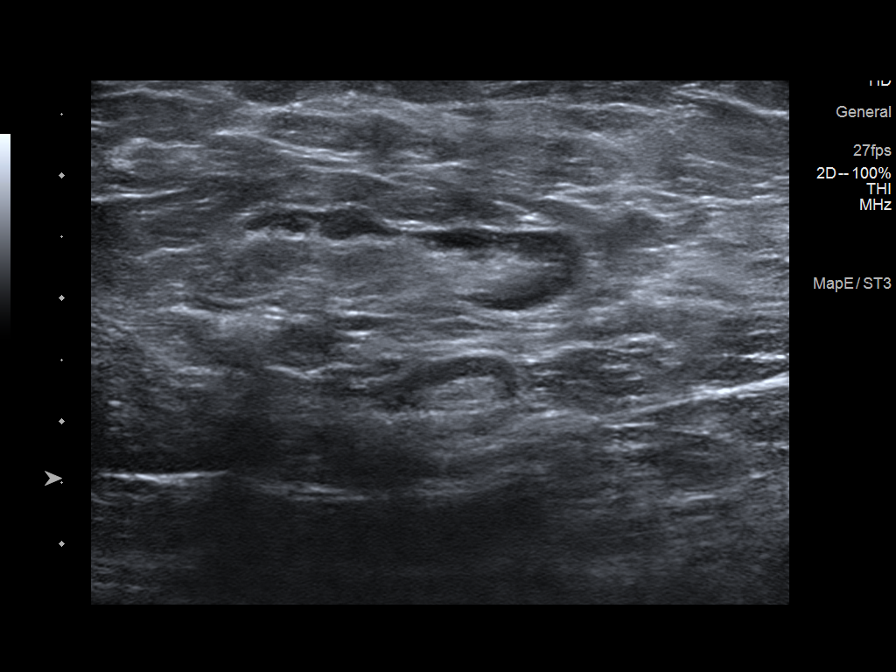

[8 of 8 positions shown; findings below may reference images not displayed]

FINDINGS: Ultrasound is performed, showing normal appearing bilateral axillary
lymph nodes with maximal cortical thickness of 3 mm. No suspicious
axillary lymph nodes are identified.
IMPRESSION: No abnormal appearing lymph nodes within the axillary regions.

RECOMMENDATION:
Bilateral screening mammogram in 1 year.

I have discussed the findings and recommendations with the patient.
If applicable, a reminder letter will be sent to the patient
regarding the next appointment.

BI-RADS CATEGORY  2: Benign.

## 2021-05-03 ENCOUNTER — Other Ambulatory Visit: Payer: Self-pay

## 2021-05-03 ENCOUNTER — Ambulatory Visit: Payer: Self-pay

## 2021-05-03 ENCOUNTER — Other Ambulatory Visit (HOSPITAL_COMMUNITY): Payer: Self-pay

## 2021-05-03 ENCOUNTER — Ambulatory Visit (INDEPENDENT_AMBULATORY_CARE_PROVIDER_SITE_OTHER): Payer: 59 | Admitting: Orthopaedic Surgery

## 2021-05-03 ENCOUNTER — Encounter: Payer: Self-pay | Admitting: Orthopaedic Surgery

## 2021-05-03 DIAGNOSIS — M25571 Pain in right ankle and joints of right foot: Secondary | ICD-10-CM | POA: Diagnosis not present

## 2021-05-03 MED ORDER — PREDNISONE 10 MG (21) PO TBPK
ORAL_TABLET | ORAL | 3 refills | Status: DC
  Filled 2021-05-03: qty 21, 6d supply, fill #0

## 2021-05-03 NOTE — Progress Notes (Signed)
Office Visit Note   Patient: Colleen Potter           Date of Birth: [birth year]           MRN: [id number] Visit Date: 05/03/2021              Requested by: Marrian Salvage, [address],  [place] [address] PCP: Marrian Salvage, FNP   Assessment & Plan: Visit Diagnoses:  1. Pain in right ankle and joints of right foot     Plan: Impression is chronic right ankle and foot pain.  Feel that this is due to combination arthritis as well as swelling.  I would recommend immobilization with a cam boot as well as relative rest, compression sock, prednisone Dosepak.  No structural or focal findings that I am concerned about.  We will see her back as needed.  Follow-Up Instructions: No follow-ups on file.   Orders:  Orders Placed This Encounter  Procedures   XR Ankle Complete Right   XR Foot Complete Right   Meds ordered this encounter  Medications   predniSONE (STERAPRED UNI-PAK 21 TAB) 10 MG (21) TBPK tablet    Sig: Take as directed    Dispense:  21 tablet    Refill:  3      Procedures: No procedures performed   Clinical Data: No additional findings.   Subjective: Chief Complaint  Patient presents with   Right Foot - Pain    +HEEL PAIN   Right Ankle - Pain    Patient is a very pleasant 55 year old female who works at [employer] comes in for chronic right foot and ankle pain.  She endorses plantar foot pain as well as anterolateral foot and ankle pain.  She has tried meloxicam for this without any relief.  Denies any injuries.  The pain comes and goes and is worse at the end of the day and with swelling.   Review of Systems  Constitutional: Negative.   HENT: Negative.    Eyes: Negative.   Respiratory: Negative.    Cardiovascular: Negative.   Endocrine: Negative.   Musculoskeletal: Negative.   Neurological: Negative.   Hematological: Negative.   Psychiatric/Behavioral: Negative.    All other systems reviewed and  are negative.   Objective: Vital Signs: There were no vitals taken for this visit.  Physical Exam Vitals and nursing note reviewed.  Constitutional:      Appearance: She is well-developed.  Pulmonary:     Effort: Pulmonary effort is normal.  Skin:    General: Skin is warm.     Capillary Refill: Capillary refill takes less than 2 seconds.  Neurological:     Mental Status: She is alert and oriented to person, place, and time.  Psychiatric:        Behavior: Behavior normal.        Thought Content: Thought content normal.        Judgment: Judgment normal.    Ortho Exam Right foot and ankle show fully healed surgical scars.  She does have moderate swelling and pitting edema.  Strong DP pulse.  Normal capillary refill.  She has slight tenderness throughout the ankle and the mid substance of the plantar fascia.  Range of motion of the ankle and subtalar joints are well-tolerated and supple.  Specialty Comments:  No specialty comments available.  Imaging: XR Ankle Complete Right  Result Date: 05/03/2021 Stable hardware from prior ORIF of the ankle.  Spurring of  the ankle joint as well as the syndesmosis.  No hardware complications.  XR Foot Complete Right  Result Date: 05/03/2021 Widespread mild degenerative changes.  Spurring of the calcaneus.    PMFS History: Patient Active Problem List   Diagnosis Date Noted   Chronic venous insufficiency 05/12/2018   Displaced trimalleolar fracture of right lower leg, initial encounter for closed fracture 08/06/2016   Essential hypertension 10/16/2015   Past Medical History:  Diagnosis Date   Hypertension    states under control with med., has been on med. x 2 yr.   Trimalleolar fracture of ankle, closed 08/03/2016   right    History reviewed. No pertinent family history.  Past Surgical History:  Procedure Laterality Date   NO PAST SURGERIES     ORIF ANKLE FRACTURE Right 08/07/2016   Procedure: OPEN REDUCTION INTERNAL FIXATION  (ORIF) RIGHT ANKLE FRACTURE;  Surgeon: Leandrew Koyanagi, MD;  Location: [facility];  Service: Orthopedics;  Laterality: Right;   Social History   Occupational History   Occupation: EDS  Tobacco Use   Smoking status: Never   Smokeless tobacco: Never  Substance and Sexual Activity   Alcohol use: No    Alcohol/week: 0.0 standard drinks   Drug use: No   Sexual activity: Not on file

## 2021-05-24 ENCOUNTER — Other Ambulatory Visit (HOSPITAL_COMMUNITY): Payer: Self-pay

## 2021-07-03 ENCOUNTER — Other Ambulatory Visit: Payer: Self-pay | Admitting: Family

## 2021-07-03 DIAGNOSIS — Z1231 Encounter for screening mammogram for malignant neoplasm of breast: Secondary | ICD-10-CM

## 2021-07-05 ENCOUNTER — Ambulatory Visit: Payer: 59 | Admitting: Family

## 2021-08-01 ENCOUNTER — Ambulatory Visit
Admission: RE | Admit: 2021-08-01 | Discharge: 2021-08-01 | Disposition: A | Discharge status: Home / Self Care | Payer: 59 | Source: Ambulatory Visit | Attending: Family | Admitting: Family

## 2021-08-01 ENCOUNTER — Other Ambulatory Visit: Payer: Self-pay

## 2021-08-01 DIAGNOSIS — Z1231 Encounter for screening mammogram for malignant neoplasm of breast: Secondary | ICD-10-CM

## 2021-08-06 ENCOUNTER — Other Ambulatory Visit (HOSPITAL_COMMUNITY): Payer: Self-pay

## 2021-09-27 ENCOUNTER — Ambulatory Visit (HOSPITAL_BASED_OUTPATIENT_CLINIC_OR_DEPARTMENT_OTHER)
Admission: RE | Admit: 2021-09-27 | Discharge: 2021-09-27 | Disposition: A | Discharge status: Home / Self Care | Payer: 59 | Source: Ambulatory Visit | Attending: Family | Admitting: Family

## 2021-09-27 ENCOUNTER — Other Ambulatory Visit: Payer: Self-pay | Admitting: Family

## 2021-09-27 ENCOUNTER — Other Ambulatory Visit: Payer: Self-pay

## 2021-09-27 ENCOUNTER — Other Ambulatory Visit (HOSPITAL_COMMUNITY): Payer: Self-pay

## 2021-09-27 ENCOUNTER — Encounter: Payer: Self-pay | Admitting: Family

## 2021-09-27 ENCOUNTER — Ambulatory Visit: Payer: 59 | Admitting: Family

## 2021-09-27 VITALS — BP 138/80 | HR 87 | Temp 98.0°F | Ht 64.0 in | Wt 200.2 lb

## 2021-09-27 DIAGNOSIS — I10 Essential (primary) hypertension: Secondary | ICD-10-CM

## 2021-09-27 DIAGNOSIS — Z1159 Encounter for screening for other viral diseases: Secondary | ICD-10-CM | POA: Diagnosis not present

## 2021-09-27 DIAGNOSIS — R7309 Other abnormal glucose: Secondary | ICD-10-CM | POA: Diagnosis not present

## 2021-09-27 DIAGNOSIS — M25562 Pain in left knee: Secondary | ICD-10-CM

## 2021-09-27 DIAGNOSIS — I83813 Varicose veins of bilateral lower extremities with pain: Secondary | ICD-10-CM

## 2021-09-27 LAB — COMPREHENSIVE METABOLIC PANEL
ALT: 8 U/L (ref 0–35)
AST: 9 U/L (ref 0–37)
Albumin: 4.3 g/dL (ref 3.5–5.2)
Alkaline Phosphatase: 55 U/L (ref 39–117)
BUN: 17 mg/dL (ref 6–23)
CO2: 29 mEq/L (ref 19–32)
Calcium: 9.6 mg/dL (ref 8.4–10.5)
Chloride: 102 mEq/L (ref 96–112)
Creatinine, Ser: 0.91 mg/dL (ref 0.40–1.20)
GFR: 71.09 mL/min (ref >60.00)
Glucose, Bld: 118 mg/dL — ABNORMAL HIGH (ref 70–99)
Potassium: 3.6 mEq/L (ref 3.5–5.1)
Sodium: 139 mEq/L (ref 135–145)
Total Bilirubin: 0.3 mg/dL (ref 0.2–1.2)
Total Protein: 8 g/dL (ref 6.0–8.3)

## 2021-09-27 LAB — HEMOGLOBIN A1C: Hgb A1c MFr Bld: 6.4 % (ref 4.6–6.5)

## 2021-09-27 MED ORDER — NIFEDIPINE ER OSMOTIC RELEASE 90 MG PO TB24
90.0000 mg | ORAL_TABLET | Freq: Every day | ORAL | 2 refills | Status: DC
  Filled 2021-09-27 – 2021-11-19 (×2): qty 90, 90d supply, fill #0
  Filled 2022-02-25: qty 90, 90d supply, fill #1

## 2021-09-27 NOTE — Patient Instructions (Signed)
Please start checking your blood pressure as we discussed; please call back if it is consistently above 140/90;  ?

## 2021-09-27 NOTE — Progress Notes (Signed)
?Colleen Potter is a 56 y.o. female with the following history as recorded in EpicCare:  ?Patient Active Problem List  ? Diagnosis Date Noted  ? Chronic venous insufficiency 05/12/2018  ? Displaced trimalleolar fracture of right lower leg, initial encounter for closed fracture 08/06/2016  ? Essential hypertension 10/16/2015  ?  ?Current Outpatient Medications  ?Medication Sig Dispense Refill  ? NIFEdipine (PROCARDIA XL/NIFEDICAL-XL) 90 MG 24 hr tablet Take 1 tablet (90 mg total) by mouth daily. 90 tablet 2  ? ?No current facility-administered medications for this visit.  ?  ?Allergies: Patient has no known allergies.  ?Past Medical History:  ?Diagnosis Date  ? Hypertension   ? states under control with med., has been on med. x 2 yr.  ? Trimalleolar fracture of ankle, closed 08/03/2016  ? right  ?  ?Past Surgical History:  ?Procedure Laterality Date  ? NO PAST SURGERIES    ? ORIF ANKLE FRACTURE Right 08/07/2016  ? Procedure: OPEN REDUCTION INTERNAL FIXATION (ORIF) RIGHT ANKLE FRACTURE;  Surgeon: Leandrew Koyanagi, MD;  Location: [facility];  Service: Orthopedics;  Laterality: Right;  ?  ?No family history on file.  ?Social History  ? ?Tobacco Use  ? Smoking status: Never  ? Smokeless tobacco: Never  ?Substance Use Topics  ? Alcohol use: No  ?  Alcohol/week: 0.0 standard drinks  ?  ?Subjective:  ? ?6 month follow up on chronic care needs; known history of white coat hypertension but does not check her blood pressure at home/ outside of office; Denies any chest pain, shortness of breath, blurred vision or headache ? ?Having increased pain in back of left knee x 1 week; no known injury or trauma to the knee;  ? ? ? ?Objective:  ?Vitals:  ? 09/27/21 0848 09/27/21 1006  ?BP: (!) 150/60 138/80  ?Pulse: 87   ?Temp: 98 ?F (36.7 ?C)   ?TempSrc: Oral   ?SpO2: 98%   ?Weight: 200 lb 3.2 oz (90.8 kg)   ?Height: _0  (1.626 m)   ?  ?General: Well developed, well nourished, in no acute distress  ?Skin : Warm and dry.   ?Head: Normocephalic and atraumatic  ?Lungs: Respirations unlabored; clear to auscultation bilaterally without wheeze, rales, rhonchi  ?CVS exam: normal rate and regular rhythm.  ?Musculoskeletal: No deformities; no active joint inflammation  ?Extremities: No edema, cyanosis, clubbing; extensive varicose veins noted behind right knee ?Vessels: Symmetric bilaterally  ?Neurologic: Alert and oriented; speech intact; face symmetrical; moves all extremities well; CNII-XII intact without focal deficit  ? ?Assessment:  ?1. Elevated glucose   ?2. Need for hepatitis C screening test   ?3. Varicose veins of both lower extremities with pain   ?4. Acute pain of left knee   ?5. Essential hypertension   ?  ?Plan:  ?Update Hgba1c today;  ?Order updated; ?Will refer to vascular specialist; no symptoms of DVT but may need to consider doppler based on left knee X-ray; ? Baker's Cyst;  ?Update left knee x-ray; ?Refill updated; patient is asked to start checking her blood pressure at home regularly and notify if consistently above 140/90;  ?Cologuard is re-ordered for patient; she will let us know if she does receive her kit in the next 2-3 weeks;  ? ?This visit occurred during the SARS-CoV-2 public health emergency.  Safety protocols were in place, including screening questions prior to the visit, additional usage of staff PPE, and extensive cleaning of exam room while observing appropriate contact time as indicated for  disinfecting solutions.  ? ? ?Return in about 4 months (around 01/27/2022) for CPE.  ?Orders Placed This Encounter  ?Procedures  ? DG Knee Complete 4 Views Left  ?  Standing Status:   Future  ?  Number of Occurrences:   1  ?  Standing Expiration Date:   09/28/2022  ?  Order Specific Question:   Reason for Exam (SYMPTOM  OR DIAGNOSIS REQUIRED)  ?  Answer:   left knee pain  ?  Order Specific Question:   Is patient pregnant?  ?  Answer:   No  ?  Order Specific Question:   Preferred imaging location?  ?  Answer:   [facility]  ? Comp Met (CMET)  ? Hemoglobin A1c  ? Hepatitis C Antibody  ? Ambulatory referral to Vascular Surgery  ?  Referral Priority:   Routine  ?  Referral Type:   Surgical  ?  Referral Reason:   Specialty Services Required  ?  Requested Specialty:   Vascular Surgery  ?  Number of Visits Requested:   1  ?  ?Requested Prescriptions  ? ?Signed Prescriptions Disp Refills  ? NIFEdipine (PROCARDIA XL/NIFEDICAL-XL) 90 MG 24 hr tablet 90 tablet 2  ?  Sig: Take 1 tablet (90 mg total) by mouth daily.  ?  ? ?

## 2021-09-28 LAB — HEPATITIS C ANTIBODY
Hepatitis C Ab: NONREACTIVE
SIGNAL TO CUT-OFF: 0.03 (ref <1.00)

## 2021-10-03 ENCOUNTER — Other Ambulatory Visit: Payer: Self-pay

## 2021-10-03 ENCOUNTER — Emergency Department (HOSPITAL_COMMUNITY)
Admission: EM | Admit: 2021-10-03 | Discharge: 2021-10-03 | Disposition: A | Discharge status: Home / Self Care | Payer: 59 | Attending: Emergency Medicine | Admitting: Emergency Medicine

## 2021-10-03 ENCOUNTER — Emergency Department (HOSPITAL_BASED_OUTPATIENT_CLINIC_OR_DEPARTMENT_OTHER)
Admit: 2021-10-03 | Discharge: 2021-10-03 | Disposition: A | Discharge status: Home / Self Care | Payer: 59 | Attending: Emergency Medicine | Admitting: Emergency Medicine

## 2021-10-03 ENCOUNTER — Encounter (HOSPITAL_COMMUNITY): Payer: Self-pay | Admitting: Emergency Medicine

## 2021-10-03 ENCOUNTER — Other Ambulatory Visit (HOSPITAL_COMMUNITY): Payer: Self-pay

## 2021-10-03 DIAGNOSIS — M79605 Pain in left leg: Secondary | ICD-10-CM | POA: Diagnosis not present

## 2021-10-03 DIAGNOSIS — M25562 Pain in left knee: Secondary | ICD-10-CM | POA: Diagnosis not present

## 2021-10-03 MED ORDER — OXYCODONE-ACETAMINOPHEN 5-325 MG PO TABS
1.0000 | ORAL_TABLET | Freq: Four times a day (QID) | ORAL | 0 refills | Status: DC | PRN
  Filled 2021-10-03: qty 15, 2d supply, fill #0

## 2021-10-03 NOTE — Progress Notes (Signed)
Left lower extremity venous duplex has been completed. ?Preliminary results can be found in CV Proc through chart review.  ?Results were given to Dr. Zenia Resides. ? ?10/03/21 9:55 AM ?Carlos Levering RVT   ?

## 2021-10-03 NOTE — Discharge Instructions (Signed)
Follow-up with your orthopedist this week ?

## 2021-10-03 NOTE — ED Provider Notes (Signed)
?[facility] DEPT ?Provider Note ? ? ?CSN: [id number] ?Arrival date & time: 10/03/21  0865 ? ?  ? ?History ? ?Chief Complaint  ?Patient presents with  ? Leg Pain  ? ? ?Colleen Potter is a 56 y.o. female. ? ?56 year old female presents with 1 week history of pain behind her left knee.  Was seen by a provider for this and had negative x-rays of that knee.  She was supposed to have an patient lower extremity Doppler which has not been performed.  She denies any distal numbness or tingling to her left foot.  No prior history of DVT.  Pain characterizes sharp and worse with trying to walk.  Responsive to home medications ? ? ?  ? ?Home Medications ?Prior to Admission medications   ?Medication Sig Start Date End Date Taking? Authorizing Provider  ?NIFEdipine (PROCARDIA XL/NIFEDICAL-XL) 90 MG 24 hr tablet Take 1 tablet (90 mg total) by mouth daily. 09/27/21   Marrian Salvage, [place]  ?   ? ?Allergies    ?Patient has no known allergies.   ? ?Review of Systems   ?Review of Systems  ?All other systems reviewed and are negative. ? ?Physical Exam ?Updated Vital Signs ?BP (!) 165/91   Pulse (!) 102   Temp 97.8 ?F (36.6 ?C) (Oral)   Resp 19   SpO2 99%  ?Physical Exam ?Vitals and nursing note reviewed.  ?Constitutional:   ?   General: She is not in acute distress. ?   Appearance: Normal appearance. She is well-developed. She is not toxic-appearing.  ?HENT:  ?   Head: Normocephalic and atraumatic.  ?Eyes:  ?   General: Lids are normal.  ?   Conjunctiva/sclera: Conjunctivae normal.  ?   Pupils: Pupils are equal, round, and reactive to light.  ?Neck:  ?   Thyroid: No thyroid mass.  ?   Trachea: No tracheal deviation.  ?Cardiovascular:  ?   Rate and Rhythm: Normal rate and regular rhythm.  ?   Heart sounds: Normal heart sounds. No murmur heard. ?  No gallop.  ?Pulmonary:  ?   Effort: Pulmonary effort is normal. No respiratory distress.  ?   Breath sounds: Normal breath sounds. No stridor. No decreased  breath sounds, wheezing, rhonchi or rales.  ?Abdominal:  ?   General: There is no distension.  ?   Palpations: Abdomen is soft.  ?   Tenderness: There is no abdominal tenderness. There is no rebound.  ?Musculoskeletal:     ?   General: No tenderness. Normal range of motion.  ?   Cervical back: Normal range of motion and neck supple.  ?     Legs: ? ?   Comments: Neurovascular intact at left foot.  Calf and thigh compartments soft  ?Skin: ?   General: Skin is warm and dry.  ?   Findings: No abrasion or rash.  ?Neurological:  ?   Mental Status: She is alert and oriented to person, place, and time. Mental status is at baseline.  ?   GCS: GCS eye subscore is 4. GCS verbal subscore is 5. GCS motor subscore is 6.  ?   Cranial Nerves: No cranial nerve deficit.  ?   Sensory: No sensory deficit.  ?   Motor: Motor function is intact.  ?Psychiatric:     ?   Attention and Perception: Attention normal.     ?   Speech: Speech normal.     ?   Behavior: Behavior normal.  ? ? ?  ED Results / Procedures / Treatments   ?Labs ?(all labs ordered are listed, but only abnormal results are displayed) ?Labs Reviewed - No data to display ? ?EKG ?None ? ?Radiology ?No results found. ? ?Procedures ?Procedures  ? ? ?Medications Ordered in ED ?Medications - No data to display ? ?ED Course/ Medical Decision Making/ A&P ?  ?                        ?Medical Decision Making ? ?Patient had old records reviewed and negative plain films of her left knee.  Concern for possible Baker's cyst and ultrasound negative here.  Suspect that she has some type of internal derangement of her knee.  No signs of vascular compromise.  Plan will be to place a knee immobilizer and have her see orthopedics.  Patient and husband agreeable to this ? ? ? ? ? ? ? ?Final Clinical Impression(s) / ED Diagnoses ?Final diagnoses:  ?None  ? ? ?Rx / DC Orders ?ED Discharge Orders   ? ? None  ? ?  ? ? ?  ?Lacretia Leigh, MD ?10/03/21 1030 ? ?

## 2021-10-03 NOTE — ED Triage Notes (Signed)
Pt reports having varicose veins in her legs and states that in her left calf one of her varicose veins "popped" and now she is unable to bear weight on leg.  ?

## 2021-10-05 ENCOUNTER — Telehealth: Payer: Self-pay | Admitting: Physician Assistant

## 2021-10-05 ENCOUNTER — Other Ambulatory Visit: Payer: Self-pay

## 2021-10-05 ENCOUNTER — Other Ambulatory Visit (HOSPITAL_COMMUNITY): Payer: Self-pay

## 2021-10-05 ENCOUNTER — Encounter: Payer: Self-pay | Admitting: Physician Assistant

## 2021-10-05 ENCOUNTER — Other Ambulatory Visit: Payer: Self-pay | Admitting: Orthopaedic Surgery

## 2021-10-05 ENCOUNTER — Ambulatory Visit (INDEPENDENT_AMBULATORY_CARE_PROVIDER_SITE_OTHER): Payer: 59 | Admitting: Physician Assistant

## 2021-10-05 DIAGNOSIS — M25562 Pain in left knee: Secondary | ICD-10-CM

## 2021-10-05 MED ORDER — TRAMADOL HCL 50 MG PO TABS
50.0000 mg | ORAL_TABLET | Freq: Two times a day (BID) | ORAL | 0 refills | Status: DC | PRN
  Filled 2021-10-05: qty 30, 15d supply, fill #0

## 2021-10-05 MED ORDER — DIAZEPAM 5 MG PO TABS
5.0000 mg | ORAL_TABLET | Freq: Once | ORAL | 0 refills | Status: AC
  Filled 2021-10-05: qty 1, 1d supply, fill #0

## 2021-10-05 NOTE — Progress Notes (Addendum)
? ?Office Visit Note ?  ?Patient: Colleen Potter           ?Date of Birth: 07-02-1966           ?MRN: [id number] ?Visit Date: 10/05/2021 ?             ?Requested by: Marrian Salvage, [address] ?[address] ?[place],   [address] ?PCP: Marrian Salvage, [place] ? ? ?Assessment & Plan: ?Visit Diagnoses:  ?1. Acute pain of left knee   ? ? ?Plan: Impression is acute left knee pain concerning for meniscal pathology.  I would like to go ahead and order an MRI to assess for structural abnormalities.  She will follow-up with [place] once this is been completed.  In the meantime, she will continue to ice, rest and elevate.  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  Call with concerns or questions. ? ?Follow-Up Instructions: Return for after MRI.  ? ?Orders:  ?No orders of the defined types were placed in this encounter. ? ?No orders of the defined types were placed in this encounter. ? ? ? ? Procedures: ?No procedures performed ? ? ?Clinical Data: ?No additional findings. ? ? ?Subjective: ?Chief Complaint  ?Patient presents with  ? Left Knee - Pain  ? ? 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  She was walking up a set of stairs on 09/27/2020 when also and she felt a pop to the posterior lateral knee.  She was unable to bear weight following the pop.  She was seen in the ED where x-rays were negative.  She underwent left lower extremity Doppler which was negative for DVT or Baker's cyst.  She notes her symptoms have slightly improved.  She continues to have pain primarily to the posterior lateral knee.  Pain is worse with bearing weight knee flexion.  She denies any locking or catching just feels a tight sensation to the back of the knee.  She has taken 1 Percocet which did seem to help. ? ?Review of Systems as detailed in HPI.  All others reviewed  and are negative. ? ? ?Objective: ?Vital Signs: There were no vitals taken for this visit. ? ?Physical Exam well-developed well-nourished female no acute distress.  Alert and oriented x3. ? ?Ortho Exam left knee exam shows a small effusion.  She does have moderate tenderness to posterior lateral joint line.  Mild tenderness to the medial joint line.  she has mild tenderness to the popliteal fossa.  Lateral pain with varus stress but no laxity.  No pain or laxity with valgus stress.  Negative anterior drawer.  No pain with resisted knee flexion or extension.  She is able to fully straight leg raise.  She is neurovascular intact distally. ? ?Specialty Comments:  ?No specialty comments available. ? ?Imaging: ?No new imaging ? ? ?PMFS History: ?Patient Active Problem List  ? Diagnosis Date Noted  ? Chronic venous insufficiency 05/12/2018  ? Displaced trimalleolar fracture of right lower leg, initial encounter for closed fracture 08/06/2016  ? Essential hypertension 10/16/2015  ? ?Past Medical History:  ?Diagnosis Date  ? Hypertension   ? states under control with med., has been on med. x 2 yr.  ? Trimalleolar fracture of ankle, closed 08/03/2016  ? right  ?  ?History reviewed.  No pertinent family history.  ?Past Surgical History:  ?Procedure Laterality Date  ? NO PAST SURGERIES    ? ORIF ANKLE FRACTURE Right 08/07/2016  ? Procedure: OPEN REDUCTION INTERNAL FIXATION (ORIF) RIGHT ANKLE FRACTURE;  Surgeon: Leandrew Koyanagi, MD;  Location: [facility];  Service: Orthopedics;  Laterality: Right;  ? ?Social History  ? ?Occupational History  ? Occupation: EDS  ?Tobacco Use  ? Smoking status: Never  ? Smokeless tobacco: Never  ?Substance and Sexual Activity  ? Alcohol use: No  ?  Alcohol/week: 0.0 standard drinks  ? Drug use: No  ? Sexual activity: Not on file  ? ? ? ? ? ? ?

## 2021-10-05 NOTE — Telephone Encounter (Signed)
Patient called needing some medication called in before she have the MRI on Sunday. The number to contact patient is [phone] ?

## 2021-10-05 NOTE — Addendum Note (Signed)
Addended by: Precious Bard on: 10/05/2021 08:58 AM ? ? Modules accepted: Orders ? ?

## 2021-10-05 NOTE — Telephone Encounter (Signed)
Called and let patient know that medication was sent in. ?

## 2021-10-07 ENCOUNTER — Other Ambulatory Visit: Payer: Self-pay

## 2021-10-07 ENCOUNTER — Ambulatory Visit
Admission: RE | Admit: 2021-10-07 | Discharge: 2021-10-07 | Disposition: A | Discharge status: Home / Self Care | Payer: 59 | Source: Ambulatory Visit | Attending: Physician Assistant | Admitting: Physician Assistant

## 2021-10-07 DIAGNOSIS — M25562 Pain in left knee: Secondary | ICD-10-CM

## 2021-10-07 DIAGNOSIS — S83242A Other tear of medial meniscus, current injury, left knee, initial encounter: Secondary | ICD-10-CM | POA: Diagnosis not present

## 2021-10-08 NOTE — Progress Notes (Signed)
F/u to discuss

## 2021-10-16 ENCOUNTER — Ambulatory Visit (INDEPENDENT_AMBULATORY_CARE_PROVIDER_SITE_OTHER): Payer: 59 | Admitting: Orthopaedic Surgery

## 2021-10-16 ENCOUNTER — Encounter: Payer: Self-pay | Admitting: Orthopaedic Surgery

## 2021-10-16 DIAGNOSIS — M25562 Pain in left knee: Secondary | ICD-10-CM

## 2021-10-16 NOTE — Progress Notes (Signed)
? ?  Office Visit Note ?  ?Patient: Colleen Potter           ?Date of Birth: Mar 20, 1966           ?MRN: [id number] ?Visit Date: 10/16/2021 ?             ?Requested by: Marrian Salvage, [address] ?[address] ?[place],  [place] [address] ?PCP: Marrian Salvage, [place] ? ? ?Assessment & Plan: ?Visit Diagnoses:  ?1. Acute pain of left knee   ? ? ?Plan: Patient returns today to discuss left knee MRI results. ? ?Examination of left knee is unchanged. ? ?MRI of the left knee shows tear of the posterior root of the medial meniscus with degeneration of the meniscus overall.  There is extrusion of the medial meniscus.  Mild chondromalacia.  No acute abnormalities otherwise.  No subchondral edema.  Given these findings and the fact that her symptoms are not severe or constant we will try to manage this conservatively.  We will provide her with an OA reaction brace to wear while she is at work.  I would like to recheck her symptoms in 6 weeks. ? ?Follow-Up Instructions: Return in about 6 weeks (around 11/27/2021).  ? ?Orders:  ?No orders of the defined types were placed in this encounter. ? ?No orders of the defined types were placed in this encounter. ? ? ? ? Procedures: ?No procedures performed ? ? ?Clinical Data: ?No additional findings. ? ? ?Subjective: ?Chief Complaint  ?Patient presents with  ? Left Knee - Follow-up  ?  MRI review  ? ? ?HPI ? ?Review of Systems ? ? ?Objective: ?Vital Signs: There were no vitals taken for this visit. ? ?Physical Exam ? ?Ortho Exam ? ?Specialty Comments:  ?No specialty comments available. ? ?Imaging: ?No results found. ? ? ?PMFS History: ?Patient Active Problem List  ? Diagnosis Date Noted  ? Chronic venous insufficiency 05/12/2018  ? Displaced trimalleolar fracture of right lower leg, initial encounter for closed fracture 08/06/2016  ? Essential hypertension 10/16/2015  ? ?Past Medical History:  ?Diagnosis Date  ? Hypertension   ? states under control with med., has been  on med. x 2 yr.  ? Trimalleolar fracture of ankle, closed 08/03/2016  ? right  ?  ?No family history on file.  ?Past Surgical History:  ?Procedure Laterality Date  ? NO PAST SURGERIES    ? ORIF ANKLE FRACTURE Right 08/07/2016  ? Procedure: OPEN REDUCTION INTERNAL FIXATION (ORIF) RIGHT ANKLE FRACTURE;  Surgeon: Leandrew Koyanagi, MD;  Location: [facility];  Service: Orthopedics;  Laterality: Right;  ? ?Social History  ? ?Occupational History  ? Occupation: E[employer]  ?Tobacco Use  ? Smoking status: Never  ? Smokeless tobacco: Never  ?Substance and Sexual Activity  ? Alcohol use: No  ?  Alcohol/week: 0.0 standard drinks  ? Drug use: No  ? Sexual activity: Not on file  ? ? ? ? ? ? ?

## 2021-10-22 ENCOUNTER — Ambulatory Visit: Payer: 59

## 2021-10-22 DIAGNOSIS — M25562 Pain in left knee: Secondary | ICD-10-CM

## 2021-10-22 DIAGNOSIS — M25572 Pain in left ankle and joints of left foot: Secondary | ICD-10-CM | POA: Diagnosis not present

## 2021-10-22 NOTE — Progress Notes (Signed)
Patient came in for a knee brace.  ? ?

## 2021-10-26 ENCOUNTER — Ambulatory Visit: Payer: 59

## 2021-11-19 ENCOUNTER — Other Ambulatory Visit (HOSPITAL_COMMUNITY): Payer: Self-pay

## 2021-11-29 ENCOUNTER — Ambulatory Visit: Payer: 59 | Admitting: Orthopaedic Surgery

## 2021-12-04 ENCOUNTER — Ambulatory Visit (INDEPENDENT_AMBULATORY_CARE_PROVIDER_SITE_OTHER): Payer: 59 | Admitting: Orthopaedic Surgery

## 2021-12-04 ENCOUNTER — Encounter: Payer: Self-pay | Admitting: Orthopaedic Surgery

## 2021-12-04 DIAGNOSIS — S83242A Other tear of medial meniscus, current injury, left knee, initial encounter: Secondary | ICD-10-CM

## 2021-12-04 NOTE — Progress Notes (Signed)
   Office Visit Note   Patient: Colleen Potter           Date of Birth: 1966-05-07           MRN: [id number] Visit Date: 12/04/2021              Requested by: Marrian Salvage, [address] [place],   [id number] PCP: Marrian Salvage, FNP   Assessment & Plan: Visit Diagnoses:  1. Acute medial meniscus tear of left knee, initial encounter     Plan: Impression is symptomatic medial meniscus tear.  Unfortunately she continues to be symptomatic despite conservative management.  Based on her options she would like to move forward with arthroscopic medial meniscal debridement sometime in July.  Jackelyn Poling will call her to schedule surgery.  Follow-Up Instructions: No follow-ups on file.   Orders:  No orders of the defined types were placed in this encounter.  No orders of the defined types were placed in this encounter.     Procedures: No procedures performed   Clinical Data: No additional findings.   Subjective: Chief Complaint  Patient presents with   Left Knee - Pain    HPI Patient returns today to follow-up for left knee pain.  We saw her about 7 weeks ago to discuss MRI which showed medial meniscus tear.  She has tried to manage this conservatively with an OA reaction brace.  Unfortunately she has not felt any significant improvement from this.  Continues to have pain with standing activity and at work. Review of Systems   Objective: Vital Signs: There were no vitals taken for this visit.  Physical Exam  Ortho Exam Examination left knee shows medial joint line tenderness and pain with McMurray's test. Specialty Comments:  No specialty comments available.  Imaging: No results found.   PMFS History: Patient Active Problem List   Diagnosis Date Noted   Acute medial meniscus tear of left knee 12/04/2021   Chronic venous insufficiency 05/12/2018   Displaced trimalleolar fracture of right lower leg, initial encounter for  closed fracture 08/06/2016   Essential hypertension 10/16/2015   Past Medical History:  Diagnosis Date   Hypertension    states under control with med., has been on med. x 2 yr.   Trimalleolar fracture of ankle, closed 08/03/2016   right    History reviewed. No pertinent family history.  Past Surgical History:  Procedure Laterality Date   NO PAST SURGERIES     ORIF ANKLE FRACTURE Right 08/07/2016   Procedure: OPEN REDUCTION INTERNAL FIXATION (ORIF) RIGHT ANKLE FRACTURE;  Surgeon: Leandrew Koyanagi, MD;  Location: [facility];  Service: Orthopedics;  Laterality: Right;   Social History   Occupational History   Occupation: EDS  Tobacco Use   Smoking status: Never   Smokeless tobacco: Never  Substance and Sexual Activity   Alcohol use: No    Alcohol/week: 0.0 standard drinks   Drug use: No   Sexual activity: Not on file

## 2022-01-09 DIAGNOSIS — H524 Presbyopia: Secondary | ICD-10-CM | POA: Diagnosis not present

## 2022-01-09 DIAGNOSIS — H5203 Hypermetropia, bilateral: Secondary | ICD-10-CM | POA: Diagnosis not present

## 2022-01-09 DIAGNOSIS — H52223 Regular astigmatism, bilateral: Secondary | ICD-10-CM | POA: Diagnosis not present

## 2022-01-29 ENCOUNTER — Telehealth: Payer: Self-pay | Admitting: Orthopaedic Surgery

## 2022-01-29 NOTE — Telephone Encounter (Addendum)
Left message on patient's voicemail to call me directly to discuss surgery dates fin July with Dr. Erlinda Hong for her procedure (left knee partial medial meniscectomy)

## 2022-02-12 ENCOUNTER — Encounter: Payer: 59 | Admitting: Family

## 2022-02-25 ENCOUNTER — Other Ambulatory Visit (HOSPITAL_COMMUNITY): Payer: Self-pay

## 2022-04-04 ENCOUNTER — Other Ambulatory Visit: Payer: Self-pay | Admitting: Family

## 2022-04-04 DIAGNOSIS — Z1231 Encounter for screening mammogram for malignant neoplasm of breast: Secondary | ICD-10-CM

## 2022-04-18 IMAGING — MG MM DIGITAL SCREENING BILAT W/ TOMO AND CAD
8 series · 9 of 24 positions shown · non-contrast
Comparison: Previous exam(s).

CLINICAL DATA: Screening.

EXAM:
DIGITAL SCREENING BILATERAL MAMMOGRAM WITH TOMOSYNTHESIS AND CAD
TECHNIQUE: Bilateral screening digital craniocaudal and mediolateral oblique
mammograms were obtained. Bilateral screening digital breast
tomosynthesis was performed. The images were evaluated with
computer-aided detection.

[L CC synth-2D]
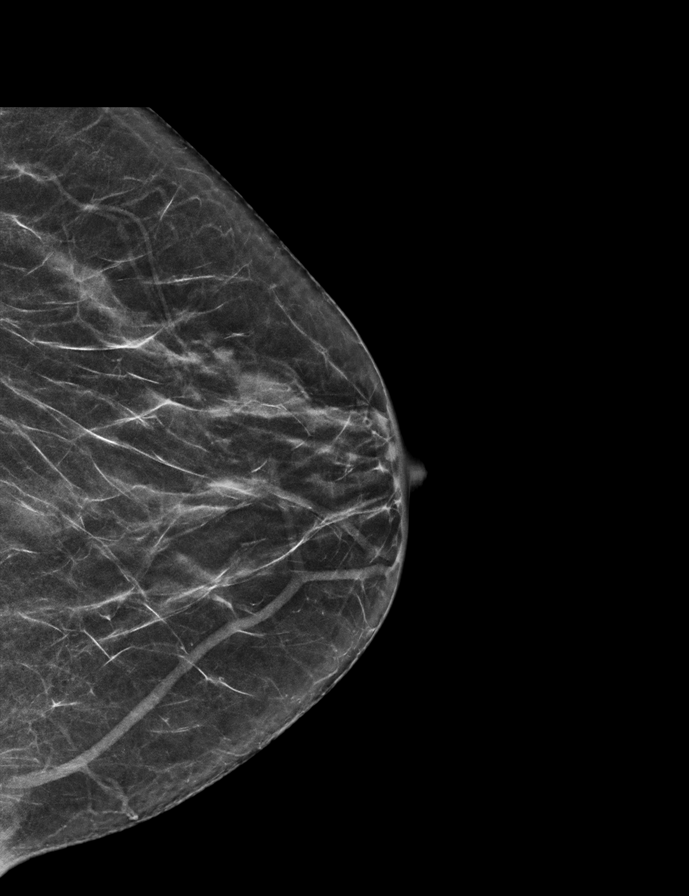

[L MLO synth-2D]
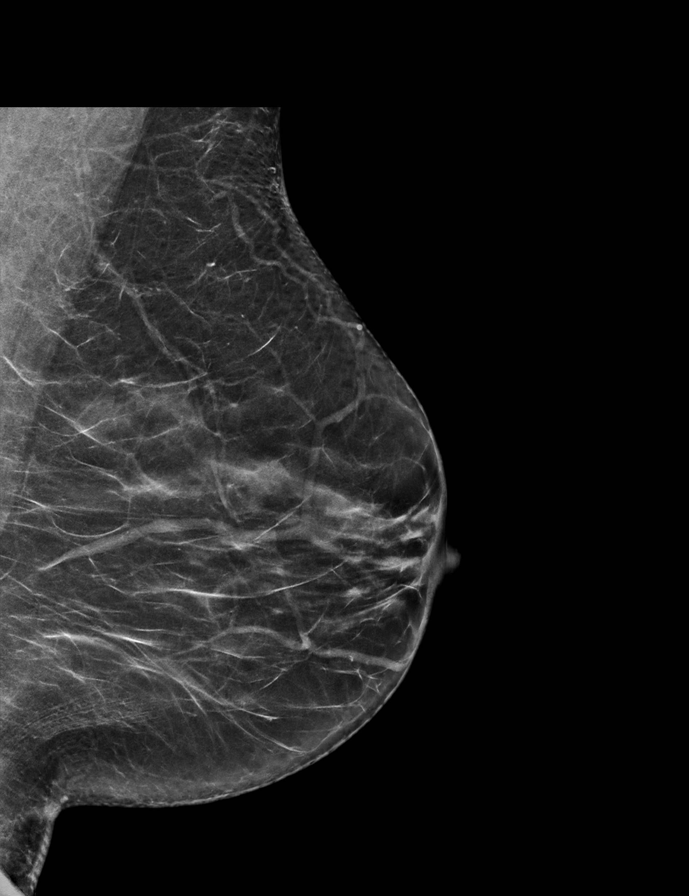

[R MLO synth-2D]
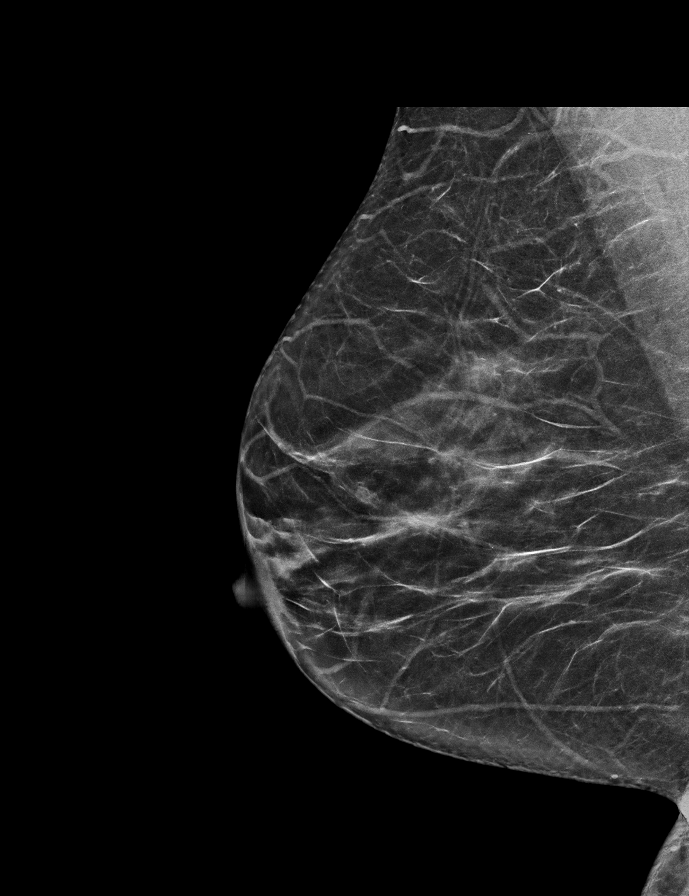

[R CC synth-2D]
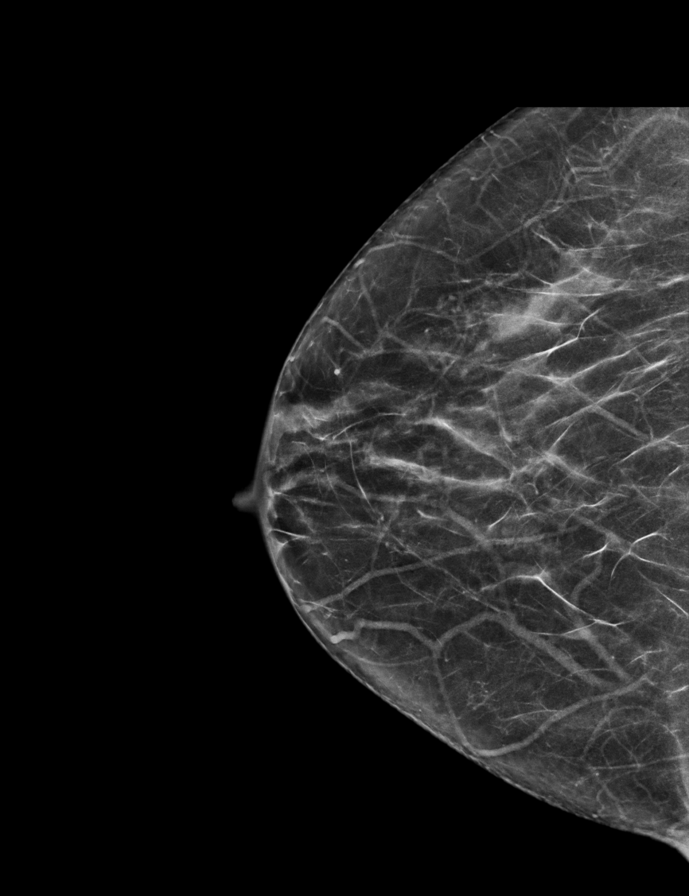

[R CC tomo · 2 of 62 frames shown]
[frame 21/62]
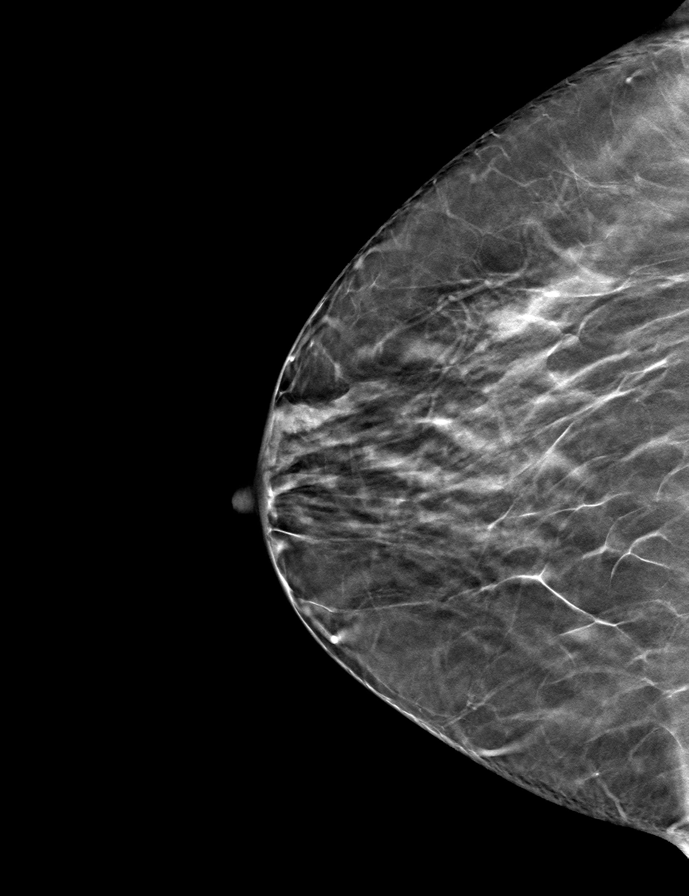
[frame 31/62]
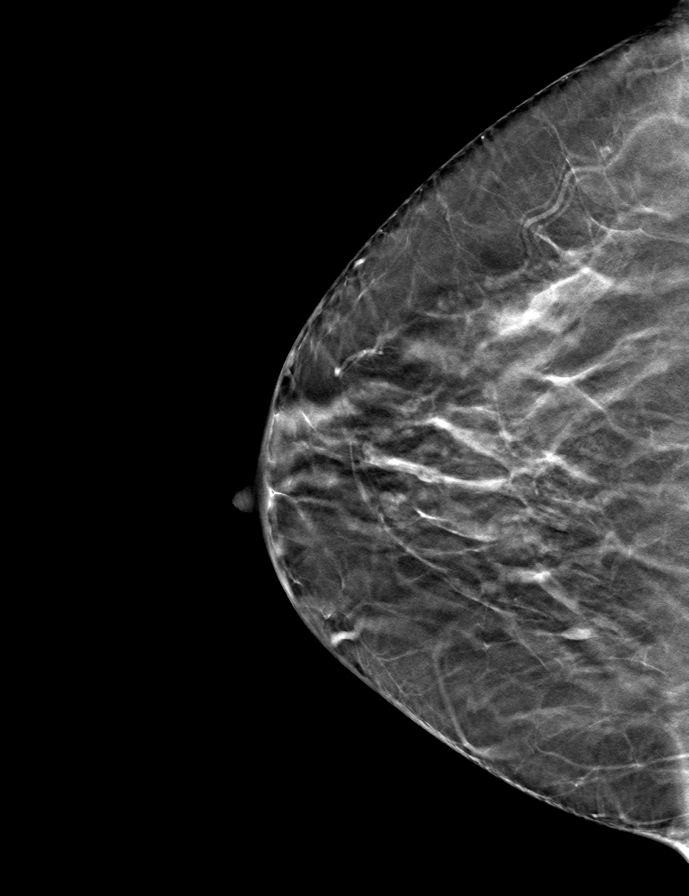

[L MLO tomo · tomo slice 31/62.0]
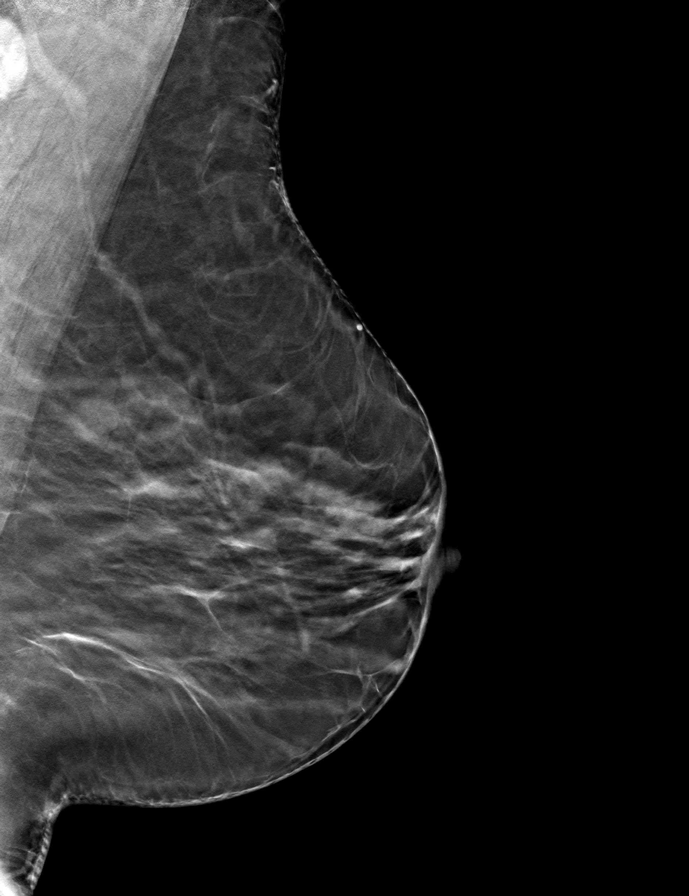

[L CC tomo · tomo slice 27/54.0]
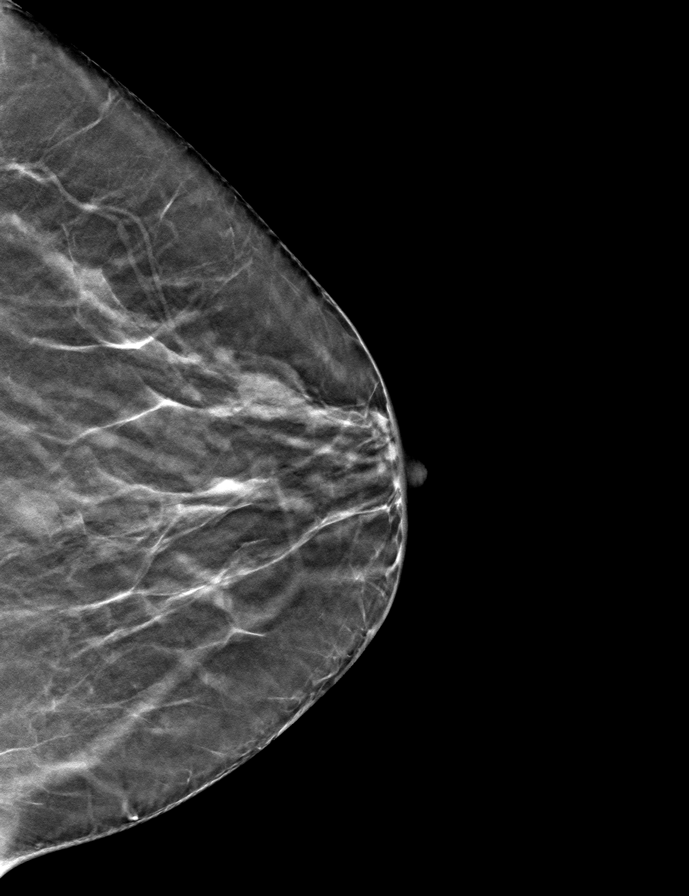

[R MLO tomo · tomo slice 33/64.0]
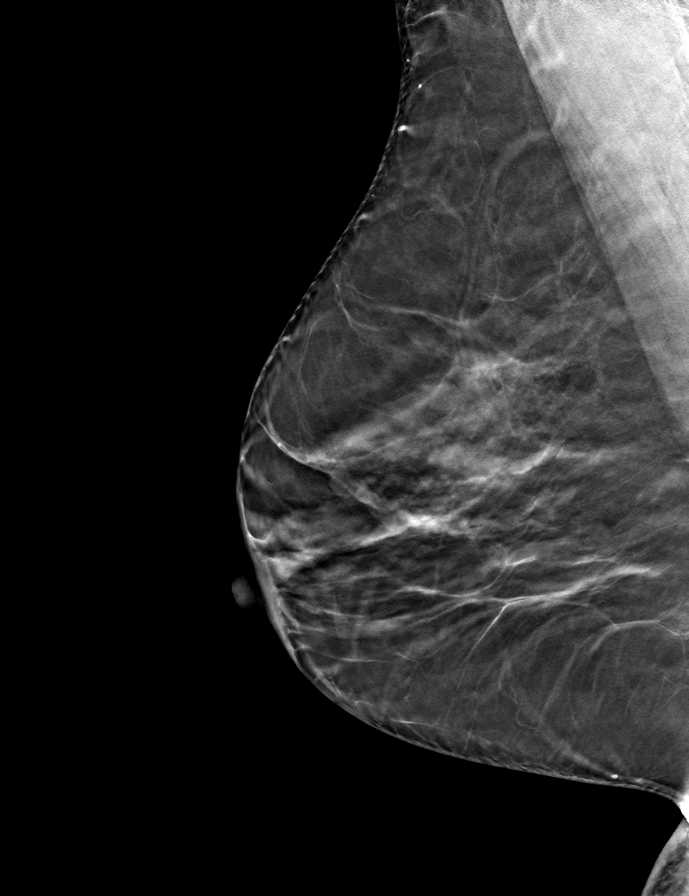

[9 of 24 positions shown; findings below may reference images not displayed]

ACR Breast Density Category b: There are scattered areas of
fibroglandular density.
FINDINGS: There are no findings suspicious for malignancy.
IMPRESSION: No mammographic evidence of malignancy. A result letter of this
screening mammogram will be mailed directly to the patient.

RECOMMENDATION:
Screening mammogram in one year. (Code:51-O-LD2)

BI-RADS CATEGORY  1: Negative.

## 2022-04-29 ENCOUNTER — Telehealth: Payer: Self-pay | Admitting: Family

## 2022-04-29 DIAGNOSIS — I10 Essential (primary) hypertension: Secondary | ICD-10-CM

## 2022-04-29 NOTE — Telephone Encounter (Signed)
Mickel Baas do you know of any labs pt can have before coming into the office for a CPE?

## 2022-04-29 NOTE — Telephone Encounter (Signed)
Pt is wanting to know when she is due for any lab work/appts to have her meds refilled as her CPE wasn't able to be scheduled until 12.7.23. Please Advise

## 2022-04-30 ENCOUNTER — Other Ambulatory Visit (HOSPITAL_COMMUNITY): Payer: Self-pay

## 2022-04-30 MED ORDER — NIFEDIPINE ER OSMOTIC RELEASE 90 MG PO TB24
90.0000 mg | ORAL_TABLET | Freq: Every day | ORAL | 0 refills | Status: DC
  Filled 2022-04-30 – 2022-05-23 (×3): qty 90, 90d supply, fill #0

## 2022-04-30 NOTE — Telephone Encounter (Signed)
I have called pt back and she stated that she does have a CPE coming up and just needed her meds refilled. I have sent it in for 90 days and she is in agreement about getting lab work at the next [place].   FYI to provider

## 2022-05-10 ENCOUNTER — Other Ambulatory Visit (HOSPITAL_COMMUNITY): Payer: Self-pay

## 2022-05-20 ENCOUNTER — Other Ambulatory Visit (HOSPITAL_COMMUNITY): Payer: Self-pay

## 2022-05-23 ENCOUNTER — Other Ambulatory Visit (HOSPITAL_COMMUNITY): Payer: Self-pay

## 2022-06-14 IMAGING — DX DG KNEE COMPLETE 4+V*L*
4 series · 4 of 4 positions shown · non-contrast
Comparison: None.

CLINICAL DATA: Left knee pain in back of knee.

EXAM:
LEFT KNEE - COMPLETE 4+ VIEW

[knee ap]
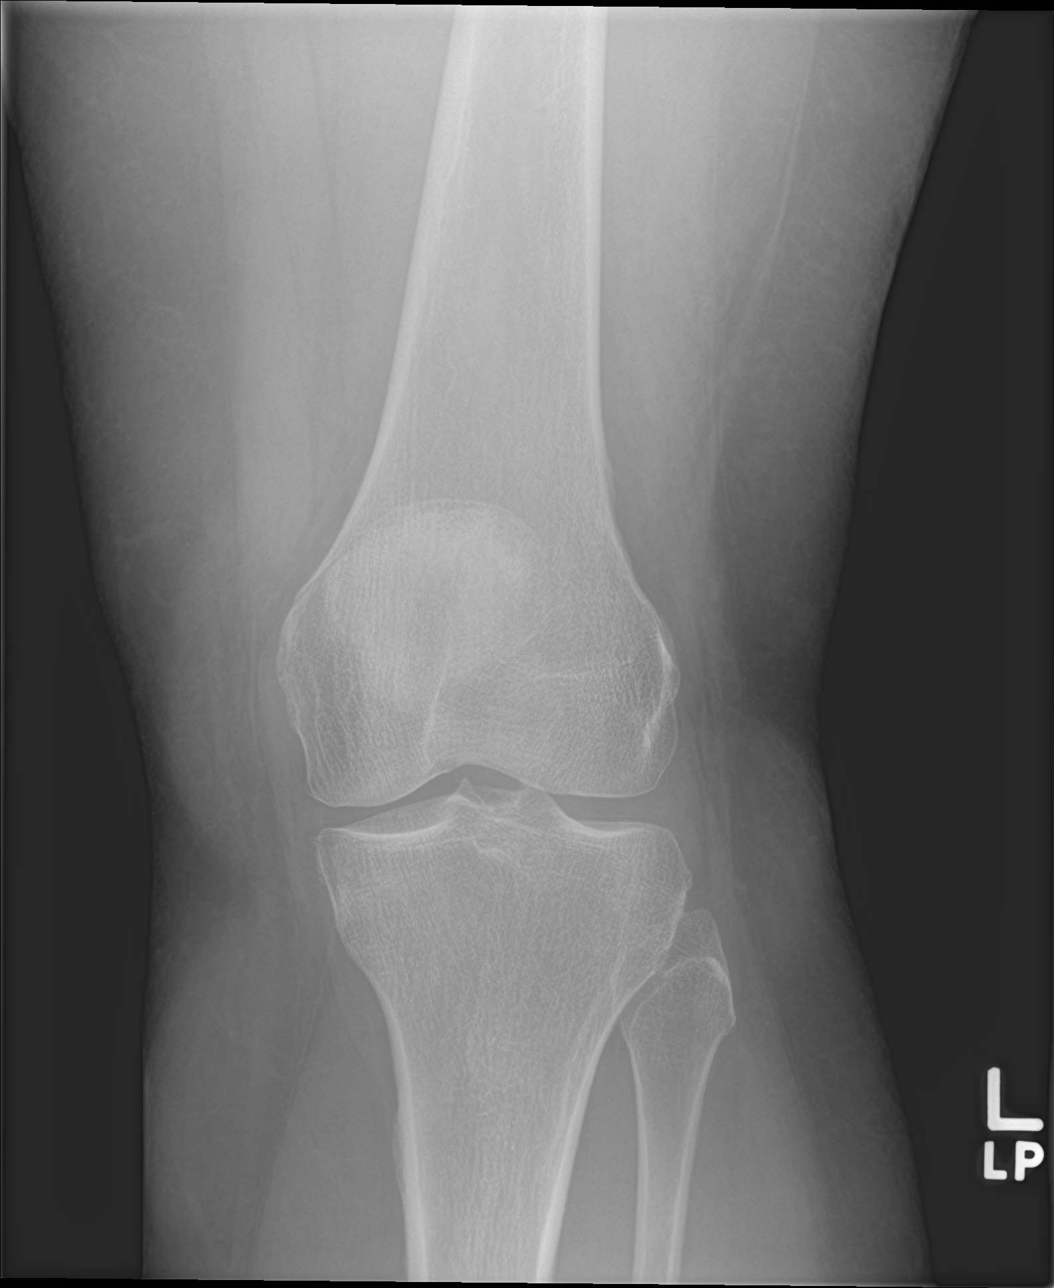

[knee lat]
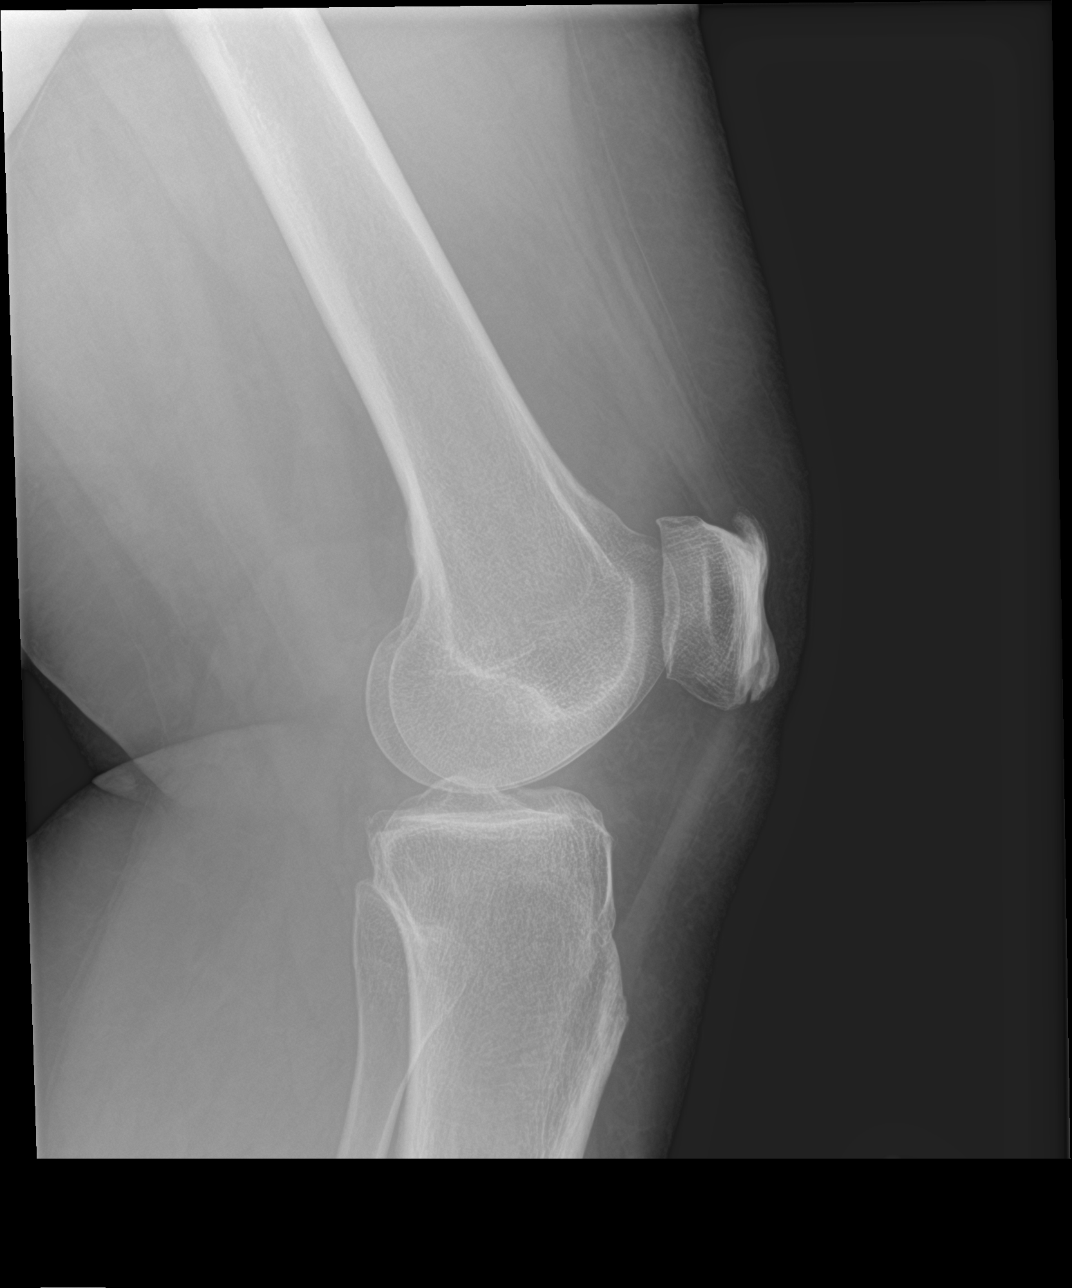

[knee obl (1 of 2)]
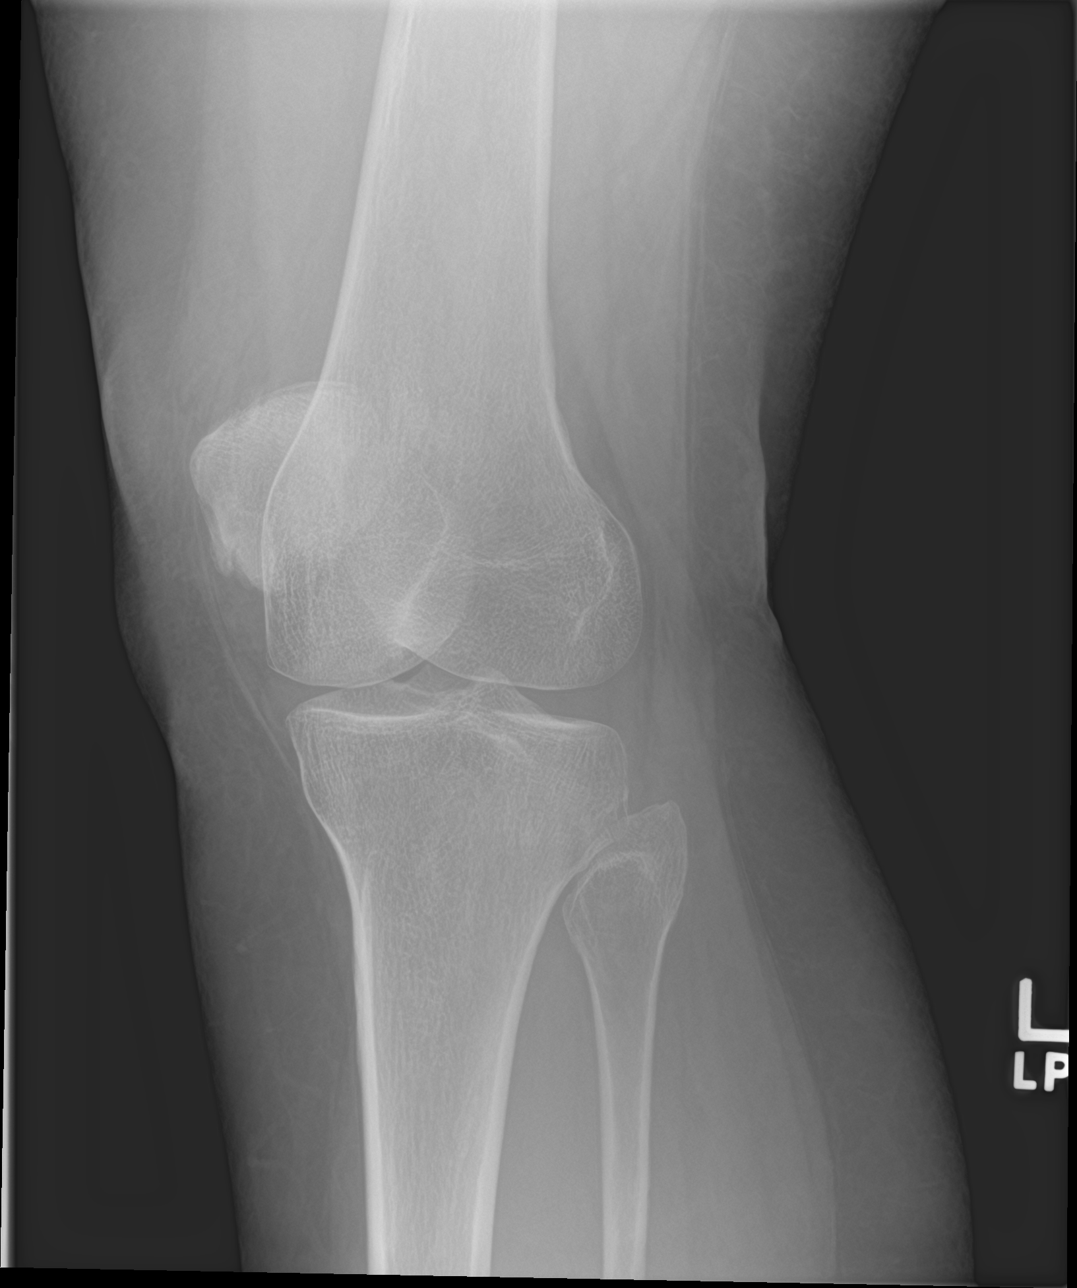

[knee obl (2 of 2)]
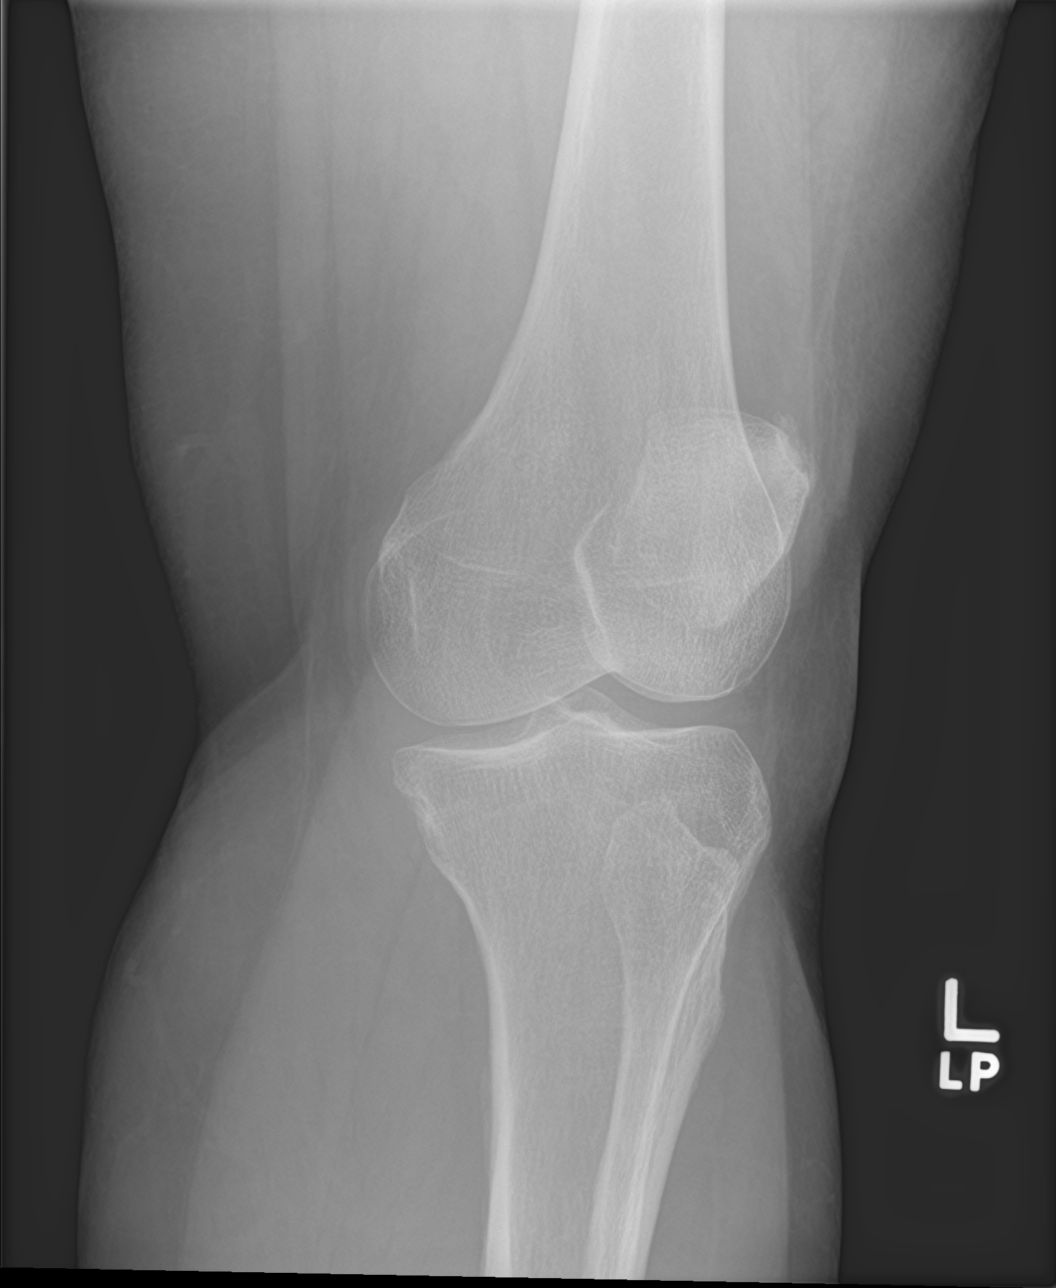

[4 of 4 positions shown; findings below may reference images not displayed]

FINDINGS: Normal anatomic alignment. No evidence for acute fracture or
dislocation. Regional soft tissues are unremarkable. Enthesopathic
changes of the patella.
IMPRESSION: No acute osseous abnormality.

## 2022-06-20 ENCOUNTER — Other Ambulatory Visit (HOSPITAL_COMMUNITY)
Admission: RE | Admit: 2022-06-20 | Discharge: 2022-06-20 | Disposition: A | Discharge status: Home / Self Care | Payer: 59 | Source: Ambulatory Visit | Attending: Family | Admitting: Family

## 2022-06-20 ENCOUNTER — Encounter: Payer: Self-pay | Admitting: Family

## 2022-06-20 ENCOUNTER — Ambulatory Visit (INDEPENDENT_AMBULATORY_CARE_PROVIDER_SITE_OTHER): Payer: 59 | Admitting: Family

## 2022-06-20 ENCOUNTER — Other Ambulatory Visit (HOSPITAL_COMMUNITY): Payer: Self-pay

## 2022-06-20 VITALS — BP 138/78 | HR 95 | Temp 97.7°F | Resp 18 | Ht 64.0 in | Wt 193.4 lb

## 2022-06-20 DIAGNOSIS — Z0000 Encounter for general adult medical examination without abnormal findings: Secondary | ICD-10-CM | POA: Diagnosis not present

## 2022-06-20 DIAGNOSIS — I10 Essential (primary) hypertension: Secondary | ICD-10-CM | POA: Diagnosis not present

## 2022-06-20 DIAGNOSIS — N926 Irregular menstruation, unspecified: Secondary | ICD-10-CM | POA: Diagnosis not present

## 2022-06-20 DIAGNOSIS — H6121 Impacted cerumen, right ear: Secondary | ICD-10-CM

## 2022-06-20 DIAGNOSIS — R7309 Other abnormal glucose: Secondary | ICD-10-CM | POA: Diagnosis not present

## 2022-06-20 DIAGNOSIS — L989 Disorder of the skin and subcutaneous tissue, unspecified: Secondary | ICD-10-CM

## 2022-06-20 DIAGNOSIS — Z1211 Encounter for screening for malignant neoplasm of colon: Secondary | ICD-10-CM

## 2022-06-20 DIAGNOSIS — Z124 Encounter for screening for malignant neoplasm of cervix: Secondary | ICD-10-CM | POA: Diagnosis not present

## 2022-06-20 DIAGNOSIS — Z13220 Encounter for screening for lipoid disorders: Secondary | ICD-10-CM

## 2022-06-20 LAB — CBC WITH DIFFERENTIAL/PLATELET
Basophils Absolute: 0 10*3/uL (ref 0.0–0.1)
Basophils Relative: 0.4 % (ref 0.0–3.0)
Eosinophils Absolute: 0.2 10*3/uL (ref 0.0–0.7)
Eosinophils Relative: 3.2 % (ref 0.0–5.0)
HCT: 34.6 % — ABNORMAL LOW (ref 36.0–46.0)
Hemoglobin: 11.7 g/dL — ABNORMAL LOW (ref 12.0–15.0)
Lymphocytes Relative: 44.3 % (ref 12.0–46.0)
Lymphs Abs: 2.4 10*3/uL (ref 0.7–4.0)
MCHC: 33.8 g/dL (ref 30.0–36.0)
MCV: 87.3 fl (ref 78.0–100.0)
Monocytes Absolute: 0.3 10*3/uL (ref 0.1–1.0)
Monocytes Relative: 5.8 % (ref 3.0–12.0)
Neutro Abs: 2.5 10*3/uL (ref 1.4–7.7)
Neutrophils Relative %: 46.3 % (ref 43.0–77.0)
Platelets: 448 10*3/uL — ABNORMAL HIGH (ref 150.0–400.0)
RBC: 3.96 Mil/uL (ref 3.87–5.11)
RDW: 13.5 % (ref 11.5–15.5)
WBC: 5.5 10*3/uL (ref 4.0–10.5)

## 2022-06-20 LAB — COMPREHENSIVE METABOLIC PANEL
ALT: 8 U/L (ref 0–35)
AST: 9 U/L (ref 0–37)
Albumin: 4.4 g/dL (ref 3.5–5.2)
Alkaline Phosphatase: 56 U/L (ref 39–117)
BUN: 15 mg/dL (ref 6–23)
CO2: 27 mEq/L (ref 19–32)
Calcium: 9.3 mg/dL (ref 8.4–10.5)
Chloride: 105 mEq/L (ref 96–112)
Creatinine, Ser: 1.04 mg/dL (ref 0.40–1.20)
GFR: 60.26 mL/min (ref >60.00)
Glucose, Bld: 105 mg/dL — ABNORMAL HIGH (ref 70–99)
Potassium: 3.6 mEq/L (ref 3.5–5.1)
Sodium: 140 mEq/L (ref 135–145)
Total Bilirubin: 0.3 mg/dL (ref 0.2–1.2)
Total Protein: 7.8 g/dL (ref 6.0–8.3)

## 2022-06-20 LAB — LIPID PANEL
Cholesterol: 182 mg/dL (ref 0–200)
HDL: 44.2 mg/dL (ref >39.00)
LDL Cholesterol: 118 mg/dL — ABNORMAL HIGH (ref 0–99)
NonHDL: 138.04
Total CHOL/HDL Ratio: 4
Triglycerides: 99 mg/dL (ref 0.0–149.0)
VLDL: 19.8 mg/dL (ref 0.0–40.0)

## 2022-06-20 LAB — HEMOGLOBIN A1C: Hgb A1c MFr Bld: 6.4 % (ref 4.6–6.5)

## 2022-06-20 MED ORDER — NIFEDIPINE ER OSMOTIC RELEASE 90 MG PO TB24
90.0000 mg | ORAL_TABLET | Freq: Every day | ORAL | 3 refills | Status: DC
  Filled 2022-06-20 – 2022-08-29 (×2): qty 90, 90d supply, fill #0
  Filled 2022-12-05: qty 90, 90d supply, fill #1
  Filled 2023-03-03: qty 90, 90d supply, fill #2
  Filled 2023-05-23: qty 90, 90d supply, fill #3

## 2022-06-20 NOTE — Progress Notes (Signed)
Colleen Potter is a 56 y.o. female with the following history as recorded in Epic[employer]:  Patient Active Problem List   Diagnosis Date Noted   Acute medial meniscus tear of left knee 12/04/2021   Chronic venous insufficiency 05/12/2018   Displaced trimalleolar fracture of right lower leg, initial encounter for closed fracture 08/06/2016   Essential hypertension 10/16/2015    Current Outpatient Medications  Medication Sig Dispense Refill   NIFEdipine (PROCARDIA XL/NIFEDICAL-XL) 90 MG 24 hr tablet Take 1 tablet (90 mg total) by mouth daily. 90 tablet 3   No current facility-administered medications for this visit.    Allergies: Patient has no known allergies.  Past Medical History:  Diagnosis Date   Hypertension    states under control with med., has been on med. x 2 yr.   Trimalleolar fracture of ankle, closed 08/03/2016   right    Past Surgical History:  Procedure Laterality Date   NO PAST SURGERIES     ORIF ANKLE FRACTURE Right 08/07/2016   Procedure: OPEN REDUCTION INTERNAL FIXATION (ORIF) RIGHT ANKLE FRACTURE;  Surgeon: Leandrew Koyanagi, MD;  Location: [facility];  Service: Orthopedics;  Laterality: Right;    No family history on file.  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   HENT: Negative.    Eyes: Negative.   Respiratory: Negative.    Cardiovascular: Negative.   Gastrointestinal: Negative.   Genitourinary: Negative.   Musculoskeletal: Negative.   Skin: Negative.   Neurological: Negative.   Endo/Heme/Allergies: Negative.   Psychiatric/Behavioral: Negative.          Objective:  Vitals:   06/20/22 0846  BP: 138/78  Pulse: 95  Resp: 18  Temp: 97.7 F (36.5 C)  TempSrc: Oral  SpO2: 100%  Weight: 193 lb 6.4 oz (87.7 kg)  Height: _0  (1.626 m)    General: Well developed, well nourished, in no acute distress  Skin : Warm and dry. Large skin tag noted at base of spine Head: Normocephalic and atraumatic  Eyes: Sclera and conjunctiva clear; pupils round and reactive to light; extraocular movements intact  Ears: External normal; on initial exam, cerumen impaction R ear noted; after lavage, canals clear; tympanic membranes normal  Oropharynx: Pink, supple. No suspicious lesions  Neck: Supple without thyromegaly, adenopathy  Lungs: Respirations unlabored; clear to auscultation bilaterally without wheeze, rales, rhonchi  CVS exam: normal rate and regular rhythm.  Abdomen: Soft; nontender; nondistended; normoactive bowel sounds; no masses or hepatosplenomegaly  Musculoskeletal: No deformities; no active joint inflammation  Extremities: No edema, cyanosis, clubbing  Vessels: Symmetric bilaterally  Neurologic: Alert and oriented; speech intact; face symmetrical; moves all extremities well; CNII-XII intact  without focal deficit   Assessment:  1. PE (physical exam), annual   2. Colon cancer screening   3. Cervical cancer screening   4. Essential hypertension   5. Lipid screening   6. Elevated glucose   7. Skin lesion   8. Irregular bleeding   9. Impacted cerumen of right ear     Plan:  Age appropriate preventive healthcare needs addressed; encouraged regular eye  doctor and dental exams; encouraged regular exercise; will update labs and refills as needed today; follow-up to be determined; Thin Prep pap collected; re-order Cologuard;  Will refer to GYN and dermatology; Ear lavage completed in office with no complications;    No follow-ups on file.  Orders Placed This Encounter  Procedures   Cologuard   CBC with Differential/Platelet   Comp Met (CMET)   Lipid panel   Hemoglobin A1c   Ambulatory referral to Dermatology    Referral Priority:   Routine    Referral Type:   Consultation    Referral Reason:   Specialty Services Required    Requested Specialty:   Dermatology    Number of Visits Requested:   1   Ambulatory referral to Gynecology    Referral Priority:   Routine    Referral Type:   Consultation    Referral Reason:   Specialty Services Required    Requested Specialty:   Gynecology    Number of Visits Requested:   1    Requested Prescriptions   Signed Prescriptions Disp Refills   NIFEdipine (PROCARDIA XL/NIFEDICAL-XL) 90 MG 24 hr tablet 90 tablet 3    Sig: Take 1 tablet (90 mg total) by mouth daily.

## 2022-06-24 IMAGING — MR MR KNEE*L* W/O CM
4 of 6 series · 23 of 40 positions shown · non-contrast
Comparison: Left knee x-ray 09/27/2021

CLINICAL DATA: Knee pain

EXAM:
MRI OF THE LEFT KNEE WITHOUT CONTRAST
TECHNIQUE: Multiplanar, multisequence MR imaging of the knee was performed. No
intravenous contrast was administered.

[Series 4: T2 fat-sat · coronal · 4.0mm · 0.59mm/px · 6 of 24 slices shown (1 of 2)]
[im 1/24]
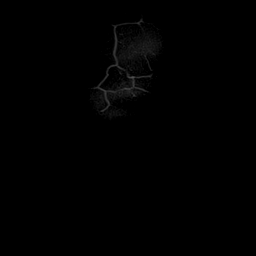
[im 5/24]
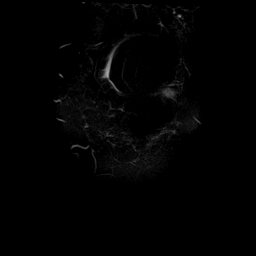
[im 10/24]
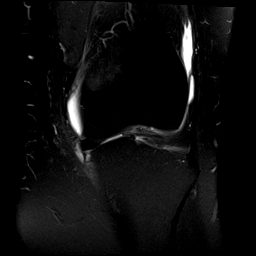
[im 14/24]
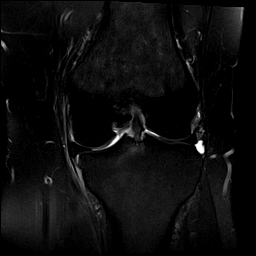
[im 19/24]
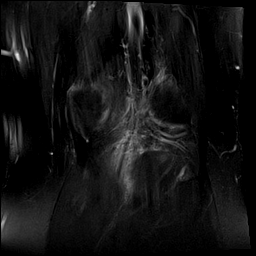
[im 24/24]
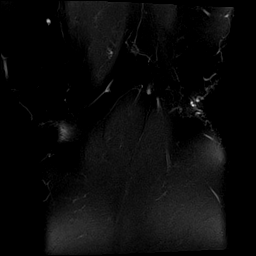

[Series 5: T1 · coronal · 4.0mm · 0.29mm/px · 3 of 24 slices shown]
[im 5/24]
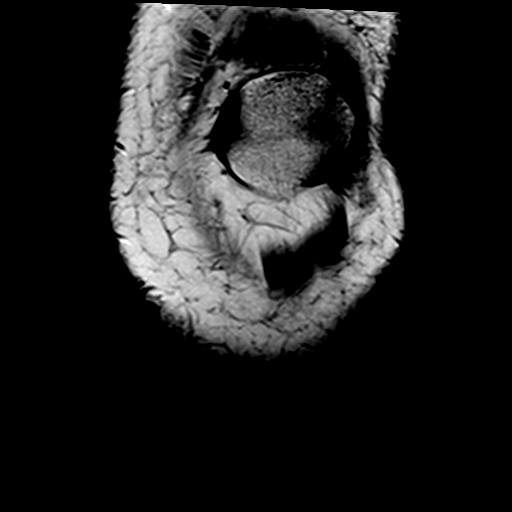
[im 14/24]
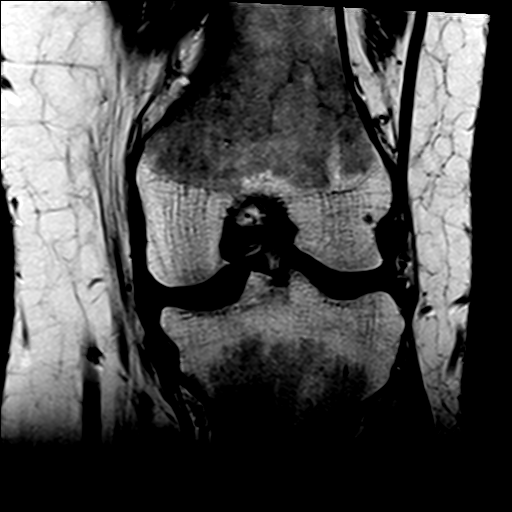
[im 24/24]
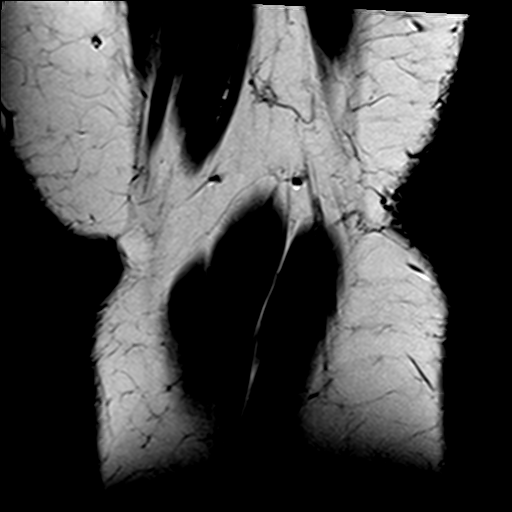

[Series 7: PD fat-sat · sagittal · 3.0mm · 0.29mm/px · 7 of 28 slices shown]
[im 1/28]
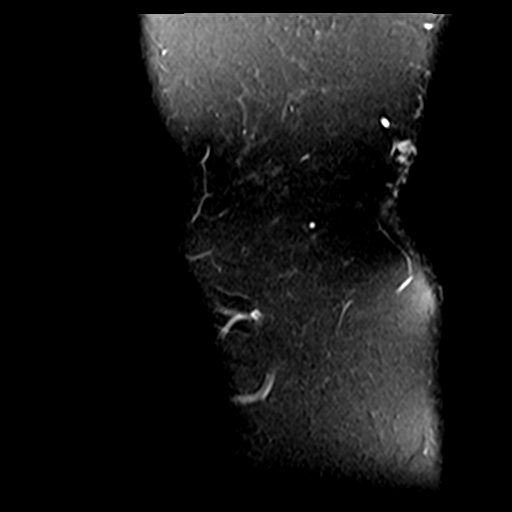
[im 5/28]
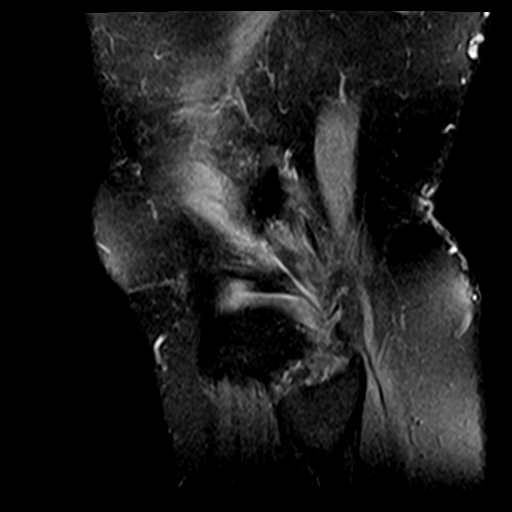
[im 10/28]
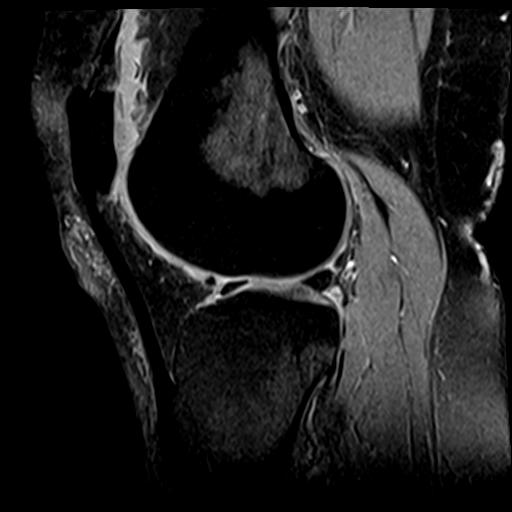
[im 14/28]
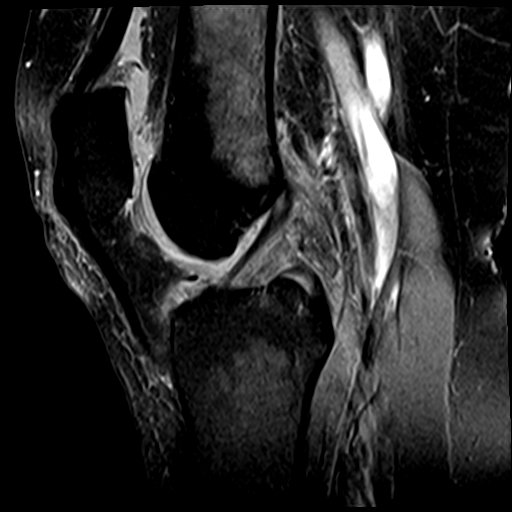
[im 19/28]
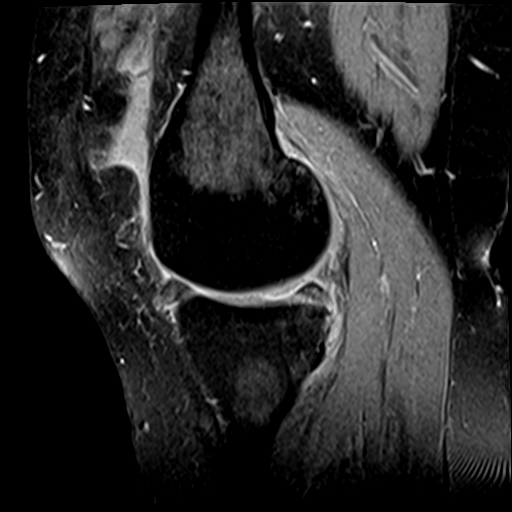
[im 23/28]
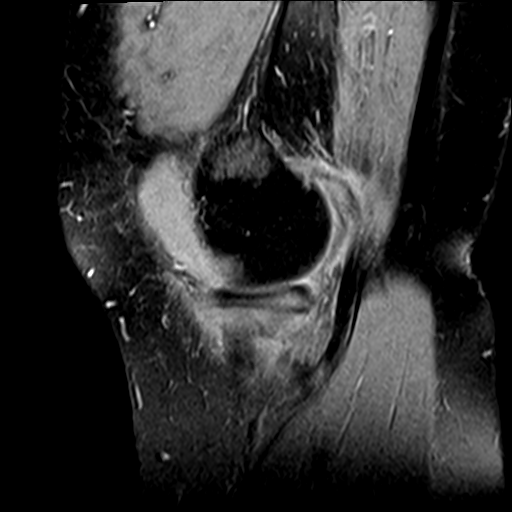
[im 28/28]
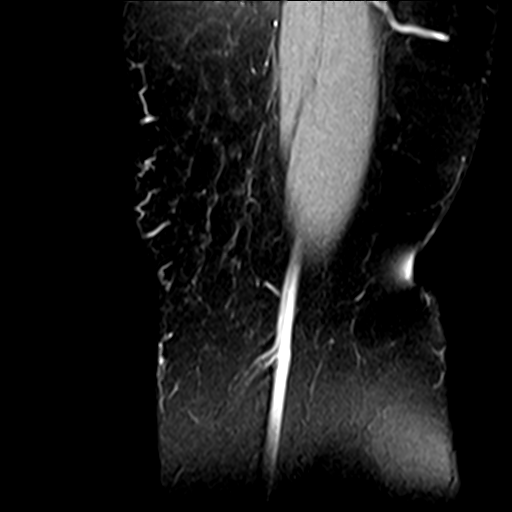

[Series 8: T2 fat-sat · sagittal · 3.0mm · 0.29mm/px · 7 of 28 slices shown (2 of 2)]
[im 1/28]
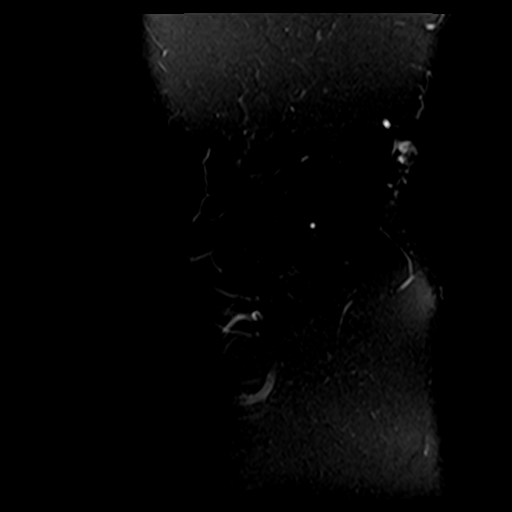
[im 5/28]
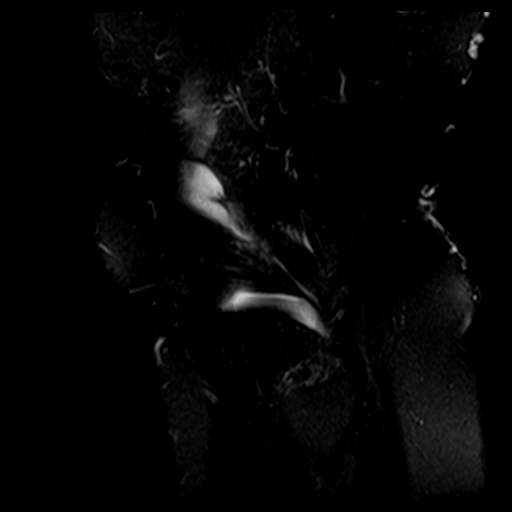
[im 10/28]
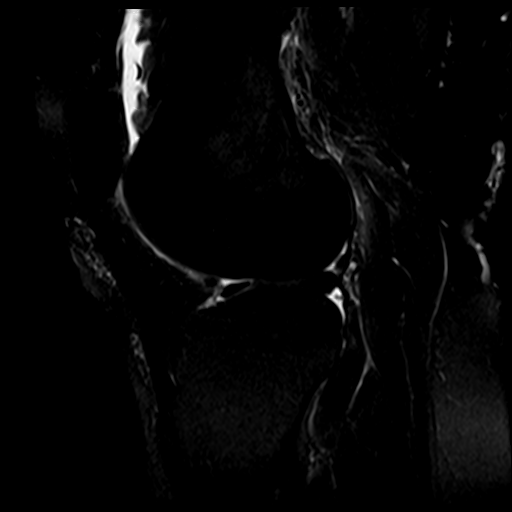
[im 14/28]
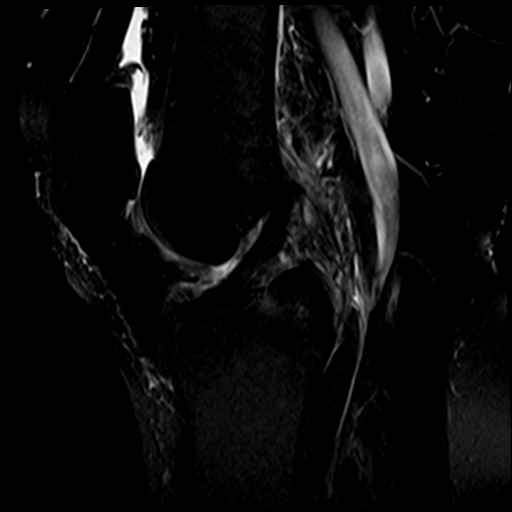
[im 19/28]
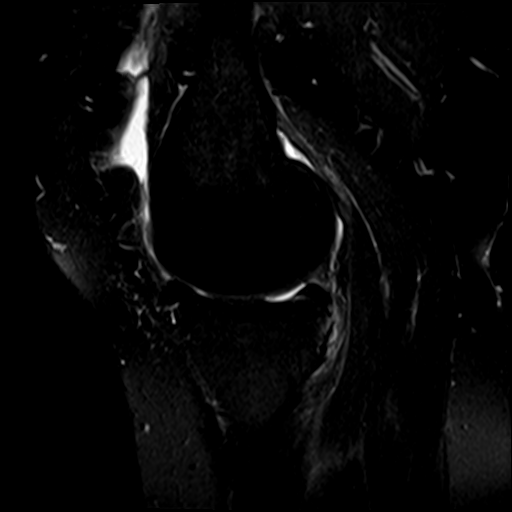
[im 23/28]
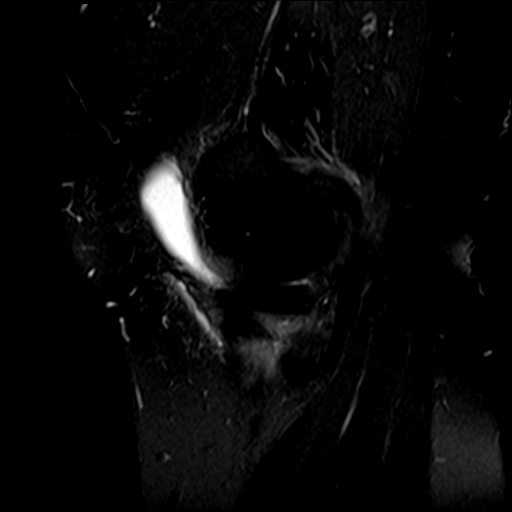
[im 28/28]
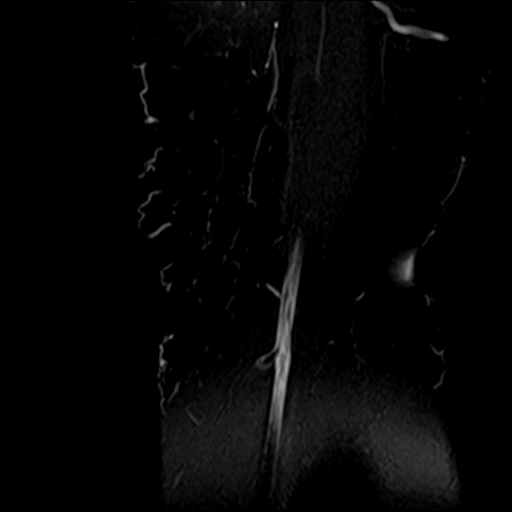

[23 of 40 positions shown; findings below may reference images not displayed]

FINDINGS: Limited study due to motion.

MENISCI

Medial meniscus: Tear of the posterior root with approximately 5 mm
medial extrusion of the body of the meniscus. There is also
extensive amorphous abnormal increased intrasubstance signal
throughout the posterior body and horn, type 2 C signal.

Lateral meniscus:  Intact.

LIGAMENTS

Cruciates: ACL and PCL are intact. There is increased signal in the
ACL.

Collaterals: Medial collateral ligament is intact with mild
surrounding soft tissue edema. Lateral collateral ligament complex
is intact.

CARTILAGE

Patellofemoral: Appears to be mild thinning and irregularity of the
articular cartilage with no significant focal full-thickness defects
visualized.

Medial: Mild thinning and irregularity of the articular cartilage at
the weight-bearing surfaces with no full-thickness defects
identified.

Lateral:  No chondral defect.

Joint: Moderate joint effusion. Normal Hoffa's fat. No plical
thickening.

Popliteal Fossa:  No Baker cyst. Intact popliteus tendon.

Extensor Mechanism:  Intact quadriceps tendon and patellar tendon.

Bones: No focal marrow signal abnormality. No fracture or
dislocation.

Other: None.
IMPRESSION: 1. Tear of the posterior root medial meniscus with 5 mm medial
extrusion of the body of the meniscus. Extensive type 2 C signal
throughout the posterior body and horn medial meniscus.
2. Edema about the MCL which may represent grade 1 sprain or
secondary to adjacent medial pathology.
3. Increased signal in the intact ACL, possibly degenerative or mild
sprain.
4. Moderate joint effusion.

## 2022-06-25 LAB — CYTOLOGY - PAP
Comment: NEGATIVE
Diagnosis: NEGATIVE
High risk HPV: NEGATIVE

## 2022-06-26 ENCOUNTER — Telehealth: Payer: Self-pay

## 2022-06-26 NOTE — Telephone Encounter (Signed)
Patient notified of lab results and follow up appointment made.

## 2022-06-26 NOTE — Telephone Encounter (Signed)
Caller Name [place] Phone Number [phone] Call Type Message Only Information Provided Reason for Call Returning a Call from the Office Initial Comment Caller states she is returning a call from the office. Disp. Time Disposition Final User 06/25/2022 5:17:52 PM General Information Provided Yes Maureen Ralphs Call Closed By: Maureen Ralphs Transaction Date/Time: 06/25/2022 5:15:58 PM (ET)

## 2022-07-22 ENCOUNTER — Other Ambulatory Visit (HOSPITAL_COMMUNITY): Payer: Self-pay

## 2022-07-22 MED ORDER — AMOXICILLIN 500 MG PO CAPS
500.0000 mg | ORAL_CAPSULE | Freq: Three times a day (TID) | ORAL | 0 refills | Status: DC
  Filled 2022-07-22: qty 21, 7d supply, fill #0

## 2022-08-02 ENCOUNTER — Ambulatory Visit
Admission: RE | Admit: 2022-08-02 | Discharge: 2022-08-02 | Disposition: A | Discharge status: Home / Self Care | Payer: Commercial Managed Care - PPO | Source: Ambulatory Visit | Attending: Family | Admitting: Family

## 2022-08-02 DIAGNOSIS — Z1231 Encounter for screening mammogram for malignant neoplasm of breast: Secondary | ICD-10-CM | POA: Diagnosis not present

## 2022-08-29 ENCOUNTER — Other Ambulatory Visit (HOSPITAL_COMMUNITY): Payer: Self-pay

## 2022-09-09 ENCOUNTER — Other Ambulatory Visit (HOSPITAL_COMMUNITY): Payer: Self-pay

## 2022-11-12 ENCOUNTER — Telehealth: Payer: Self-pay | Admitting: Family

## 2022-11-12 NOTE — Telephone Encounter (Signed)
Spoke with pt, pt states she would like to see vein specialist for leg pain and pt would like to visit the dermatologist for the bump on her buttock.

## 2022-11-12 NOTE — Telephone Encounter (Signed)
Pt called to request a referral to Dermatology and Vein and Vascular specialists. Pt stated she was referred before and that provider has seen her for these issues in the past. Please call to advise.

## 2022-11-13 ENCOUNTER — Other Ambulatory Visit: Payer: Self-pay | Admitting: Family

## 2022-11-13 DIAGNOSIS — I872 Venous insufficiency (chronic) (peripheral): Secondary | ICD-10-CM

## 2022-11-13 DIAGNOSIS — L989 Disorder of the skin and subcutaneous tissue, unspecified: Secondary | ICD-10-CM

## 2022-11-29 ENCOUNTER — Other Ambulatory Visit: Payer: Self-pay | Admitting: *Deleted

## 2022-11-29 DIAGNOSIS — I872 Venous insufficiency (chronic) (peripheral): Secondary | ICD-10-CM

## 2022-12-05 ENCOUNTER — Other Ambulatory Visit (HOSPITAL_COMMUNITY): Payer: Self-pay

## 2022-12-10 ENCOUNTER — Ambulatory Visit (HOSPITAL_COMMUNITY)
Admission: RE | Admit: 2022-12-10 | Discharge: 2022-12-10 | Disposition: A | Discharge status: Home / Self Care | Payer: Commercial Managed Care - PPO | Source: Ambulatory Visit | Attending: Vascular Surgery | Admitting: Vascular Surgery

## 2022-12-10 DIAGNOSIS — I872 Venous insufficiency (chronic) (peripheral): Secondary | ICD-10-CM

## 2022-12-17 ENCOUNTER — Ambulatory Visit (INDEPENDENT_AMBULATORY_CARE_PROVIDER_SITE_OTHER): Payer: Commercial Managed Care - PPO | Admitting: Physician Assistant

## 2022-12-17 VITALS — BP 154/84 | HR 83 | Temp 98.0°F

## 2022-12-17 DIAGNOSIS — I872 Venous insufficiency (chronic) (peripheral): Secondary | ICD-10-CM

## 2022-12-17 DIAGNOSIS — I8393 Asymptomatic varicose veins of bilateral lower extremities: Secondary | ICD-10-CM | POA: Diagnosis not present

## 2022-12-17 NOTE — Progress Notes (Signed)
z   Requested by:  [name], FNP [address] [place],  [place] [address]  Reason for consultation: painful spider veins    History of Present Illness   Colleen Potter is a 57 y.o. (Mar 03, 1966) female who presents for evaluation of painful spider veins.  She has previously been seen by Dr. Chestine Spore in 2019 for lower extremity swelling.  She was found to have mostly deep system reflux and encouraged to wear thigh-high compression stockings to help with her symptoms.  She has no prior history of DVT.  She returns today for repeat evaluation.  She still has bilateral lower extremity swelling.  She tried to wear compression stockings however these were too tight and painful for her.  Her biggest concern at today's visit is the increasing pain she is having at her spider veins sites.  She has developed more spider and reticular veins of her bilateral lower extremities.  These cause her stabbing and burning pain after long days on her feet.  She denies any bleeding or ulceration events.  She works in Stage manager at [employer].  Past Medical History:  Diagnosis Date   Hypertension    states under control with med., has been on med. x 2 yr.   Trimalleolar fracture of ankle, closed 08/03/2016   right    Past Surgical History:  Procedure Laterality Date   NO PAST SURGERIES     ORIF ANKLE FRACTURE Right 08/07/2016   Procedure: OPEN REDUCTION INTERNAL FIXATION (ORIF) RIGHT ANKLE FRACTURE;  Surgeon: Tarry Kos, MD;  Location: [facility];  Service: Orthopedics;  Laterality: Right;    Social History   Socioeconomic History   Marital status: Single    Spouse name: Not on file   Number of children: 2   Years of education: 12   Highest education level: Not on file  Occupational History   Occupation: EDS  Tobacco Use   Smoking status: Never   Smokeless tobacco: Never  Substance and Sexual Activity   Alcohol use: No    Alcohol/week: 0.0 standard  drinks of alcohol   Drug use: No   Sexual activity: Not on file  Other Topics Concern   Not on file  Social History Narrative   Fun: Shop.   Denies abuse and feels safe at home.    Social Determinants of Health   Financial Resource Strain: Not on file  Food Insecurity: Not on file  Transportation Needs: Not on file  Physical Activity: Not on file  Stress: Not on file  Social Connections: Not on file  Intimate Partner Violence: Not on file   No family history on file.  Current Outpatient Medications  Medication Sig Dispense Refill   NIFEdipine (PROCARDIA XL/NIFEDICAL-XL) 90 MG 24 hr tablet Take 1 tablet (90 mg total) by mouth daily. 90 tablet 3   No current facility-administered medications for this visit.    No Known Allergies  REVIEW OF SYSTEMS (negative unless checked):   Cardiac:  []  Chest pain or chest pressure? []  Shortness of breath upon activity? []  Shortness of breath when lying flat? []  Irregular heart rhythm?  Vascular:  []  Pain in calf, thigh, or hip brought on by walking? []  Pain in feet at night that wakes you up from your sleep? []  Blood clot in your veins? [x]  Leg swelling?  Pulmonary:  []  Oxygen at home? []  Productive cough? []  Wheezing?  Neurologic:  []  Sudden weakness in arms or legs? []  Sudden  numbness in arms or legs? []  Sudden onset of difficult speaking or slurred speech? []  Temporary loss of vision in one eye? []  Problems with dizziness?  Gastrointestinal:  []  Blood in stool? []  Vomited blood?  Genitourinary:  []  Burning when urinating? []  Blood in urine?  Psychiatric:  []  Major depression  Hematologic:  []  Bleeding problems? []  Problems with blood clotting?  Dermatologic:  []  Rashes or ulcers?  Constitutional:  []  Fever or chills?  Ear/Nose/Throat:  []  Change in hearing? []  Nose bleeds? []  Sore throat?  Musculoskeletal:  []  Back pain? []  Joint pain? []  Muscle pain?   Physical Examination     Vitals:    12/17/22 0957  BP: (!) 154/84  Pulse: 83  Temp: 98 F (36.7 C)  TempSrc: Temporal  SpO2: 97%   There is no height or weight on file to calculate BMI.  General:  WDWN in NAD; vital signs documented above Gait: Not observed HENT: WNL, normocephalic Pulmonary: normal non-labored breathing , without Rales, rhonchi,  wheezing Cardiac: regular HR Skin: without rashes Vascular Exam/Pulses: palpable pedal pulses Extremities: 1+ edema of bilateral lower extremities.  Multiple scattered reticular and spider veins of bilateral lower extremities Musculoskeletal: no muscle wasting or atrophy  Neurologic: A&O X 3;  No focal weakness or paresthesias are detected Psychiatric:  The pt has Normal affect.   Non-invasive Vascular Imaging   LLE Venous Insufficiency Duplex (12/10/2022):  LEFT           Reflux NoRefluxReflux TimeDiameter cmsComments                                       Yes                                               +---------------+---------+------+-----------+------------+----------------  ---+  CFV                     yes   >1 second                                   +---------------+---------+------+-----------+------------+----------------  ---+  FV prox        no                                                          +---------------+---------+------+-----------+------------+----------------  ---+  FV mid         no                                                          +---------------+---------+------+-----------+------------+----------------  ---+  FV dist        no                                                          +---------------+---------+------+-----------+------------+----------------  ---+  Popliteal     no                                                          +---------------+---------+------+-----------+------------+----------------  ---+  GSV at [facility]               yes    >500 ms       0.72                          +---------------+---------+------+-----------+------------+----------------  ---+  GSV prox thigh no                            0.66                          +---------------+---------+------+-----------+------------+----------------  ---+  GSV mid thigh  no                            0.42                          +---------------+---------+------+-----------+------------+----------------  ---+  GSV dist thigh no                            0.35                          +---------------+---------+------+-----------+------------+----------------  ---+  GSV at knee              yes    >500 ms      0.30                          +---------------+---------+------+-----------+------------+----------------  ---+  GSV prox calf  no                            0.22    branching             +---------------+---------+------+-----------+------------+----------------  ---+  SSV Pop Fossa            yes    >500 ms      0.26                          +---------------+---------+------+-----------+------------+----------------  ---+  SSV prox calf            yes    >500 ms      0.65    anuerysmal  dilation  +---------------+---------+------+-----------+------------+----------------  ---+  AASV Prox thighno                            0.39                          +---------------+---------+------+-----------+------------+----------------  ---+  PASV Prox thigh          yes    >500 ms      0.59                          +---------------+---------+------+-----------+------------+----------------  Medical Decision Making   HAILEE KEITT is a 57 y.o. female who presents for evaluation of painful spider and reticular veins  The patient has a prior history of lower extremity swelling, which has been largely due to deep system reflux.  She returns today with repeat left lower extremity venous study.  This  demonstrates reflux in the left common femoral vein, greater saphenous vein at the saphenofemoral junction and knee, and small saphenous vein.  The remainder of the deep and superficial venous system is patent.  There is no DVT or SVT Unfortunately the patient's greater saphenous vein is too small for ablation.  Most of her lower extremity swelling is still likely due to deep system reflux.  She has tried to use thigh-high compression stockings, but these were too tight and painful for her.  I have recommended she try to use ace wraps on her upper and lower legs to help with swelling and she is open to this. Her biggest concern is getting treatment for her painful spider and reticular veins.  She would like to focus on her left leg first.  These veins will cause her a lot of stabbing and burning pain after being on her feet for long period of time. She is aware that the cost for sclerotherapy is out-of-pocket.  She is still interested in sclerotherapy treatment.  She will try ace wrap compression therapy for swelling and Harriett Sine will call the patient to discuss sclerotherapy  Lelaina Oatis Sharin Mons, PA-C [facility] Office: [phone]  12/17/2022, 10:21 AM  Clinic MD: Steve Rattler

## 2022-12-31 ENCOUNTER — Other Ambulatory Visit: Payer: Self-pay | Admitting: Family

## 2022-12-31 ENCOUNTER — Encounter: Payer: Self-pay | Admitting: Family

## 2022-12-31 ENCOUNTER — Ambulatory Visit: Payer: Commercial Managed Care - PPO | Admitting: Family

## 2022-12-31 ENCOUNTER — Other Ambulatory Visit (HOSPITAL_COMMUNITY): Payer: Self-pay

## 2022-12-31 ENCOUNTER — Other Ambulatory Visit: Payer: Self-pay

## 2022-12-31 VITALS — BP 138/80 | HR 68 | Ht 64.0 in | Wt 199.4 lb

## 2022-12-31 DIAGNOSIS — R7989 Other specified abnormal findings of blood chemistry: Secondary | ICD-10-CM

## 2022-12-31 DIAGNOSIS — I10 Essential (primary) hypertension: Secondary | ICD-10-CM

## 2022-12-31 DIAGNOSIS — Z1211 Encounter for screening for malignant neoplasm of colon: Secondary | ICD-10-CM

## 2022-12-31 DIAGNOSIS — R739 Hyperglycemia, unspecified: Secondary | ICD-10-CM | POA: Diagnosis not present

## 2022-12-31 DIAGNOSIS — N950 Postmenopausal bleeding: Secondary | ICD-10-CM

## 2022-12-31 LAB — COMPREHENSIVE METABOLIC PANEL
ALT: 10 U/L (ref 0–35)
AST: 13 U/L (ref 0–37)
Albumin: 4.5 g/dL (ref 3.5–5.2)
Alkaline Phosphatase: 50 U/L (ref 39–117)
BUN: 27 mg/dL — ABNORMAL HIGH (ref 6–23)
CO2: 25 mEq/L (ref 19–32)
Calcium: 9.5 mg/dL (ref 8.4–10.5)
Chloride: 104 mEq/L (ref 96–112)
Creatinine, Ser: 1.71 mg/dL — ABNORMAL HIGH (ref 0.40–1.20)
GFR: 33.05 mL/min — ABNORMAL LOW (ref >60.00)
Glucose, Bld: 103 mg/dL — ABNORMAL HIGH (ref 70–99)
Potassium: 4.4 mEq/L (ref 3.5–5.1)
Sodium: 139 mEq/L (ref 135–145)
Total Bilirubin: 0.4 mg/dL (ref 0.2–1.2)
Total Protein: 8.6 g/dL — ABNORMAL HIGH (ref 6.0–8.3)

## 2022-12-31 LAB — HEMOGLOBIN A1C: Hgb A1c MFr Bld: 6.1 % (ref 4.6–6.5)

## 2022-12-31 MED ORDER — TRIAMCINOLONE ACETONIDE 0.1 % EX CREA
1.0000 | TOPICAL_CREAM | Freq: Two times a day (BID) | CUTANEOUS | 0 refills | Status: DC
  Filled 2022-12-31: qty 80, 10d supply, fill #0

## 2022-12-31 NOTE — Progress Notes (Signed)
Colleen Potter is a 57 y.o. female with the following history as recorded in EpicCare:  Patient Active Problem List   Diagnosis Date Noted   Acute medial meniscus tear of left knee 12/04/2021   Chronic venous insufficiency 05/12/2018   Displaced trimalleolar fracture of right lower leg, initial encounter for closed fracture 08/06/2016   Essential hypertension 10/16/2015    Current Outpatient Medications  Medication Sig Dispense Refill   NIFEdipine (PROCARDIA XL/NIFEDICAL-XL) 90 MG 24 hr tablet Take 1 tablet (90 mg total) by mouth daily. 90 tablet 3   triamcinolone cream (KENALOG) 0.1 % Apply 1 Application topically 2 (two) times daily. 80 g 0   No current facility-administered medications for this visit.    Allergies: Patient has no known allergies.  Past Medical History:  Diagnosis Date   Hypertension    states under control with med., has been on med. x 2 yr.   Trimalleolar fracture of ankle, closed 08/03/2016   right    Past Surgical History:  Procedure Laterality Date   NO PAST SURGERIES     ORIF ANKLE FRACTURE Right 08/07/2016   Procedure: OPEN REDUCTION INTERNAL FIXATION (ORIF) RIGHT ANKLE FRACTURE;  Surgeon: Tarry Kos, MD;  Location: [facility];  Service: Orthopedics;  Laterality: Right;    No family history on file.  Social History   Tobacco Use   Smoking status: Never   Smokeless tobacco: Never  Substance Use Topics   Alcohol use: No    Alcohol/week: 0.0 standard drinks of alcohol    Subjective:   6 month follow up on chronic care needs; no acute concerns today;  Still has not received Cologuard; was not contacted to schedule regarding GYN follow up from December;   Objective:  Vitals:   12/31/22 0809  BP: 138/80  Pulse: 68  SpO2: 98%  Weight: 199 lb 6.4 oz (90.4 kg)  Height: 5\' 4"  (1.626 m)    General: Well developed, well nourished, in no acute distress  Skin : Warm and dry.  Head: Normocephalic and atraumatic  Eyes: Sclera and  conjunctiva clear; pupils round and reactive to light; extraocular movements intact  Ears: External normal; canals clear; tympanic membranes normal  Oropharynx: Pink, supple. No suspicious lesions  Neck: Supple without thyromegaly, adenopathy  Lungs: Respirations unlabored; clear to auscultation bilaterally without wheeze, rales, rhonchi  CVS exam: normal rate and regular rhythm.  Neurologic: Alert and oriented; speech intact; face symmetrical; moves all extremities well; CNII-XII intact without focal deficit   Assessment:  1. Elevated blood sugar   2. Colon cancer screening   3. Post-menopausal bleeding   4. Essential hypertension     Plan:  Check Hgba1c today; Order placed again for Cologuard; Referral updated to GYN- she is aware to let us know if she is not contacted to schedule in the next 2 weeks; stressed need to follow up on this referral;  Stable; continue same medication; known white coat HTN; she checks her blood pressure regularly at work and is always normal;   No follow-ups on file.  Orders Placed This Encounter  Procedures   Comp Met (CMET)   Hemoglobin A1c   Cologuard   Ambulatory referral to Obstetrics / Gynecology    Referral Priority:   Routine    Referral Type:   Consultation    Referral Reason:   Specialty Services Required    Requested Specialty:   Obstetrics and Gynecology    Number of Visits Requested:   1  Requested Prescriptions   Signed Prescriptions Disp Refills   triamcinolone cream (KENALOG) 0.1 % 80 g 0    Sig: Apply 1 Application topically 2 (two) times daily.

## 2023-01-13 ENCOUNTER — Telehealth: Payer: Self-pay

## 2023-01-13 ENCOUNTER — Other Ambulatory Visit (INDEPENDENT_AMBULATORY_CARE_PROVIDER_SITE_OTHER): Payer: Commercial Managed Care - PPO

## 2023-01-13 DIAGNOSIS — R7989 Other specified abnormal findings of blood chemistry: Secondary | ICD-10-CM

## 2023-01-13 DIAGNOSIS — Z1211 Encounter for screening for malignant neoplasm of colon: Secondary | ICD-10-CM | POA: Diagnosis not present

## 2023-01-13 LAB — BASIC METABOLIC PANEL
BUN: 39 mg/dL — ABNORMAL HIGH (ref 6–23)
CO2: 24 mEq/L (ref 19–32)
Calcium: 9.6 mg/dL (ref 8.4–10.5)
Chloride: 105 mEq/L (ref 96–112)
Creatinine, Ser: 2.32 mg/dL — ABNORMAL HIGH (ref 0.40–1.20)
GFR: 22.92 mL/min — ABNORMAL LOW (ref >60.00)
Glucose, Bld: 93 mg/dL (ref 70–99)
Potassium: 4.4 mEq/L (ref 3.5–5.1)
Sodium: 139 mEq/L (ref 135–145)

## 2023-01-13 NOTE — Telephone Encounter (Signed)
Patient Name: Colleen Potter, Colleen Potter [employer] Order Number (if applicable): [id number]  We were able to ship out a test for the patient as requested. The kit was sent to the address on file. This is the 5th kit that has been shipped to the patient since 2019. If they do not receive it, they will need to contact our Patient Support Specialists at [phone] for assistance.   If you have additional questions please let us know! You can send a new message or reach [place] via phone at [phone] for Cologuard related concerns.

## 2023-01-14 ENCOUNTER — Emergency Department (HOSPITAL_COMMUNITY)
Admission: EM | Admit: 2023-01-14 | Discharge: 2023-01-14 | Disposition: A | Discharge status: Home / Self Care | Payer: Commercial Managed Care - PPO | Attending: Emergency Medicine | Admitting: Emergency Medicine

## 2023-01-14 ENCOUNTER — Other Ambulatory Visit: Payer: Self-pay

## 2023-01-14 ENCOUNTER — Encounter (HOSPITAL_COMMUNITY): Payer: Self-pay

## 2023-01-14 DIAGNOSIS — Z5329 Procedure and treatment not carried out because of patient's decision for other reasons: Secondary | ICD-10-CM | POA: Insufficient documentation

## 2023-01-14 DIAGNOSIS — I10 Essential (primary) hypertension: Secondary | ICD-10-CM | POA: Diagnosis not present

## 2023-01-14 DIAGNOSIS — N179 Acute kidney failure, unspecified: Secondary | ICD-10-CM | POA: Diagnosis not present

## 2023-01-14 DIAGNOSIS — D649 Anemia, unspecified: Secondary | ICD-10-CM | POA: Insufficient documentation

## 2023-01-14 DIAGNOSIS — R799 Abnormal finding of blood chemistry, unspecified: Secondary | ICD-10-CM | POA: Diagnosis present

## 2023-01-14 DIAGNOSIS — Z5321 Procedure and treatment not carried out due to patient leaving prior to being seen by health care provider: Secondary | ICD-10-CM

## 2023-01-14 LAB — BASIC METABOLIC PANEL
Anion gap: 11 (ref 5–15)
BUN: 40 mg/dL — ABNORMAL HIGH (ref 6–20)
CO2: 21 mmol/L — ABNORMAL LOW (ref 22–32)
Calcium: 9.4 mg/dL (ref 8.9–10.3)
Chloride: 104 mmol/L (ref 98–111)
Creatinine, Ser: 2.29 mg/dL — ABNORMAL HIGH (ref 0.44–1.00)
GFR, Estimated: 24 mL/min — ABNORMAL LOW (ref >60)
Glucose, Bld: 81 mg/dL (ref 70–99)
Potassium: 5.2 mmol/L — ABNORMAL HIGH (ref 3.5–5.1)
Sodium: 136 mmol/L (ref 135–145)

## 2023-01-14 LAB — CBC WITH DIFFERENTIAL/PLATELET
Abs Immature Granulocytes: 0.01 10*3/uL (ref 0.00–0.07)
Basophils Absolute: 0.1 10*3/uL (ref 0.0–0.1)
Basophils Relative: 1 %
Eosinophils Absolute: 0.1 10*3/uL (ref 0.0–0.5)
Eosinophils Relative: 2 %
HCT: 32.9 % — ABNORMAL LOW (ref 36.0–46.0)
Hemoglobin: 10.6 g/dL — ABNORMAL LOW (ref 12.0–15.0)
Immature Granulocytes: 0 %
Lymphocytes Relative: 38 %
Lymphs Abs: 2.4 10*3/uL (ref 0.7–4.0)
MCH: 30.2 pg (ref 26.0–34.0)
MCHC: 32.2 g/dL (ref 30.0–36.0)
MCV: 93.7 fL (ref 80.0–100.0)
Monocytes Absolute: 0.4 10*3/uL (ref 0.1–1.0)
Monocytes Relative: 6 %
Neutro Abs: 3.2 10*3/uL (ref 1.7–7.7)
Neutrophils Relative %: 53 %
Platelets: 323 10*3/uL (ref 150–400)
RBC: 3.51 MIL/uL — ABNORMAL LOW (ref 3.87–5.11)
RDW: 12.7 % (ref 11.5–15.5)
WBC: 6.2 10*3/uL (ref 4.0–10.5)
nRBC: 0 % (ref 0.0–0.2)

## 2023-01-14 LAB — URINALYSIS, ROUTINE W REFLEX MICROSCOPIC
Bacteria, UA: NONE SEEN
Bilirubin Urine: NEGATIVE
Glucose, UA: NEGATIVE mg/dL
Hgb urine dipstick: NEGATIVE
Ketones, ur: NEGATIVE mg/dL
Nitrite: NEGATIVE
Protein, ur: NEGATIVE mg/dL
Specific Gravity, Urine: 1.008 (ref 1.005–1.030)
pH: 7 (ref 5.0–8.0)

## 2023-01-14 LAB — SODIUM, URINE, RANDOM: Sodium, Ur: 93 mmol/L

## 2023-01-14 LAB — CREATININE, URINE, RANDOM: Creatinine, Urine: 44 mg/dL

## 2023-01-14 MED ORDER — SODIUM ZIRCONIUM CYCLOSILICATE 10 G PO PACK
10.0000 g | PACK | Freq: Once | ORAL | Status: AC
  Administered 2023-01-14: 10 g via ORAL
  Filled 2023-01-14: qty 1

## 2023-01-14 MED ORDER — LACTATED RINGERS IV BOLUS
1000.0000 mL | Freq: Once | INTRAVENOUS | Status: AC
  Administered 2023-01-14: 1000 mL via INTRAVENOUS

## 2023-01-14 NOTE — ED Provider Notes (Signed)
[place] EMERGENCY DEPARTMENT AT [facility] Provider Note   CSN: [id number] Arrival date & time: 01/14/23  1529     History  Chief Complaint  Patient presents with   abnormal labs    Colleen Potter is a 57 y.o. female.  Patient is a 57 year old female with a past medical history of hypertension presenting to the emergency department for abnormal labs.  She states that she had labs drawn by her primary doctor yesterday and was told that she needed to come to the emergency department.  She states that she has been feeling well recently without any nausea, vomiting or diarrhea.  She denies any abdominal pain, dysuria or hematuria.  She denies any recent medication changes.  She states that she has been urinating a normal number of times per day.  The history is provided by the patient.       Home Medications Prior to Admission medications   Medication Sig Start Date End Date Taking? Authorizing Provider  NIFEdipine (PROCARDIA XL/NIFEDICAL-XL) 90 MG 24 hr tablet Take 1 tablet (90 mg total) by mouth daily. 06/20/22   [name], FNP  triamcinolone cream (KENALOG) 0.1 % Apply 1 Application topically 2 (two) times daily. 12/31/22   [name], FNP      Allergies    Patient has no known allergies.    Review of Systems   Review of Systems  Physical Exam Updated Vital Signs BP (!) 166/92 (BP Location: Right Arm)   Pulse 85   Temp 98 F (36.7 C) (Oral)   Resp 18   Ht 5\' 4"  (1.626 m)   Wt 90.7 kg   SpO2 100%   BMI 34.32 kg/m  Physical Exam Vitals and nursing note reviewed.  Constitutional:      Appearance: Normal appearance.  HENT:     Head: Normocephalic and atraumatic.     Nose: Nose normal.     Mouth/Throat:     Mouth: Mucous membranes are moist.     Pharynx: Oropharynx is clear.  Eyes:     Extraocular Movements: Extraocular movements intact.     Conjunctiva/sclera: Conjunctivae normal.  Cardiovascular:     Rate and Rhythm: Normal  rate and regular rhythm.     Heart sounds: Normal heart sounds.  Pulmonary:     Effort: Pulmonary effort is normal.     Breath sounds: Normal breath sounds.  Abdominal:     General: Abdomen is flat.     Palpations: Abdomen is soft.     Tenderness: There is no abdominal tenderness.  Musculoskeletal:        General: Normal range of motion.     Cervical back: Normal range of motion.  Skin:    General: Skin is warm and dry.  Neurological:     General: No focal deficit present.     Mental Status: She is alert and oriented to person, place, and time.  Psychiatric:        Mood and Affect: Mood normal.        Behavior: Behavior normal.     ED Results / Procedures / Treatments   Labs (all labs ordered are listed, but only abnormal results are displayed) Labs Reviewed  BASIC METABOLIC PANEL - Abnormal; Notable for the following components:      Result Value   Potassium 5.2 (*)    CO2 21 (*)    BUN 40 (*)    Creatinine, Ser 2.29 (*)    GFR, Estimated 24 (*)  All other components within normal limits  CBC WITH DIFFERENTIAL/PLATELET - Abnormal; Notable for the following components:   RBC 3.51 (*)    Hemoglobin 10.6 (*)    HCT 32.9 (*)    All other components within normal limits  URINALYSIS, ROUTINE W REFLEX MICROSCOPIC - Abnormal; Notable for the following components:   Color, Urine STRAW (*)    Leukocytes,Ua TRACE (*)    All other components within normal limits  SODIUM, URINE, RANDOM  CREATININE, URINE, RANDOM    EKG EKG Interpretation Date/Time:  Tuesday January 14 2023 19:59:49 EDT Ventricular Rate:  71 PR Interval:  161 QRS Duration:  83 QT Interval:  355 QTC Calculation: 386 R Axis:   16  Text Interpretation: Sinus rhythm Confirmed by Elayne Snare ([id number]) on 01/14/2023 8:01:00 PM  Radiology No results found.  Procedures Procedures    Medications Ordered in ED Medications  lactated ringers bolus 1,000 mL (0 mLs Intravenous Stopped 01/14/23 1919)  sodium  zirconium cyclosilicate (LOKELMA) packet 10 g (10 g Oral Given 01/14/23 1713)    ED Course/ Medical Decision Making/ A&P Clinical Course as of 01/14/23 2107  Tue Jan 14, 2023  1956 Urine negative for UTI, post void residual 30 mL making an obstructive AKI unlikely. No hyperkalemic changes on EKG. FENa appears consistent with an intrinsic kidney dysfunction.  [VK]  2106 Patient eloped prior to reassessment for disposition. [VK]    Clinical Course User Index [VK] Rexford Maus, DO                             Medical Decision Making This patient presents to the ED with chief complaint(s) of abnormal labs with pertinent past medical history of HTN which further complicates the presenting complaint. The complaint involves an extensive differential diagnosis and also carries with it a high risk of complications and morbidity.    The differential diagnosis includes AKI, dehydration, obstruction less likely as normal urination, hypertensive kidney disease   Additional history obtained: Additional history obtained from N/A Records reviewed Primary Care Documents  ED Course and Reassessment: Patient was initially evaluated in triage and had labs performed.  Labs here did show creatinine of 2.2 which is stable from labs performed yesterday though uptrending from her recent outpatient labs a few weeks ago.  The patient had a mildly elevated potassium of 5.2 and mild anemia with a hemoglobin of 10.9, no history of any bleeding.  Discussed with the patient possible admission for her new AKI and she would prefer outpatient management.  The patient will be given IV fluids and Lokelma and will have EKG, urine and PVR performed to evaluate for possible etiologies of her AKI.  Independent labs interpretation:  The following labs were independently interpreted: AKI, Cr stable from yesterday  Independent visualization of imaging: - N/A  Consultation: - Consulted or discussed management/test  interpretation w/ external professional: N/A  Consideration for admission or further workup: Patient eloped prior to completion of evaluation     Amount and/or Complexity of Data Reviewed Labs: ordered.  Risk Prescription drug management.          Final Clinical Impression(s) / ED Diagnoses Final diagnoses:  AKI (acute kidney injury) (HCC)  Hypertension, unspecified type  Eloped from emergency department    Rx / DC Orders ED Discharge Orders     None         Rexford Maus, DO 01/14/23 2107

## 2023-01-14 NOTE — ED Notes (Signed)
Pt given graham crackers w/ EDP permission.

## 2023-01-14 NOTE — ED Notes (Signed)
Post residual void, no urine retained

## 2023-01-14 NOTE — ED Triage Notes (Signed)
Pt to ED from PCP office for abnormal labs and concern for AKI d/t high creatine.

## 2023-01-17 ENCOUNTER — Telehealth: Payer: Self-pay | Admitting: Family

## 2023-01-17 DIAGNOSIS — R109 Unspecified abdominal pain: Secondary | ICD-10-CM

## 2023-01-17 DIAGNOSIS — N179 Acute kidney failure, unspecified: Secondary | ICD-10-CM

## 2023-01-17 NOTE — Telephone Encounter (Signed)
Patient notified

## 2023-01-17 NOTE — Telephone Encounter (Signed)
I reviewed her notes regarding recent follow ER visit. I am very concerned about her kidney functions. I am going to put in urgent nephrology referral. I would also like to update imaging and will order renal stone CT for her.

## 2023-01-25 LAB — COLOGUARD: COLOGUARD: NEGATIVE

## 2023-02-07 ENCOUNTER — Ambulatory Visit: Admission: RE | Admit: 2023-02-07 | Payer: Commercial Managed Care - PPO | Source: Ambulatory Visit

## 2023-02-07 DIAGNOSIS — K8020 Calculus of gallbladder without cholecystitis without obstruction: Secondary | ICD-10-CM | POA: Diagnosis not present

## 2023-02-07 DIAGNOSIS — R935 Abnormal findings on diagnostic imaging of other abdominal regions, including retroperitoneum: Secondary | ICD-10-CM | POA: Diagnosis not present

## 2023-02-07 DIAGNOSIS — I700 Atherosclerosis of aorta: Secondary | ICD-10-CM | POA: Diagnosis not present

## 2023-02-07 DIAGNOSIS — R1030 Lower abdominal pain, unspecified: Secondary | ICD-10-CM | POA: Diagnosis not present

## 2023-02-07 DIAGNOSIS — R109 Unspecified abdominal pain: Secondary | ICD-10-CM

## 2023-02-10 ENCOUNTER — Telehealth: Payer: Self-pay

## 2023-02-10 ENCOUNTER — Other Ambulatory Visit: Payer: Self-pay | Admitting: Family

## 2023-02-10 DIAGNOSIS — N2889 Other specified disorders of kidney and ureter: Secondary | ICD-10-CM

## 2023-02-10 NOTE — Telephone Encounter (Signed)
Called pt and left a VM asking pt to call the office back.  

## 2023-02-10 NOTE — Telephone Encounter (Signed)
Spoke with Erskine Squibb from [facility] radiology, per CT renal stone study there has been a possible Thrombus or mass and furhter evaluation is recommended. CT or MRI w/ contrast. Please advise.

## 2023-02-11 NOTE — Telephone Encounter (Signed)
Spoke with pt, pt is aware of results and expressed understanding. Pt spoke with PCP was advised an MRI has been ordered, as well as going over what was found on the CT.  Also provided pt the phone number to reach out to kidney specialist where her referral was sent.

## 2023-02-25 ENCOUNTER — Telehealth: Payer: Self-pay | Admitting: Family

## 2023-02-25 NOTE — Telephone Encounter (Signed)
Spoke with pt, pt states she has an MRI scheduled for 03/09/2023 and she is claustrophobic. Advised pt maybe a medication can be prescribed for pt to take before the MRI, pt stated "that would not work" as she has to work on the day MRI has been scheduled and there will be no time for pt to take the medication beforehand. Pt requested a message be sent back to PCP.

## 2023-02-25 NOTE — Telephone Encounter (Signed)
Pt called stating that she wanted to have Colleen Potter give her a call about her MRI that is scheduled. Pt did not provide any other information.

## 2023-02-27 DIAGNOSIS — D631 Anemia in chronic kidney disease: Secondary | ICD-10-CM | POA: Diagnosis not present

## 2023-02-27 DIAGNOSIS — N2581 Secondary hyperparathyroidism of renal origin: Secondary | ICD-10-CM | POA: Diagnosis not present

## 2023-02-27 DIAGNOSIS — I129 Hypertensive chronic kidney disease with stage 1 through stage 4 chronic kidney disease, or unspecified chronic kidney disease: Secondary | ICD-10-CM | POA: Diagnosis not present

## 2023-02-27 DIAGNOSIS — N189 Chronic kidney disease, unspecified: Secondary | ICD-10-CM | POA: Diagnosis not present

## 2023-02-27 DIAGNOSIS — R809 Proteinuria, unspecified: Secondary | ICD-10-CM | POA: Diagnosis not present

## 2023-02-27 DIAGNOSIS — N179 Acute kidney failure, unspecified: Secondary | ICD-10-CM | POA: Diagnosis not present

## 2023-02-27 NOTE — Telephone Encounter (Signed)
Spoke with pt, pt states she would like some time to consider taking a medication to help get her through the MRI. Pt also would like to be notified if she is able to get a "standing MRI' done. Pt also stated she would like to know what would happen if she does not go through with the MRI, pt was advised of course that option is completely up to pt however the doctors would not be able to see how to treat what was seen in the CT, pt expressed understanding.

## 2023-03-03 ENCOUNTER — Other Ambulatory Visit (HOSPITAL_COMMUNITY): Payer: Self-pay

## 2023-03-05 ENCOUNTER — Telehealth: Payer: Self-pay | Admitting: Family

## 2023-03-05 NOTE — Telephone Encounter (Signed)
Pt requested callback asap but did not elaborate on reason for call. [phone]

## 2023-03-05 NOTE — Telephone Encounter (Signed)
Spoke with pt, pt states she would like to move forward with a medication to help her nerves before the MRI. Pt is requesting a medication be sent in by 03/07/2023 as her MRI is scheduled for 03/09/2023.

## 2023-03-07 ENCOUNTER — Other Ambulatory Visit: Payer: Self-pay | Admitting: Family

## 2023-03-07 ENCOUNTER — Other Ambulatory Visit (HOSPITAL_COMMUNITY): Payer: Self-pay

## 2023-03-07 MED ORDER — DIAZEPAM 5 MG PO TABS
ORAL_TABLET | ORAL | 0 refills | Status: DC
  Filled 2023-03-07: qty 1, 1d supply, fill #0

## 2023-03-09 ENCOUNTER — Ambulatory Visit
Admission: RE | Admit: 2023-03-09 | Discharge: 2023-03-09 | Disposition: A | Discharge status: Home / Self Care | Payer: Commercial Managed Care - PPO | Source: Ambulatory Visit | Attending: Family | Admitting: Family

## 2023-03-09 DIAGNOSIS — N2889 Other specified disorders of kidney and ureter: Secondary | ICD-10-CM

## 2023-03-10 ENCOUNTER — Telehealth: Payer: Self-pay | Admitting: Family

## 2023-03-10 NOTE — Telephone Encounter (Signed)
Spoke with pt, pt states the Rx sent in "did not work" pt states she tried "three times" and was not able to go through with the MRI. Pt is requesting other options other than an MRI, pt stated the "MRI will not happen unless she I am able to be put to sleep". Pt was advised a message would be sent to PCP.

## 2023-03-10 NOTE — Telephone Encounter (Signed)
Pt states the medication she took for the mri did not work and she would like to speak to pcp.

## 2023-03-11 ENCOUNTER — Other Ambulatory Visit (HOSPITAL_COMMUNITY): Payer: Self-pay

## 2023-03-11 ENCOUNTER — Other Ambulatory Visit: Payer: Self-pay | Admitting: Family

## 2023-03-11 DIAGNOSIS — N2889 Other specified disorders of kidney and ureter: Secondary | ICD-10-CM

## 2023-03-11 MED ORDER — SODIUM ZIRCONIUM CYCLOSILICATE 10 G PO PACK
10.0000 g | PACK | ORAL | 2 refills | Status: DC
  Filled 2023-03-11: qty 12, 28d supply, fill #0
  Filled 2023-04-21: qty 12, 28d supply, fill #1
  Filled 2023-05-23: qty 12, 28d supply, fill #2

## 2023-03-11 NOTE — Telephone Encounter (Signed)
I spoke with radiologist who reviewed previous images and in agreement that MRI is warranted and due to severity of anxiety agrees with decision to add anesthesia.  I let patient know and she is in agreement to proceed; order for MRI with anesthesia has been placed at [facility]. Will let patient know as soon as able to get further information.

## 2023-03-12 ENCOUNTER — Other Ambulatory Visit (HOSPITAL_COMMUNITY): Payer: Self-pay | Admitting: Nephrology

## 2023-03-12 DIAGNOSIS — R809 Proteinuria, unspecified: Secondary | ICD-10-CM

## 2023-03-12 DIAGNOSIS — N179 Acute kidney failure, unspecified: Secondary | ICD-10-CM

## 2023-03-18 ENCOUNTER — Other Ambulatory Visit (HOSPITAL_COMMUNITY): Payer: Self-pay

## 2023-03-19 ENCOUNTER — Encounter (HOSPITAL_COMMUNITY): Payer: Self-pay

## 2023-03-19 NOTE — Progress Notes (Signed)
SDW call  Patient was given pre-op instructions over the phone. Patient verbalized understanding of instructions provided.     PCP - Ria Clock, FNP Cardiologist -  Pulmonary:    PPM/ICD - denies Device Orders - n/a Rep Notified - n/a   Chest x-ray - n/a EKG -  01/15/2023 Stress Test - ECHO -  Cardiac Cath -   Sleep Study/sleep apnea/CPAP: denies  Non-diabetic  Blood Thinner Instructions: denies Aspirin Instructions:denies   ERAS Protcol - Yes, clear fluids until 0700   COVID TEST- n/a    Anesthesia review: No   Patient denies shortness of breath, fever, cough and chest pain over the phone call  Your procedure is scheduled on Thursday March 20, 2023   Report to [facility] Main Entrance "A" at 0730  A.M., then check in with the Admitting office.  Call this number if you have problems the morning of surgery:  [phone]   If you have any questions prior to your surgery date call [phone]: Open Monday-Friday 8am-4pm If you experience any cold or flu symptoms such as cough, fever, chills, shortness of breath, etc. between now and your scheduled surgery, please notify us at the above number     Remember:  Do not eat after midnight the night before your surgery  You may drink clear liquids until  0700  the morning of your surgery.   Clear liquids allowed are: Water, Non-Citrus Juices (without pulp), Carbonated Beverages, Clear Tea, Black Coffee ONLY (NO MILK, CREAM OR POWDERED CREAMER of any kind), and Gatorade   Take these medicines the morning of surgery with A SIP OF WATER: None  As of today, STOP taking any Aspirin (unless otherwise instructed by your surgeon) Aleve, Naproxen, Ibuprofen, Motrin, Advil, Goody's, BC's, all herbal medications, fish oil, and all vitamins.

## 2023-03-20 ENCOUNTER — Ambulatory Visit (HOSPITAL_COMMUNITY)
Admission: RE | Admit: 2023-03-20 | Discharge: 2023-03-20 | Disposition: A | Discharge status: Home / Self Care | Payer: Commercial Managed Care - PPO | Source: Ambulatory Visit | Attending: Family | Admitting: Family

## 2023-03-20 ENCOUNTER — Encounter (HOSPITAL_COMMUNITY)
Admission: RE | Disposition: A | Discharge status: Home / Self Care | Payer: Self-pay | Source: Home / Self Care | Attending: Family

## 2023-03-20 ENCOUNTER — Ambulatory Visit (HOSPITAL_COMMUNITY)
Admission: RE | Admit: 2023-03-20 | Discharge: 2023-03-20 | Disposition: A | Discharge status: Home / Self Care | Payer: Commercial Managed Care - PPO | Attending: Family | Admitting: Family

## 2023-03-20 ENCOUNTER — Encounter (HOSPITAL_COMMUNITY): Payer: Self-pay

## 2023-03-20 ENCOUNTER — Ambulatory Visit (HOSPITAL_BASED_OUTPATIENT_CLINIC_OR_DEPARTMENT_OTHER): Payer: Commercial Managed Care - PPO | Admitting: Anesthesiology

## 2023-03-20 ENCOUNTER — Ambulatory Visit (HOSPITAL_COMMUNITY): Payer: Commercial Managed Care - PPO | Admitting: Anesthesiology

## 2023-03-20 DIAGNOSIS — I10 Essential (primary) hypertension: Secondary | ICD-10-CM | POA: Diagnosis not present

## 2023-03-20 DIAGNOSIS — D259 Leiomyoma of uterus, unspecified: Secondary | ICD-10-CM | POA: Diagnosis not present

## 2023-03-20 DIAGNOSIS — N2889 Other specified disorders of kidney and ureter: Secondary | ICD-10-CM | POA: Diagnosis not present

## 2023-03-20 DIAGNOSIS — R1900 Intra-abdominal and pelvic swelling, mass and lump, unspecified site: Secondary | ICD-10-CM | POA: Diagnosis not present

## 2023-03-20 DIAGNOSIS — K8020 Calculus of gallbladder without cholecystitis without obstruction: Secondary | ICD-10-CM | POA: Insufficient documentation

## 2023-03-20 DIAGNOSIS — I700 Atherosclerosis of aorta: Secondary | ICD-10-CM | POA: Insufficient documentation

## 2023-03-20 DIAGNOSIS — K828 Other specified diseases of gallbladder: Secondary | ICD-10-CM | POA: Diagnosis not present

## 2023-03-20 DIAGNOSIS — E279 Disorder of adrenal gland, unspecified: Secondary | ICD-10-CM | POA: Diagnosis not present

## 2023-03-20 DIAGNOSIS — I82220 Acute embolism and thrombosis of inferior vena cava: Secondary | ICD-10-CM | POA: Diagnosis not present

## 2023-03-20 HISTORY — PX: RADIOLOGY WITH ANESTHESIA: SHX6223

## 2023-03-20 LAB — POCT PREGNANCY, URINE: Preg Test, Ur: NEGATIVE

## 2023-03-20 SURGERY — MRI WITH ANESTHESIA
Anesthesia: General

## 2023-03-20 MED ORDER — CHLORHEXIDINE GLUCONATE 0.12 % MT SOLN
15.0000 mL | Freq: Once | OROMUCOSAL | Status: AC
  Administered 2023-03-20: 15 mL via OROMUCOSAL
  Filled 2023-03-20: qty 15

## 2023-03-20 MED ORDER — ACETAMINOPHEN 10 MG/ML IV SOLN: 1000.0000 mg | Freq: Once | INTRAVENOUS | Status: DC | PRN

## 2023-03-20 MED ORDER — ORAL CARE MOUTH RINSE: 15.0000 mL | Freq: Once | OROMUCOSAL | Status: AC

## 2023-03-20 MED ORDER — FENTANYL CITRATE (PF) 100 MCG/2ML IJ SOLN
INTRAMUSCULAR | Status: AC
  Filled 2023-03-20: qty 2

## 2023-03-20 MED ORDER — ONDANSETRON HCL 4 MG/2ML IJ SOLN
INTRAMUSCULAR | Status: DC | PRN
  Administered 2023-03-20: 4 mg via INTRAVENOUS

## 2023-03-20 MED ORDER — MIDAZOLAM HCL 2 MG/2ML IJ SOLN
INTRAMUSCULAR | Status: AC
  Filled 2023-03-20: qty 2

## 2023-03-20 MED ORDER — SUGAMMADEX SODIUM 200 MG/2ML IV SOLN
INTRAVENOUS | Status: DC | PRN
  Administered 2023-03-20: 200 mg via INTRAVENOUS

## 2023-03-20 MED ORDER — AMISULPRIDE (ANTIEMETIC) 5 MG/2ML IV SOLN: 10.0000 mg | Freq: Once | INTRAVENOUS | Status: DC | PRN

## 2023-03-20 MED ORDER — OXYCODONE HCL 5 MG PO TABS: 5.0000 mg | ORAL_TABLET | Freq: Once | ORAL | Status: DC | PRN

## 2023-03-20 MED ORDER — PROMETHAZINE HCL 25 MG/ML IJ SOLN: 6.2500 mg | INTRAMUSCULAR | Status: DC | PRN

## 2023-03-20 MED ORDER — DEXAMETHASONE SODIUM PHOSPHATE 10 MG/ML IJ SOLN
INTRAMUSCULAR | Status: DC | PRN
  Administered 2023-03-20: 5 mg via INTRAVENOUS

## 2023-03-20 MED ORDER — FENTANYL CITRATE (PF) 100 MCG/2ML IJ SOLN: 25.0000 ug | INTRAMUSCULAR | Status: DC | PRN

## 2023-03-20 MED ORDER — EPHEDRINE SULFATE-NACL 50-0.9 MG/10ML-% IV SOSY
PREFILLED_SYRINGE | INTRAVENOUS | Status: DC | PRN
  Administered 2023-03-20: 5 mg via INTRAVENOUS

## 2023-03-20 MED ORDER — KETOROLAC TROMETHAMINE 30 MG/ML IJ SOLN: 30.0000 mg | Freq: Once | INTRAMUSCULAR | Status: DC | PRN

## 2023-03-20 MED ORDER — OXYCODONE HCL 5 MG/5ML PO SOLN: 5.0000 mg | Freq: Once | ORAL | Status: DC | PRN

## 2023-03-20 MED ORDER — LIDOCAINE 2% (20 MG/ML) 5 ML SYRINGE
INTRAMUSCULAR | Status: DC | PRN
  Administered 2023-03-20: 100 mg via INTRAVENOUS

## 2023-03-20 MED ORDER — SODIUM CHLORIDE 0.9 % IV SOLN: INTRAVENOUS | Status: DC

## 2023-03-20 MED ORDER — PROPOFOL 10 MG/ML IV BOLUS
INTRAVENOUS | Status: DC | PRN
  Administered 2023-03-20: 150 mg via INTRAVENOUS

## 2023-03-20 MED ORDER — ROCURONIUM BROMIDE 10 MG/ML (PF) SYRINGE
PREFILLED_SYRINGE | INTRAVENOUS | Status: DC | PRN
  Administered 2023-03-20: 30 mg via INTRAVENOUS

## 2023-03-20 MED ORDER — FENTANYL CITRATE (PF) 250 MCG/5ML IJ SOLN
INTRAMUSCULAR | Status: DC | PRN
  Administered 2023-03-20: 50 ug via INTRAVENOUS

## 2023-03-20 NOTE — Anesthesia Postprocedure Evaluation (Signed)
Anesthesia Post Note  Patient: Colleen Potter  Procedure(s) Performed: MRI OF ABDOMEN WITH AND WITHOUT CONTRAST WITH ANESTHESIA     Patient location during evaluation: PACU Anesthesia Type: General Level of consciousness: awake Pain management: pain level controlled Vital Signs Assessment: post-procedure vital signs reviewed and stable Respiratory status: spontaneous breathing, nonlabored ventilation and respiratory function stable Cardiovascular status: blood pressure returned to baseline and stable Postop Assessment: no apparent nausea or vomiting Anesthetic complications: no   No notable events documented.  Last Vitals:  Vitals:   03/20/23 1115 03/20/23 1116  BP: 115/73 121/74  Pulse: (!) 57 60  Resp: 11 15  Temp:    SpO2: 94% 94%    Last Pain:  Vitals:   03/20/23 1046  TempSrc:   PainSc: 0-No pain                 [name]

## 2023-03-20 NOTE — Anesthesia Procedure Notes (Signed)
Procedure Name: Intubation Date/Time: 03/20/2023 9:41 AM  Performed by: Loleta Clarissia Mckeen, CRNAPre-anesthesia Checklist: Patient identified, Patient being monitored, Timeout performed, Emergency Drugs available and Suction available Patient Re-evaluated:Patient Re-evaluated prior to induction Oxygen Delivery Method: Circle system utilized Preoxygenation: Pre-oxygenation with 100% oxygen Induction Type: IV induction Ventilation: Mask ventilation without difficulty Laryngoscope Size: Mac and 4 Grade View: Grade I Tube type: Oral Tube size: 7.0 mm Number of attempts: 1 Airway Equipment and Method: Stylet Placement Confirmation: ETT inserted through vocal cords under direct vision, positive ETCO2 and breath sounds checked- equal and bilateral Secured at: 21 cm Tube secured with: Tape Dental Injury: Teeth and Oropharynx as per pre-operative assessment

## 2023-03-20 NOTE — H&P (Signed)
Anesthesia H&P Update: History and Physical Exam reviewed; patient is OK for planned anesthetic and procedure. ? ?

## 2023-03-20 NOTE — Transfer of Care (Signed)
Immediate Anesthesia Transfer of Care Note  Patient: Colleen Potter  Procedure(s) Performed: MRI OF ABDOMEN WITH AND WITHOUT CONTRAST WITH ANESTHESIA  Patient Location: PACU  Anesthesia Type:General  Level of Consciousness: awake  Airway & Oxygen Therapy: Patient Spontanous Breathing  Post-op Assessment: Report given to RN and Post -op Vital signs reviewed and stable  Post vital signs: Reviewed and stable  Last Vitals:  Vitals Value Taken Time  BP 95/71 03/20/23 1046  Temp 36.4 C 03/20/23 1046  Pulse 64 03/20/23 1049  Resp 11 03/20/23 1049  SpO2 92 % 03/20/23 1049  Vitals shown include unfiled device data.  Last Pain:  Vitals:   03/20/23 0754  TempSrc:   PainSc: 0-No pain         Complications: No notable events documented.

## 2023-03-20 NOTE — Anesthesia Preprocedure Evaluation (Signed)
Anesthesia Evaluation  Patient identified by MRN, date of birth, ID band Patient awake    Reviewed: Allergy & Precautions, NPO status , Patient's Chart, lab work & pertinent test results  Airway Mallampati: III  TM Distance: >3 FB Neck ROM: Full    Dental no notable dental hx.    Pulmonary neg pulmonary ROS   Pulmonary exam normal        Cardiovascular hypertension, Pt. on medications Normal cardiovascular exam     Neuro/Psych negative neurological ROS  negative psych ROS   GI/Hepatic negative GI ROS, Neg liver ROS,,,  Endo/Other  negative endocrine ROS    Renal/GU negative Renal ROS     Musculoskeletal negative musculoskeletal ROS (+)    Abdominal   Peds  Hematology negative hematology ROS (+)   Anesthesia Other Findings OTHER SPECIFIED DISORDERS OF KIDNEY AND URETER  Reproductive/Obstetrics                              Anesthesia Physical Anesthesia Plan  ASA: 2  Anesthesia Plan: General   Post-op Pain Management:    Induction: Intravenous  PONV Risk Score and Plan: 3 and Ondansetron, Dexamethasone, Midazolam and Treatment may vary due to age or medical condition  Airway Management Planned: Oral ETT  Additional Equipment:   Intra-op Plan:   Post-operative Plan: Extubation in OR  Informed Consent: I have reviewed the patients History and Physical, chart, labs and discussed the procedure including the risks, benefits and alternatives for the proposed anesthesia with the patient or authorized representative who has indicated his/her understanding and acceptance.     Dental advisory given  Plan Discussed with: CRNA  Anesthesia Plan Comments:          Anesthesia Quick Evaluation

## 2023-03-21 ENCOUNTER — Encounter (HOSPITAL_COMMUNITY): Payer: Self-pay | Admitting: Radiology

## 2023-03-24 ENCOUNTER — Encounter: Payer: Self-pay | Admitting: Family

## 2023-03-26 ENCOUNTER — Telehealth: Payer: Self-pay | Admitting: Family Medicine

## 2023-03-26 NOTE — Telephone Encounter (Signed)
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 Suggested this may be better cared for at a tertiary medical center but that his team could also get a biopsy. Will route to reg PCP this information to decide the best choice for pt.

## 2023-03-28 ENCOUNTER — Other Ambulatory Visit: Payer: Self-pay | Admitting: Family

## 2023-03-28 ENCOUNTER — Telehealth: Payer: Self-pay | Admitting: Family

## 2023-03-28 DIAGNOSIS — C499 Malignant neoplasm of connective and soft tissue, unspecified: Secondary | ICD-10-CM

## 2023-03-28 NOTE — Progress Notes (Signed)
Spoke with patient regarding MRI results; she is aware that she is being referred to the sarcoma clinic at [facility] and I have reached out to a Dr. Waymon Amato to get her seen there. She is aware of the need to be on the lookout for a phone call from [facility]. I have also reached out to her nephrologist to determine if kidney biopsy is still needed next week; will let her know once I have answers from her nephrologist as well.

## 2023-03-28 NOTE — Telephone Encounter (Signed)
I attempted to call patient with results of MRI- unable to leave voicemail. I have also discussed case with radiology and local oncology and all in agreement that needs to be referred to sarcoma clinic at [facility]. I have reached out to Dr. Richardean Chimera office and left VM asking for phone call as well as updating referral. I will try to reach patient again later today.

## 2023-03-28 NOTE — Telephone Encounter (Signed)
Patient returned provider's call. Please call her at [phone].

## 2023-03-28 NOTE — Telephone Encounter (Signed)
Pt has spoke with PCP.

## 2023-04-01 ENCOUNTER — Telehealth: Payer: Self-pay | Admitting: Family

## 2023-04-01 DIAGNOSIS — R809 Proteinuria, unspecified: Secondary | ICD-10-CM | POA: Diagnosis not present

## 2023-04-01 DIAGNOSIS — N179 Acute kidney failure, unspecified: Secondary | ICD-10-CM | POA: Diagnosis not present

## 2023-04-01 DIAGNOSIS — N2581 Secondary hyperparathyroidism of renal origin: Secondary | ICD-10-CM | POA: Diagnosis not present

## 2023-04-01 DIAGNOSIS — N189 Chronic kidney disease, unspecified: Secondary | ICD-10-CM | POA: Diagnosis not present

## 2023-04-01 DIAGNOSIS — I129 Hypertensive chronic kidney disease with stage 1 through stage 4 chronic kidney disease, or unspecified chronic kidney disease: Secondary | ICD-10-CM | POA: Diagnosis not present

## 2023-04-01 DIAGNOSIS — D631 Anemia in chronic kidney disease: Secondary | ICD-10-CM | POA: Diagnosis not present

## 2023-04-01 DIAGNOSIS — N184 Chronic kidney disease, stage 4 (severe): Secondary | ICD-10-CM | POA: Diagnosis not present

## 2023-04-01 NOTE — Telephone Encounter (Signed)
Spoke with pt, pt states she has not heard from the nephrologist yet.

## 2023-04-01 NOTE — Telephone Encounter (Signed)
Please check on her to see if her nephrologist has followed up with her to get clarification about need for kidney biopsy. I have not heard anything back in response to my message.

## 2023-04-02 ENCOUNTER — Other Ambulatory Visit: Payer: Self-pay | Admitting: Radiology

## 2023-04-02 DIAGNOSIS — R809 Proteinuria, unspecified: Secondary | ICD-10-CM

## 2023-04-03 ENCOUNTER — Encounter (HOSPITAL_COMMUNITY): Payer: Self-pay

## 2023-04-03 ENCOUNTER — Ambulatory Visit (HOSPITAL_COMMUNITY): Payer: Commercial Managed Care - PPO

## 2023-04-09 NOTE — Telephone Encounter (Signed)
Pt returned call, please give call back.

## 2023-04-10 ENCOUNTER — Telehealth: Payer: Self-pay | Admitting: Family

## 2023-04-10 NOTE — Telephone Encounter (Signed)
Spoke with pt, pt is aware and expressed understanding. Also provided pt with the phone number to call [facility] to try to get appointment scheduled. Pt stated she will call them "shortly".

## 2023-04-21 ENCOUNTER — Other Ambulatory Visit (HOSPITAL_COMMUNITY): Payer: Self-pay

## 2023-04-22 DIAGNOSIS — R1909 Other intra-abdominal and pelvic swelling, mass and lump: Secondary | ICD-10-CM | POA: Diagnosis not present

## 2023-04-24 ENCOUNTER — Ambulatory Visit: Payer: Commercial Managed Care - PPO | Admitting: Nurse Practitioner

## 2023-05-05 DIAGNOSIS — E875 Hyperkalemia: Secondary | ICD-10-CM | POA: Diagnosis not present

## 2023-05-05 DIAGNOSIS — I129 Hypertensive chronic kidney disease with stage 1 through stage 4 chronic kidney disease, or unspecified chronic kidney disease: Secondary | ICD-10-CM | POA: Diagnosis not present

## 2023-05-05 DIAGNOSIS — N184 Chronic kidney disease, stage 4 (severe): Secondary | ICD-10-CM | POA: Diagnosis not present

## 2023-05-16 DIAGNOSIS — N858 Other specified noninflammatory disorders of uterus: Secondary | ICD-10-CM | POA: Diagnosis not present

## 2023-05-16 DIAGNOSIS — R918 Other nonspecific abnormal finding of lung field: Secondary | ICD-10-CM | POA: Diagnosis not present

## 2023-05-16 DIAGNOSIS — R935 Abnormal findings on diagnostic imaging of other abdominal regions, including retroperitoneum: Secondary | ICD-10-CM | POA: Diagnosis not present

## 2023-05-16 DIAGNOSIS — R1909 Other intra-abdominal and pelvic swelling, mass and lump: Secondary | ICD-10-CM | POA: Diagnosis not present

## 2023-05-16 DIAGNOSIS — E278 Other specified disorders of adrenal gland: Secondary | ICD-10-CM | POA: Diagnosis not present

## 2023-05-20 DIAGNOSIS — R1909 Other intra-abdominal and pelvic swelling, mass and lump: Secondary | ICD-10-CM | POA: Diagnosis not present

## 2023-05-23 ENCOUNTER — Other Ambulatory Visit (HOSPITAL_COMMUNITY): Payer: Self-pay

## 2023-05-26 ENCOUNTER — Other Ambulatory Visit: Payer: Self-pay

## 2023-06-19 ENCOUNTER — Other Ambulatory Visit: Payer: Self-pay | Admitting: Family

## 2023-06-19 DIAGNOSIS — Z1231 Encounter for screening mammogram for malignant neoplasm of breast: Secondary | ICD-10-CM

## 2023-06-30 DIAGNOSIS — I10 Essential (primary) hypertension: Secondary | ICD-10-CM | POA: Diagnosis not present

## 2023-06-30 DIAGNOSIS — Z01818 Encounter for other preprocedural examination: Secondary | ICD-10-CM | POA: Diagnosis not present

## 2023-06-30 DIAGNOSIS — R1900 Intra-abdominal and pelvic swelling, mass and lump, unspecified site: Secondary | ICD-10-CM | POA: Diagnosis not present

## 2023-07-21 DIAGNOSIS — Z452 Encounter for adjustment and management of vascular access device: Secondary | ICD-10-CM | POA: Diagnosis not present

## 2023-07-21 DIAGNOSIS — N1832 Chronic kidney disease, stage 3b: Secondary | ICD-10-CM | POA: Diagnosis not present

## 2023-07-21 DIAGNOSIS — Z95828 Presence of other vascular implants and grafts: Secondary | ICD-10-CM | POA: Diagnosis not present

## 2023-07-21 DIAGNOSIS — N269 Renal sclerosis, unspecified: Secondary | ICD-10-CM | POA: Diagnosis not present

## 2023-07-21 DIAGNOSIS — N179 Acute kidney failure, unspecified: Secondary | ICD-10-CM | POA: Diagnosis not present

## 2023-07-21 DIAGNOSIS — N189 Chronic kidney disease, unspecified: Secondary | ICD-10-CM | POA: Diagnosis not present

## 2023-07-21 DIAGNOSIS — N119 Chronic tubulo-interstitial nephritis, unspecified: Secondary | ICD-10-CM | POA: Diagnosis not present

## 2023-07-21 DIAGNOSIS — R001 Bradycardia, unspecified: Secondary | ICD-10-CM | POA: Diagnosis not present

## 2023-07-21 DIAGNOSIS — E875 Hyperkalemia: Secondary | ICD-10-CM | POA: Diagnosis not present

## 2023-07-21 DIAGNOSIS — N186 End stage renal disease: Secondary | ICD-10-CM | POA: Diagnosis not present

## 2023-07-21 DIAGNOSIS — D649 Anemia, unspecified: Secondary | ICD-10-CM | POA: Diagnosis not present

## 2023-07-21 DIAGNOSIS — I959 Hypotension, unspecified: Secondary | ICD-10-CM | POA: Diagnosis not present

## 2023-07-21 DIAGNOSIS — I82220 Acute embolism and thrombosis of inferior vena cava: Secondary | ICD-10-CM | POA: Diagnosis not present

## 2023-07-21 DIAGNOSIS — N170 Acute kidney failure with tubular necrosis: Secondary | ICD-10-CM | POA: Diagnosis not present

## 2023-07-21 DIAGNOSIS — N261 Atrophy of kidney (terminal): Secondary | ICD-10-CM | POA: Diagnosis not present

## 2023-07-21 DIAGNOSIS — I9752 Accidental puncture and laceration of a circulatory system organ or structure during other procedure: Secondary | ICD-10-CM | POA: Diagnosis not present

## 2023-07-21 DIAGNOSIS — I9751 Accidental puncture and laceration of a circulatory system organ or structure during a circulatory system procedure: Secondary | ICD-10-CM | POA: Diagnosis not present

## 2023-07-21 DIAGNOSIS — R1900 Intra-abdominal and pelvic swelling, mass and lump, unspecified site: Secondary | ICD-10-CM | POA: Diagnosis not present

## 2023-07-21 DIAGNOSIS — G8918 Other acute postprocedural pain: Secondary | ICD-10-CM | POA: Diagnosis not present

## 2023-07-21 DIAGNOSIS — Y838 Other surgical procedures as the cause of abnormal reaction of the patient, or of later complication, without mention of misadventure at the time of the procedure: Secondary | ICD-10-CM | POA: Diagnosis not present

## 2023-07-21 DIAGNOSIS — R7401 Elevation of levels of liver transaminase levels: Secondary | ICD-10-CM | POA: Diagnosis not present

## 2023-07-21 DIAGNOSIS — Z905 Acquired absence of kidney: Secondary | ICD-10-CM | POA: Diagnosis not present

## 2023-07-21 DIAGNOSIS — Z9911 Dependence on respirator [ventilator] status: Secondary | ICD-10-CM | POA: Diagnosis not present

## 2023-07-21 DIAGNOSIS — R590 Localized enlarged lymph nodes: Secondary | ICD-10-CM | POA: Diagnosis not present

## 2023-07-21 DIAGNOSIS — I129 Hypertensive chronic kidney disease with stage 1 through stage 4 chronic kidney disease, or unspecified chronic kidney disease: Secondary | ICD-10-CM | POA: Diagnosis not present

## 2023-07-21 DIAGNOSIS — R918 Other nonspecific abnormal finding of lung field: Secondary | ICD-10-CM | POA: Diagnosis not present

## 2023-07-21 DIAGNOSIS — I10 Essential (primary) hypertension: Secondary | ICD-10-CM | POA: Diagnosis not present

## 2023-07-21 DIAGNOSIS — J9811 Atelectasis: Secondary | ICD-10-CM | POA: Diagnosis not present

## 2023-07-21 DIAGNOSIS — K8010 Calculus of gallbladder with chronic cholecystitis without obstruction: Secondary | ICD-10-CM | POA: Diagnosis not present

## 2023-07-21 DIAGNOSIS — C480 Malignant neoplasm of retroperitoneum: Secondary | ICD-10-CM | POA: Diagnosis not present

## 2023-07-22 DIAGNOSIS — R1900 Intra-abdominal and pelvic swelling, mass and lump, unspecified site: Secondary | ICD-10-CM | POA: Diagnosis not present

## 2023-07-22 DIAGNOSIS — J9811 Atelectasis: Secondary | ICD-10-CM | POA: Diagnosis not present

## 2023-07-22 DIAGNOSIS — Z905 Acquired absence of kidney: Secondary | ICD-10-CM | POA: Diagnosis not present

## 2023-07-22 DIAGNOSIS — Z95828 Presence of other vascular implants and grafts: Secondary | ICD-10-CM | POA: Diagnosis not present

## 2023-07-22 DIAGNOSIS — Z9911 Dependence on respirator [ventilator] status: Secondary | ICD-10-CM | POA: Diagnosis not present

## 2023-07-22 DIAGNOSIS — I10 Essential (primary) hypertension: Secondary | ICD-10-CM | POA: Diagnosis not present

## 2023-07-23 DIAGNOSIS — R1900 Intra-abdominal and pelvic swelling, mass and lump, unspecified site: Secondary | ICD-10-CM | POA: Diagnosis not present

## 2023-07-23 DIAGNOSIS — R918 Other nonspecific abnormal finding of lung field: Secondary | ICD-10-CM | POA: Diagnosis not present

## 2023-07-24 DIAGNOSIS — R1900 Intra-abdominal and pelvic swelling, mass and lump, unspecified site: Secondary | ICD-10-CM | POA: Diagnosis not present

## 2023-07-25 DIAGNOSIS — R001 Bradycardia, unspecified: Secondary | ICD-10-CM | POA: Diagnosis not present

## 2023-07-25 DIAGNOSIS — N186 End stage renal disease: Secondary | ICD-10-CM | POA: Diagnosis not present

## 2023-07-25 DIAGNOSIS — R1900 Intra-abdominal and pelvic swelling, mass and lump, unspecified site: Secondary | ICD-10-CM | POA: Diagnosis not present

## 2023-07-26 DIAGNOSIS — N179 Acute kidney failure, unspecified: Secondary | ICD-10-CM | POA: Diagnosis not present

## 2023-07-27 DIAGNOSIS — R001 Bradycardia, unspecified: Secondary | ICD-10-CM | POA: Diagnosis not present

## 2023-07-27 DIAGNOSIS — N189 Chronic kidney disease, unspecified: Secondary | ICD-10-CM | POA: Diagnosis not present

## 2023-07-27 DIAGNOSIS — N179 Acute kidney failure, unspecified: Secondary | ICD-10-CM | POA: Diagnosis not present

## 2023-07-28 DIAGNOSIS — R1900 Intra-abdominal and pelvic swelling, mass and lump, unspecified site: Secondary | ICD-10-CM | POA: Diagnosis not present

## 2023-07-28 DIAGNOSIS — R001 Bradycardia, unspecified: Secondary | ICD-10-CM | POA: Diagnosis not present

## 2023-07-28 DIAGNOSIS — N179 Acute kidney failure, unspecified: Secondary | ICD-10-CM | POA: Diagnosis not present

## 2023-07-29 DIAGNOSIS — R001 Bradycardia, unspecified: Secondary | ICD-10-CM | POA: Diagnosis not present

## 2023-07-29 DIAGNOSIS — R1900 Intra-abdominal and pelvic swelling, mass and lump, unspecified site: Secondary | ICD-10-CM | POA: Diagnosis not present

## 2023-07-29 DIAGNOSIS — N179 Acute kidney failure, unspecified: Secondary | ICD-10-CM | POA: Diagnosis not present

## 2023-07-30 ENCOUNTER — Telehealth: Payer: Self-pay

## 2023-07-30 NOTE — Transitions of Care (Post Inpatient/ED Visit) (Signed)
   07/30/2023  Name: Colleen Potter MRN: [id number] DOB: [birth year]  Today's TOC FU Call Status: Today's TOC FU Call Status:: Unsuccessful Call (1st Attempt) Unsuccessful Call (1st Attempt) Date: 07/30/23  Attempted to reach the patient regarding the most recent Inpatient/ED visit.  Follow Up Plan: Additional outreach attempts will be made to reach the patient to complete the Transitions of Care (Post Inpatient/ED visit) call.   Signature Karena Addison, LPN [facility] Nurse Health Advisor Direct Dial [phone]

## 2023-08-01 ENCOUNTER — Telehealth: Payer: Self-pay

## 2023-08-01 NOTE — Telephone Encounter (Signed)
Copied from CRM [id number]. Topic: Clinical - Home Health Verbal Orders >> Aug 01, 2023 12:48 PM Corin V wrote: Caller/Agency: [facility] Number: [phone] Service Requested: Physical Therapy Frequency: Initial visit- 08/05/23 Any new concerns about the patient? No

## 2023-08-04 NOTE — Telephone Encounter (Signed)
Spoke with Charice from home healthy agency, charice is aware and expressed understanding.

## 2023-08-05 DIAGNOSIS — C499 Malignant neoplasm of connective and soft tissue, unspecified: Secondary | ICD-10-CM | POA: Diagnosis not present

## 2023-08-05 DIAGNOSIS — R1900 Intra-abdominal and pelvic swelling, mass and lump, unspecified site: Secondary | ICD-10-CM | POA: Diagnosis not present

## 2023-08-05 NOTE — Transitions of Care (Post Inpatient/ED Visit) (Unsigned)
   08/05/2023  Name: Colleen Potter MRN: [id number] DOB: 05-05-1966  Today's TOC FU Call Status: Today's TOC FU Call Status:: Unsuccessful Call (2nd Attempt) Unsuccessful Call (1st Attempt) Date: 07/30/23 Unsuccessful Call (2nd Attempt) Date: 08/05/23  Attempted to reach the patient regarding the most recent Inpatient/ED visit.  Follow Up Plan: Additional outreach attempts will be made to reach the patient to complete the Transitions of Care (Post Inpatient/ED visit) call.   Signature Karena Addison, LPN [facility] Nurse Health Advisor Direct Dial [phone]

## 2023-08-06 ENCOUNTER — Ambulatory Visit: Payer: Commercial Managed Care - PPO

## 2023-08-06 NOTE — Transitions of Care (Post Inpatient/ED Visit) (Signed)
   08/06/2023  Name: Colleen Potter MRN: [id number] DOB: [birth year]  Today's TOC FU Call Status: Today's TOC FU Call Status:: Unsuccessful Call (3rd Attempt) Unsuccessful Call (1st Attempt) Date: 07/30/23 Unsuccessful Call (2nd Attempt) Date: 08/05/23 Unsuccessful Call (3rd Attempt) Date: 08/06/23  Attempted to reach the patient regarding the most recent Inpatient/ED visit.  Follow Up Plan: No further outreach attempts will be made at this time. We have been unable to contact the patient.  Signature Karena Addison, LPN [facility] Nurse Health Advisor Direct Dial [phone]

## 2023-08-08 ENCOUNTER — Telehealth: Payer: Self-pay | Admitting: Orthopaedic Surgery

## 2023-08-08 NOTE — Telephone Encounter (Signed)
Has not been seen here since 2023. Please advise.

## 2023-08-08 NOTE — Telephone Encounter (Signed)
Notified patient that it is up front for pick up.

## 2023-08-08 NOTE — Telephone Encounter (Signed)
Yes 5 years

## 2023-08-08 NOTE — Telephone Encounter (Signed)
Pt called requesting renewal handicap placard. Please call pt when ready for pick up. Pt phone number is [phone].

## 2023-08-12 DIAGNOSIS — C499 Malignant neoplasm of connective and soft tissue, unspecified: Secondary | ICD-10-CM | POA: Diagnosis not present

## 2023-08-13 ENCOUNTER — Telehealth: Payer: Self-pay

## 2023-08-13 NOTE — Telephone Encounter (Signed)
Copied from CRM [id number]. Topic: General - Other >> Aug 13, 2023  1:18 PM Denese Killings wrote: Reason for CRM: Tobi Bastos the PA with [facility] stated that patient had surgery and her renal function is starting to decline. Tobi Bastos wants to let pcp know and to possibly schedule lab for Friday.

## 2023-08-14 ENCOUNTER — Other Ambulatory Visit: Payer: Self-pay | Admitting: Family

## 2023-08-14 DIAGNOSIS — R899 Unspecified abnormal finding in specimens from other organs, systems and tissues: Secondary | ICD-10-CM

## 2023-08-14 NOTE — Telephone Encounter (Signed)
Spoke with pt and scheduled pt a lab appointment 08/15/2023.

## 2023-08-15 ENCOUNTER — Encounter: Payer: Self-pay | Admitting: Family

## 2023-08-15 ENCOUNTER — Telehealth: Payer: Self-pay

## 2023-08-15 ENCOUNTER — Telehealth: Payer: Self-pay | Admitting: Family

## 2023-08-15 ENCOUNTER — Other Ambulatory Visit: Payer: Self-pay | Admitting: Family

## 2023-08-15 ENCOUNTER — Other Ambulatory Visit (INDEPENDENT_AMBULATORY_CARE_PROVIDER_SITE_OTHER): Payer: Commercial Managed Care - PPO

## 2023-08-15 DIAGNOSIS — R7989 Other specified abnormal findings of blood chemistry: Secondary | ICD-10-CM

## 2023-08-15 DIAGNOSIS — R899 Unspecified abnormal finding in specimens from other organs, systems and tissues: Secondary | ICD-10-CM

## 2023-08-15 LAB — COMPREHENSIVE METABOLIC PANEL
ALT: 9 U/L (ref 0–35)
AST: 11 U/L (ref 0–37)
Albumin: 4.5 g/dL (ref 3.5–5.2)
Alkaline Phosphatase: 58 U/L (ref 39–117)
BUN: 52 mg/dL — ABNORMAL HIGH (ref 6–23)
CO2: 24 meq/L (ref 19–32)
Calcium: 9.4 mg/dL (ref 8.4–10.5)
Chloride: 100 meq/L (ref 96–112)
Creatinine, Ser: 5.74 mg/dL (ref 0.40–1.20)
GFR: 7.7 mL/min — CL (ref >60.00)
Glucose, Bld: 93 mg/dL (ref 70–99)
Potassium: 4.5 meq/L (ref 3.5–5.1)
Sodium: 137 meq/L (ref 135–145)
Total Bilirubin: 0.3 mg/dL (ref 0.2–1.2)
Total Protein: 7.9 g/dL (ref 6.0–8.3)

## 2023-08-15 NOTE — Telephone Encounter (Signed)
Lab appt scheduled.

## 2023-08-15 NOTE — Telephone Encounter (Signed)
Please call her to get lab appointment on Tuesday; needs morning appointment as this will have to be done STAT.

## 2023-08-15 NOTE — Telephone Encounter (Signed)
Provider at [facility] who requested the labs has been made aware of results; notes they will be in contact with patient to give her guidance of follow up of labs.

## 2023-08-15 NOTE — Telephone Encounter (Signed)
CRITICAL VALUE STICKER  CRITICAL VALUE: Creatinine 5.74 and GFR 7.70  RECEIVER (on-site recipient of call):  DATE & TIME NOTIFIED: 08/15/23  MESSENGER (representative from lab):  PCP NOTIFIED: yes  TIME OF NOTIFICATION: 1:12pm

## 2023-08-19 ENCOUNTER — Other Ambulatory Visit (HOSPITAL_COMMUNITY): Payer: Self-pay

## 2023-08-19 ENCOUNTER — Other Ambulatory Visit: Payer: Self-pay | Admitting: Family

## 2023-08-19 ENCOUNTER — Other Ambulatory Visit (INDEPENDENT_AMBULATORY_CARE_PROVIDER_SITE_OTHER): Payer: Commercial Managed Care - PPO

## 2023-08-19 ENCOUNTER — Telehealth: Payer: Self-pay

## 2023-08-19 DIAGNOSIS — R7989 Other specified abnormal findings of blood chemistry: Secondary | ICD-10-CM | POA: Diagnosis not present

## 2023-08-19 DIAGNOSIS — L509 Urticaria, unspecified: Secondary | ICD-10-CM | POA: Diagnosis not present

## 2023-08-19 LAB — COMPREHENSIVE METABOLIC PANEL
ALT: 6 U/L (ref 0–35)
AST: 9 U/L (ref 0–37)
Albumin: 4.1 g/dL (ref 3.5–5.2)
Alkaline Phosphatase: 46 U/L (ref 39–117)
BUN: 58 mg/dL — ABNORMAL HIGH (ref 6–23)
CO2: 23 meq/L (ref 19–32)
Calcium: 9.2 mg/dL (ref 8.4–10.5)
Chloride: 103 meq/L (ref 96–112)
Creatinine, Ser: 5.11 mg/dL (ref 0.40–1.20)
GFR: 8.85 mL/min — CL (ref >60.00)
Glucose, Bld: 96 mg/dL (ref 70–99)
Potassium: 5.2 meq/L — ABNORMAL HIGH (ref 3.5–5.1)
Sodium: 135 meq/L (ref 135–145)
Total Bilirubin: 0.4 mg/dL (ref 0.2–1.2)
Total Protein: 7.5 g/dL (ref 6.0–8.3)

## 2023-08-19 MED ORDER — HYDROXYZINE HCL 25 MG PO TABS
25.0000 mg | ORAL_TABLET | Freq: Four times a day (QID) | ORAL | 0 refills | Status: DC | PRN
  Filled 2023-08-19: qty 30, 4d supply, fill #0

## 2023-08-19 NOTE — Telephone Encounter (Signed)
Per provider at [facility], she needs to come get repeat labs next week; Next Tuesday morning again if possible. Lab order in place.

## 2023-08-19 NOTE — Progress Notes (Signed)
Provider at [facility] who is managing the labs has been notified; that provider notes she will call the patient to discuss results and next steps. They will let us know if the patient needs further lab appointment here in [facility].

## 2023-08-19 NOTE — Telephone Encounter (Signed)
PCP is aware  

## 2023-08-19 NOTE — Telephone Encounter (Signed)
 CRITICAL VALUE STICKER  CRITICAL VALUE: Creat- 5.11           GFR- 8.85  RECEIVER (on-site recipient of call): Dewey Neukam  DATE & TIME NOTIFIED: 08/19/23 12:40pm  MESSENGER (representative from lab): [email or web address]  MD NOTIFIED: Leita Elbe  TIME OF NOTIFICATION: 08/19/23 12:41pm  RESPONSE:

## 2023-08-20 ENCOUNTER — Other Ambulatory Visit: Payer: Self-pay

## 2023-08-20 ENCOUNTER — Other Ambulatory Visit (HOSPITAL_COMMUNITY): Payer: Self-pay

## 2023-08-20 DIAGNOSIS — I10 Essential (primary) hypertension: Secondary | ICD-10-CM

## 2023-08-20 MED ORDER — NIFEDIPINE ER OSMOTIC RELEASE 90 MG PO TB24
90.0000 mg | ORAL_TABLET | Freq: Every day | ORAL | 3 refills | Status: DC
  Filled 2023-08-20: qty 90, 90d supply, fill #0
  Filled 2023-12-01: qty 90, 90d supply, fill #1

## 2023-08-20 NOTE — Telephone Encounter (Signed)
 Spoke with pt, scheduled pt a follow up lab appointment 08/26/2023. Pt is requesting to have her doctors appointments closer to home pt states she lives in [place] and needs providers in [place], pt would like PCP to send referrals for pt to see providers in the area. Pt states the providers at [facility] are great it is just to far of a drive.

## 2023-08-26 ENCOUNTER — Other Ambulatory Visit: Payer: Commercial Managed Care - PPO

## 2023-08-26 ENCOUNTER — Telehealth: Payer: Self-pay | Admitting: Neurology

## 2023-08-26 ENCOUNTER — Other Ambulatory Visit (INDEPENDENT_AMBULATORY_CARE_PROVIDER_SITE_OTHER): Payer: Commercial Managed Care - PPO

## 2023-08-26 DIAGNOSIS — R7989 Other specified abnormal findings of blood chemistry: Secondary | ICD-10-CM | POA: Diagnosis not present

## 2023-08-26 LAB — COMPREHENSIVE METABOLIC PANEL
ALT: 9 U/L (ref 0–35)
AST: 11 U/L (ref 0–37)
Albumin: 4.2 g/dL (ref 3.5–5.2)
Alkaline Phosphatase: 52 U/L (ref 39–117)
BUN: 41 mg/dL — ABNORMAL HIGH (ref 6–23)
CO2: 21 meq/L (ref 19–32)
Calcium: 9.5 mg/dL (ref 8.4–10.5)
Chloride: 107 meq/L (ref 96–112)
Creatinine, Ser: 4.2 mg/dL — ABNORMAL HIGH (ref 0.40–1.20)
GFR: 11.19 mL/min — CL (ref >60.00)
Glucose, Bld: 103 mg/dL — ABNORMAL HIGH (ref 70–99)
Potassium: 5.7 meq/L — ABNORMAL HIGH (ref 3.5–5.1)
Sodium: 137 meq/L (ref 135–145)
Total Bilirubin: 0.3 mg/dL (ref 0.2–1.2)
Total Protein: 8 g/dL (ref 6.0–8.3)

## 2023-08-26 NOTE — Telephone Encounter (Signed)
Pts provider through [facility] is aware of pts requests and will speak with her at her next appointment to inform pt how important it is to stay with the care team/plan she currently has set up.

## 2023-08-26 NOTE — Progress Notes (Signed)
Spoke with provider at [facility] who is managing these results; they are aware and will be reaching out to patient to manage results.

## 2023-08-26 NOTE — Telephone Encounter (Signed)
CRITICAL VALUE STICKER  CRITICAL VALUE: GFR 11.19  RECEIVER (on-site recipient of call): Cherrise Occhipinti  DATE & TIME NOTIFIED: 08/26/2023 11:15 am  MESSENGER (representative from lab): Curley Spice

## 2023-08-27 ENCOUNTER — Other Ambulatory Visit (HOSPITAL_COMMUNITY): Payer: Self-pay

## 2023-08-27 MED ORDER — LOKELMA 10 G PO PACK
1.0000 | PACK | Freq: Every day | ORAL | 0 refills | Status: DC
  Filled 2023-08-27: qty 7, 7d supply, fill #0

## 2023-09-01 NOTE — Progress Notes (Addendum)
 @APPTD @  @APPTT @  PATIENT PROFILE: Colleen Potter is a 58 y.o. female who is seen in consultation at the request of Therisa Rattler, [place] (Dr Zani) for a diagnosis of leiomyosarcoma.   ONCOLOGIC PROBLEM LIST:  Retroperitoneal Leiomyosarcoma       A. 01/13/23--routine labs at PCP revealed elevated Cr and c/f AKI; briefly evaluated at local ED on 01/14/23 but opted for further outpatient management       B. 03/20/23--MRI revealed IVC mass extending from kidneys to midway through liver, c/f sarcoma, patient was asymptomatic       C. 05/16/23--CT chest without evidence of metastatic disease; MRI abdomen with slight increased size of enhancing mass in the IVC and small amount of bland appearing thrombus in the infrarenal IVC      D. 07/21/23--open right nephrectomy, cholecystectomy, and retroperitoneal mass excision with pathology revealing grade 3 of 3, 13cm LMS, grade 3, 23 mitoses per 10hpf, negative margins (closest <0.1cm)       E. 09/02/2023 -- Initial consult with Dr. Orlie Slain  HISTORY OF PRESENT ILLNESS:  History of Present Illness Colleen Potter is a 58 year old female with retroperitoneal leiomyosarcoma who presents for follow-up after surgery.  She has a history of leiomyosarcoma, status post open right nephrectomy, cholecystectomy, and retroperitoneal mass excision. She experiences some stomach discomfort, which she attributes to the surgery, but denies any significant pain. The initial surgical pain has improved, and the staples were removed approximately two weeks ago. She still experiences some pain when lifting or bending over, which she attributes to the ongoing healing process.  Her eating habits are gradually returning to normal, although she is still building back up to her usual frequency and amount. She eats when she is hungry and does not eat just to eat. Her bowel movements have normalized, and she denies any significant changes in this regard.  She has a history of high blood pressure  and is currently on Procardia  for this condition. She was temporarily on a diuretic after her hospital stay but has since completed that course of medication.  There is a family history of cancer, with her mother having had lymphoma and her father also having had cancer. Both parents were elderly at the time of their diagnoses.  She works at [employer] and plans to return to work in March. She is currently on a lifting restriction due to her recent surgery.    REVIEW OF SYSTEMS: A ROS was performed including pertinent positives and negatives as documented in the HPI. All other systems reviewed are negative.   PAST MEDICAL HISTORY: Past Medical History:  Diagnosis Date  . Hypertension     PAST SURGICAL HISTORY: Past Surgical History:  Procedure Laterality Date  . EXCISION/DESTRUCTION INTRA ABDOMINAL TUMOR CYST/ENDOMETRIOMA N/A 07/21/2023   Procedure: Excision of Retroperitoneal Mass;  Surgeon: Zani, Sabino Jr., MD;  Location: [facility];  Service: General Surgery;  Laterality: N/A;  . NEPHRECTOMY RADICAL W/LYMPHADENECTOMY &/OR VENA CAVAL THROMBECTOMY Right 07/21/2023   Procedure: NEPHRECTOMY, RADICAL ANY OPEN APPROACH, WITH REGIONAL LYMPHADENECTOMY AND/OR VENA CAVALTHROMBECTOMY;  Surgeon: Maree Raynaldo Erm, MD;  Location: [facility];  Service: Urology;  Laterality: Right;  . RECONSTRUCTION VENA CAVA N/A 07/21/2023   Procedure: RECONSTRUCTION OF VENA CAVA, ANY METHOD;  Surgeon: Darra Dozier Blunt, MD;  Location: [facility];  Service: General Surgery;  Laterality: N/A;  . FRACTURE SURGERY Right    right ankle ORIF    FAMILY HISTORY: Family History  Problem Relation Name  Age of Onset  . Lymphoma Mother    . Cancer Father    . Anesthesia problems Neg Hx      SOCIAL HISTORY Social History   Tobacco Use  . Smoking status: Never  . Smokeless tobacco: Never  Vaping Use  . Vaping status: Never Used  Substance Use Topics  . Alcohol use: Not Currently  . Drug  use: Not Currently  She works at [employer]  ALLERGIES: No Known Allergies  CURRENT MEDICATIONS: Current Outpatient Medications  Medication Sig Dispense Refill  . aspirin  81 MG chewable tablet Take 1 tablet (81 mg total) by mouth once daily for 30 days 30 tablet 0  . NIFEdipine  (PROCARDIA -XL) 90 MG (OSM) XL tablet Take 90 mg by mouth every evening    . sodium zirconium cyclosilicate  (LOKELMA ) 10 gram packet Take 1 packet (10 g total) by mouth once daily 7 packet 0  . TORsemide (DEMADEX) 20 MG tablet Take 2 tablets (40 mg total) by mouth once daily for 30 days (Patient not taking: Reported on 09/02/2023) 60 tablet 0   No current facility-administered medications for this visit.   Facility-Administered Medications Ordered in Other Visits  Medication Dose Route Frequency Provider Last Rate Last Admin  . ondansetron  (PF) (ZOFRAN ) injection 4 mg  4 mg Intravenous Q8H PRN Timmie Sherwood Fallow, MD        PHYSICAL EXAM: BP 118/73 (BP Location: Left upper arm, Patient Position: Sitting, BP Cuff Size: Adult)   Pulse 91   Temp 37.1 C (98.7 F) (Oral)   Resp 18   Ht 165.1 cm (5' 5)   Wt 83.9 kg (184 lb 15.5 oz)   SpO2 99%   BMI 30.78 kg/m  PS of 1. Pain 3/10. General Appearance:    Alert, cooperative, no distress, appears well  Skin:   Head:    Skin color, texture, turgor normal, no rashes or lesions.   Normocephalic, without obvious abnormality, atraumatic  Eyes: ENT:    Conjunctiva/corneas clear, EOM's intact  Septum mucosa normal, no drainage or sinus tenderness. Oropharynx clear without lesions or exudates.  Back:     Symmetric, no curvature  Lungs:     Clear to auscultation bilaterally, no wheezes or rales  Chest wall:    No tenderness or deformity  Heart:    Regular rate and rhythm, no murmur, rub  or gallop, no edema  Abdomen:   Soft, non-tender, and non-distended, no masses, no hepatosplenomegaly.   Psych:   Mood euthymic, affect appropriate  Neurologic:   Alert  and oriented x 3. CNII-XII intact.       LABORATORY STUDIES: Appointment on 09/02/2023  Component Date Value Ref Range Status  . Sodium 09/02/2023 138  135 - 145 mmol/L Final  . Potassium 09/02/2023 5.4 (H)  3.5 - 5.0 mmol/L Final  . Chloride 09/02/2023 108  98 - 108 mmol/L Final  . Carbon Dioxide (CO2) 09/02/2023 21  21 - 30 mmol/L Final  . Urea Nitrogen (BUN) 09/02/2023 42 (H)  7 - 20 mg/dL Final  . Creatinine 97/81/7974 3.9 (H)  0.4 - 1.0 mg/dL Final  . Glucose 97/81/7974 103  70 - 140 mg/dL Final   Interpretive Data: Above is the NONFASTING reference range.   Below are the FASTING reference ranges:  NORMAL:      70-99 mg/dL  PREDIABETES: 899-874 mg/dL  DIABETES:    > 874 mg/dL   . Calcium  09/02/2023 9.4  8.7 - 10.2 mg/dL Final  . AST (Aspartate  Aminotransferase) 09/02/2023 11 (L)  15 - 41 U/L Final  . ALT (Alanine Aminotransferase) 09/02/2023 11  10 - 39 U/L Final  . Bilirubin, Total 09/02/2023 0.3 (L)  0.4 - 1.5 mg/dL Final  . Alk Phos (Alkaline Phosphatase) 09/02/2023 46  24 - 110 U/L Final  . Albumin 09/02/2023 3.6  3.5 - 4.8 g/dL Final  . Protein, Total 09/02/2023 8.0  6.2 - 8.1 g/dL Final  . Anion Gap 97/81/7974 9  3 - 12 mmol/L Final  . BUN/CREA Ratio 09/02/2023 11  6 - 27 Final  . Glomerular Filtration Rate (eGFR)  09/02/2023 13  mL/min/1.73sq m Final   CKD-EPI (2021) does not include patient's race in the calculation of eGFR. Monitoring changes of plasma creatinine and eGFR over time is useful for monitoring kidney function.  This change was made on 09/12/2020.  Interpretive Ranges for eGFR(CKD-EPI 2021):   eGFR:              > 60 mL/min/1.73 sq m - Normal  eGFR:              30 - 59 mL/min/1.73 sq m - Moderately Decreased  eGFR:              15 - 29 mL/min/1.73 sq m - Severely Decreased  eGFR:              < 15 mL/min/1.73 sq m -  Kidney Failure   Note: These eGFR calculations do not apply in acute situations  when eGFR is changing rapidly or in patients on  dialysis.      PATHOLOGY: The following pathology findings were personally reviewed and d/w pt:  Results     Procedure Component Value Units Date/Time   Pathology - General / Other [[id number]] Collected: 07/21/23 1017   Specimen: Tissue-Pathology from Gallbladder; Tissue-Pathology from Lymph Node; Tissue-Pathology from Vein; Tissue-Pathology from Kidney, Right; Tissue-Pathology Updated: 08/03/23 1520    Case Report --    Surgical Pathology                                Case: [id number]                                Authorizing Provider:  Zani, Sabino Jr., MD       Collected:           07/21/2023 1017             Ordering Location:     [facility]          Received:            07/21/2023 1754             Pathologist:           Rulon Elsie Ade, MD                                                   Specimens:   A) - Gallbladder, gallbladder  B) - Lymph Node, pre-caval lymph node                                                              C) - Vein, Gonadal Vein                                                                            D) - Kidney, Right, Right Radical nephrectomy                                                      E) - Tissue, Retro hepatic cava and tumor                                                 DIAGNOSIS --    A. Gallbladder, cholecystectomy:  Chronic cholecystitis with cholelithiasis.   B. Precaval lymph nodes, excision:   One lymph node, negative for malignancy (0/1).    C. Gonadal vein, excision:   Vascular and adipose tissue.  Negative for malignancy.   D. Right kidney, radical nephrectomy:   Renal tissue with global glomerulosclerosis (10%), arteriosclerosis, mild patchy interstitial chronic inflammation, and tubular atrophy. One lymph node, negative for malignancy (0/1).   Negative for malignancy.   E. Retroperitoneal mass, resection:   Leiomyosarcoma, FNCLCC grade  3 of 3, see synoptic report. Adrenal gland with no specific pathologic change, uninvolved by tumor.   Immunohistochemistry is performed to characterize the tumor and demonstrates the following staining profile in lesional cells:   Positive - SMA, Caldesmon Negative - Desmin, HMB45  Reviewed by resident/fellow HITT, ABIGAIL      Synoptic Report --    SOFT TISSUE: Resection SOFT TISSUE: RESECTION - E 8th Edition - Protocol posted: 01/12/2020  CLINICAL    Preresection Treatment:    No known preresection therapy   SPECIMEN    Procedure:    Mass resection and en block right adrenalectomy with separately submitted right nephrectomy.   TUMOR    Tumor Focality:    Unifocal     Tumor Site:    Retroperitoneum: Retrohepatic/IVC     Tumor Size:    Greatest Dimension (Centimeters): 13.0 cm      Additional Dimension (Centimeters):    6.1 cm      Additional Dimension (Centimeters):    4.7 cm    Histologic Type (WHO):          :    Leiomyosarcoma     Histologic Grade (FNCLCC):    Grade 3     Mitotic Rate:    23 mitoses per 10 high-power fields (HPF)    Necrosis (macroscopic or microscopic):    Present       Extent of Necrosis:  5% %    Treatment Effect:    No known presurgical therapy   MARGINS    Margin Status:    All margins negative for tumor       Closest Margin(s) to Tumor:    vascular       Distance from Tumor to Closest Margin:    0.1 cm      Other Close Margin(s) to Tumor (less than 2 cm):    peripheral soft tissue margins, <0.1 cm.   REGIONAL LYMPH NODES    Regional Lymph Node Status:          :    All regional lymph nodes negative for tumor       Number of Lymph Nodes Examined:    2   PATHOLOGIC STAGE CLASSIFICATION (pTNM, AJCC 8th Edition)    Reporting of pT, pN, and (when applicable) pM categories is based on information available to the pathologist at the time the report is issued. As per the AJCC (Chapter 1, 8th Ed.) it is the managing physician's responsibility to  establish the final pathologic stage based upon all pertinent information, including but potentially not limited to this pathology report.    Pathologic Stage Classification:    Histologic type appropriate for staging       pT Category:    pT3     pN Category:    pN0   SPECIAL STUDIES  Immunohistochemistry:    Positive for SMA and caldesmon; negative for desmin and HMB45   Cytogenetics:    Not performed   Molecular Pathology:    Not performed      Clinical Information --    Retroperitoneal mass     Gross Examination --    A.  Gallbladder, received fresh and placed in formalin on 07/21/2023 at 1730 is a 8.2 x 3.9 x 3.2 cm intact gallbladder.  The serosa is tan-purple, smooth, and glistening with a shaggy hepatic bed.  The gallbladder is opened to reveal a yellow-white mucosa with focal punctate hemorrhages and contains tan fluid and one 4.1 x 2.6 x 2.4 cm green-yellow and bosselated gallstone.  No masses or lesions are identified.  Representative sections and the cystic duct margin (en face) are submitted in block A1.  B.  Precaval lymph nodes, received fresh and placed in formalin on 07/21/2023 at 1730 is a 3.0 x 2.0 x 1.0 cm tan-brown tissue fragment.  Two lymph node candidates are identified ranging from 1.1 to 2.6 cm.  The lymph node candidates are entirely submitted as follows:  B1: One lymph node, bisected B2-B4: One lymph node, serially sectioned  C.  Gonadal vein, received fresh and placed in formalin on 07/21/2023 at 1730 is a 2.0 x 1.5 x 0.5 cm aggregate of two tan-brown tissue fragments, received unoriented and fragmented.  The tissue fragments are serially sectioned to reveal a wall thickness of 0.1 cm that varies from a free lumen to solid brown material in the lumen.  The specimen is entirely submitted in blocks C1-C2.  D.  Right radical nephrectomy, received unfixed and transferred to formalin on 07/21/2023 at 1810.  Specimen received: Nephrectomy Weight: 261  g  Specimen dimensions:  Kidney: 16.0 x 8.0 x 6.5 cm Perirenal fat thickness: 2.5 cm Ureter Length: 3.0 cm Ureter Circumference: 0.8 cm  Tumor size: Tumor is not grossly identified.  Secondary findings: The kidney is sectioned to reveal a pale-tan cut surface with distinct corticomedullary junctions.  No masses or lesions are grossly identified.  Representative  tissue from this case was provided to [facility]?: No.  A photograph is taken and saved as [id number].  BLOCK SUMMARY: D1: Ureteral margin, en face D2: Vessel margin, en face D3: Representative renal pelvis and renal sinus D4: Representative corticomedullary junction D5: Representative renal capsule, perirenal fat, and Gerota's fascia   E.  Retrohepatic cava and tumor, received fresh and placed in formalin on 07/21/2023 at 1810 is a 13.0 x 6.1 x 4.7 cm soft tissue mass contained within a thin walled vessel with a 7.4 x 6.4 x 1.0 cm portion of attached soft tissue that contains the adrenal gland. Note: The segment of vessel is received unoriented. There are various staples on the external surface of the vessel wall, which are removed to reveal minor vascular branches- and these margins are removed en face.   There is a staple line at one end that is removed and under inked orange (designated vascular margin #1); the opposite end demonstrates exposed vessel wall which is also inked orange (designated vascular margin #2).  The external surface of the vessel is inked blue.  Sectioning reveals a 13.0 x 6.1 x 4.7 cm tumor that encompasses the entire segment of vessel. Sectioning reveals a well-circumscribed, tan-pink, fleshy, and slightly bulging tumor with focal areas of hemorrhage comprising about less than 5% of the tumor.  There is a 0.9 x 0.6 x 0.5 cm focal well-circumscribed, yellow, soft nodule within the tumor.  The tumor is less than 0.1 cm from the stapled vascular margin #1, less than 0.1 cm from  the opposing vascular margin #2, and 0.1 cm from the vessel external surface.  The tumor abuts but does not grossly appear to involve the adjacent soft tissue or the adrenal gland.  A photograph is taken and saved as [phone].  Block summary: E1: Minor IVC branches, en face E2-E5: Stapled vascular margin #1, entirely submitted, perpendicular E6-E8: Closest approach of tumor to vascular margin #2, perpendicular, representative  E9-E10: Tumor to adrenal gland E11-E19: Representative sections of tumor  E11-E12: Hemorrhagic areas  E18-E19: Representative nodule  E. Torak with J. Chen and Dr. PHEBE Door.     Microscopic Examination --    Microscopic examination is performed.     Additional Documentation --    All immunohistochemistry, in situ hybridization tests and special stains performed at [facility] and reported herein were developed, validated and their performance characteristics determined by the [facility]. During the performance of these tests, appropriate positive and negative control slides are also performed and reviewed. All control slides and internal controls (when applicable) demonstrate the expected immunoreactive patterns and/or nucleic acid hybridization. These ancillary studies were deemed medically necessary by the requesting pathologist. They were ordered following review of the H&E and clinical history except when part of a liver/kidney protocol or where clinical history (e.g. immunocompromised, critically ill, history of malignancy) clearly indicates. Some of the tests may not be cleared or approved by the U.S. Food and Drug Administration (FDA).  The FDA has determined that such clearance or approval is not necessary.  These tests are used for clinical purposes and should not be regarded as investigational or as research.  This laboratory is certified under the Clinical Laboratory Improvement Amendments of 1988 (CLIA) as qualified to perform high  complexity clinical testing.     Attestation --    All of the diagnostic evaluations on the enumerated specimens have been personally conducted by the pathologists involved in the care of  this patient as indicated by the electronic signatures above.         RADIOLOGY: The following imaging studies were personally viewed, interpreted, and d/w pt:  Results for orders placed during the hospital encounter of 05/16/23  CT CHEST WITHOUT CONTRAST WITH 3D MIPS PROTOCOL  Narrative CT Chest without contrast  Indication: abdominal mass, eval met dz, R19.09 Other intra-abdominal and pelvic swelling, mass and lump  Compare: None  Technique:  Contiguous 0.625 mm axial images with 5 mm axial, 3 mm coronal, and 3 mm sagittal reconstructions were obtained from the neck base through the upper abdomen without IV contrast material. In addition, 3-D maximal intensity projection (MIP) reconstructions were performed to potentially increase the sensitivity for the detection of pulmonary nodules.  Findings:  The visualized thyroid  gland and neck base are unremarkable. The heart is globally enlarged. A few small foci of air within the left subclavian vein, right atrium, right ventricle, and main pulmonary artery likely related to recent contrast bolus injection.. Thoracic aorta is normal in course and caliber. Normal caliber pulmonary artery. Mild urinary artery lesions.  There is no supraclavicular or axillary lymphadenopathy. Scattered subcentimeter axial lymph nodes. There is no mediastinal lymphadenopathy. Patulous esophagus.  The central airways are patent. Expiratory phase of imaging with dependent atelectasis bilateral lower lobes independent portions of the bilateral upper lobes. There is a 3 mm solid nodule in the right middle lobe (series 4, image 187). There is an additional right middle lobe solid nodule measuring 4 mm seen on series 4, image 152.  Please see separately dictated MR  of the abdomen for assessment of findings below the diaphragm.  No acute or aggressive appearing osseous lesions.  Impression: 1.  There are a few nonspecific pulmonary nodules in the right middle lobe measuring up to 4 mm. Attention on follow-up per clinical protocol. 2.  Please see separately dictated MRI of the abdomen for findings below the diaphragm.  Electronically Signed by:  Silvano Borer, MD, [facility] Electronically Signed on:  05/16/2023 4:53 PM    Results for orders placed during the hospital encounter of 05/16/23  MRI PELVIS WITH AND WITHOUT CONTRAST  Narrative MRI ABDOMEN WITH AND WITHOUT CONTRAST, MRI PELVIS WITH AND WITHOUT CONTRAST  INDICATION: abdominal mass, R19.09 Other intra-abdominal and pelvic swelling, mass and lump  COMPARISON: Outside MR dated 03/20/2023  Technique: MRV abdomen and pelvic protocol from the diaphragm through the femoral heads bilaterally.  Pre and post contrast in the axial and coronal plane were obtained. 3-D angiographic imaging was performed.  Intravenous contrast was administered for this exam. If IV contrast material had not been administered, the likelihood of detecting abnormalities relevant to the patient's condition would have been substantially decreased.  3-D reconstructions were likewise performed to increase the sensitivity of detecting clinically relevant pathology.  FINDINGS:  Examination quality is mildly degraded by motion artifact.  VASCULAR:  There is a 11.7 x 4.5 x 4.6 cm heterogeneously enhancing mass extending from the intrahepatic IVC to just below the level of the renal veins. This previously measured 9.6 x 4.4 x 4.0 cm when remeasured similarly. There is a small amount of nonocclusive bland thrombus in the infrarenal IVC extending into the right renal vein. The bilateral common iliac, external iliac, and visualized portions of the bilateral common femoral veins are widely without evidence of  thrombus.  The abdominal aorta appears patent and normal in caliber without evidence of significant plaque or stenosis. The celiac artery, SMA, and IMA are patent. The  bilateral common iliac, external iliac, and visualized common femoral arteries are widely patent. The bilateral renal arteries appear patent, though there may be some mass effect on the right renal artery secondary to the aforementioned IVC mass.  NONVASCULAR:  Please see separate dictated chest CT for findings above the diaphragm.  Limited assessment of the liver, spleen, pancreas, and left adrenal glands is unremarkable. There is asymmetric thickening of the right adrenal gland which is indistinguishable from the adjacent IVC. Cholelithiasis. The bowel is normal in caliber. Heterogeneous appearing uterus with multiple masses favored to represent fibroids. No significant free fluid in the abdomen or pelvis. The urinary bladder is decompressed. No acute or aggressive appearing osseous lesions.  Impression 1.  Slightly increased size of the now 11.7 x 4.5 x 4.6 cm expansile, heterogeneously enhancing mass centered in the in the IVC extending from the intrahepatic IVC to just below the level of the renal veins, concerning for neoplasm such as sarcoma. There is a small amount of bland appearing nonocclusive thrombus in the infrarenal IVC extending. 2.  There is asymmetric thickening of the right adrenal gland with loss of the fat plane between the IVC and right adrenal gland, concerning for direct involvement. This is suboptimally evaluated on the current exam and could be better evaluated on dedicated CT of the abdomen and pelvis with contrast. 3.  Heterogeneous appearance of the uterus with multiple masses which may represent fibroids though suboptimally evaluated on this vascular protocol examination.  Electronically Signed by:  Silvano Borer, MD, [facility] Electronically Signed on:  05/16/2023 4:46 PM   MRI  ABDOMEN WITH AND WITHOUT CONTRAST  Narrative MRI ABDOMEN WITH AND WITHOUT CONTRAST, MRI PELVIS WITH AND WITHOUT CONTRAST  INDICATION: abdominal mass, R19.09 Other intra-abdominal and pelvic swelling, mass and lump  COMPARISON: Outside MR dated 03/20/2023  Technique: MRV abdomen and pelvic protocol from the diaphragm through the femoral heads bilaterally.  Pre and post contrast in the axial and coronal plane were obtained. 3-D angiographic imaging was performed.  Intravenous contrast was administered for this exam. If IV contrast material had not been administered, the likelihood of detecting abnormalities relevant to the patient's condition would have been substantially decreased.  3-D reconstructions were likewise performed to increase the sensitivity of detecting clinically relevant pathology.  FINDINGS:  Examination quality is mildly degraded by motion artifact.  VASCULAR:  There is a 11.7 x 4.5 x 4.6 cm heterogeneously enhancing mass extending from the intrahepatic IVC to just below the level of the renal veins. This previously measured 9.6 x 4.4 x 4.0 cm when remeasured similarly. There is a small amount of nonocclusive bland thrombus in the infrarenal IVC extending into the right renal vein. The bilateral common iliac, external iliac, and visualized portions of the bilateral common femoral veins are widely without evidence of thrombus.  The abdominal aorta appears patent and normal in caliber without evidence of significant plaque or stenosis. The celiac artery, SMA, and IMA are patent. The bilateral common iliac, external iliac, and visualized common femoral arteries are widely patent. The bilateral renal arteries appear patent, though there may be some mass effect on the right renal artery secondary to the aforementioned IVC mass.  NONVASCULAR:  Please see separate dictated chest CT for findings above the diaphragm.  Limited assessment of the liver, spleen,  pancreas, and left adrenal glands is unremarkable. There is asymmetric thickening of the right adrenal gland which is indistinguishable from the adjacent IVC. Cholelithiasis. The bowel is normal in caliber. Heterogeneous appearing  uterus with multiple masses favored to represent fibroids. No significant free fluid in the abdomen or pelvis. The urinary bladder is decompressed. No acute or aggressive appearing osseous lesions.  Impression 1.  Slightly increased size of the now 11.7 x 4.5 x 4.6 cm expansile, heterogeneously enhancing mass centered in the in the IVC extending from the intrahepatic IVC to just below the level of the renal veins, concerning for neoplasm such as sarcoma. There is a small amount of bland appearing nonocclusive thrombus in the infrarenal IVC extending. 2.  There is asymmetric thickening of the right adrenal gland with loss of the fat plane between the IVC and right adrenal gland, concerning for direct involvement. This is suboptimally evaluated on the current exam and could be better evaluated on dedicated CT of the abdomen and pelvis with contrast. 3.  Heterogeneous appearance of the uterus with multiple masses which may represent fibroids though suboptimally evaluated on this vascular protocol examination.  Electronically Signed by:  Silvano Borer, MD, [facility] Electronically Signed on:  05/16/2023 4:46 PM     ASSESSMENT AND PLAN: 58 y.o. female with a diagnosis of leiomyosarcoma.     ICD-10-CM   1. 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 Stage 3b chronic kidney disease (CMS/HHS-HCC)  N18.32     3. Essential hypertension  I10     4. Bradycardia  R00.1       Assessment & Plan Leiomyosarcoma of retroperitoneum 58 year old woman with leiomyosarcoma of the retroperitoneum, status post open right nephrectomy, cholecystectomy, and retroperitoneal mass excision. Pathology: grade 3 of 3, 13 cm leiomyosarcoma with 23 mitoses per  10 high power field, close but negative margins. No current evidence of recurrence. No adjuvant chemotherapy or radiation therapy indicated. Discussed follow-up imaging: scans every three months for two years, then every four to six months up to five years. After five years, recurrence risk is very low. Patient prefers follow-up at [facility] for specialized sarcoma care. - recommend CT chest without contrast - recommend MRI abdomen and pelvis vs other imaging. Pt requesting MRI under anesthesia - pt requests coordination of MRI and CT under general anesthesia with radiology - Schedule follow-up scans every three months for two years, then every four to six months up to five years - Refer to medical oncologist in [place] for local follow-up if needed - I reached out to Dr Zani as she doesn't have any follow up with her, to clarify who will complete surveillance since she has no role for adj chemo. ADDENDUM: per d/w Dr Zani, he will arrange follow up.  Bradycardia Post-surgical bradycardia noted with heart rate dropping to 30 bpm, currently resolved with heart rate at 90 bpm. Potential causes include medication-related bradycardia or post-surgical effects. Cardiology follow-up scheduled. - Attend cardiology appointment for further evaluation and management  Hypertension Hypertension managed with Procardia . - Continue Procardia   Chronic Kidney Disease Chronic kidney disease. Recent nephrology follow-up missed and rescheduled for March. - Attend rescheduled nephrology appointment in March  Follow-up - Coordinate with Dr. Zani for follow-up - Schedule follow-up appointments at [facility] every three to four months for scans and evaluations - Ensure communication with [facility] medical oncologist if follow-up care is transferred. This note has been created using automated tools and reviewed for accuracy by JUNEKO ELAINE GRILLEY-OLSON.   Juneko Elaine Grilley-Olson, MD

## 2023-09-02 DIAGNOSIS — I10 Essential (primary) hypertension: Secondary | ICD-10-CM | POA: Diagnosis not present

## 2023-09-02 DIAGNOSIS — N1832 Chronic kidney disease, stage 3b: Secondary | ICD-10-CM | POA: Diagnosis not present

## 2023-09-02 DIAGNOSIS — C480 Malignant neoplasm of retroperitoneum: Secondary | ICD-10-CM | POA: Diagnosis not present

## 2023-09-02 DIAGNOSIS — C499 Malignant neoplasm of connective and soft tissue, unspecified: Secondary | ICD-10-CM | POA: Diagnosis not present

## 2023-09-02 DIAGNOSIS — R001 Bradycardia, unspecified: Secondary | ICD-10-CM | POA: Diagnosis not present

## 2023-09-03 DIAGNOSIS — R001 Bradycardia, unspecified: Secondary | ICD-10-CM | POA: Insufficient documentation

## 2023-09-03 DIAGNOSIS — C480 Malignant neoplasm of retroperitoneum: Secondary | ICD-10-CM | POA: Diagnosis present

## 2023-09-16 DIAGNOSIS — N1832 Chronic kidney disease, stage 3b: Secondary | ICD-10-CM | POA: Diagnosis not present

## 2023-09-18 ENCOUNTER — Encounter (HOSPITAL_COMMUNITY): Payer: Self-pay | Admitting: Internal Medicine

## 2023-09-18 ENCOUNTER — Other Ambulatory Visit: Payer: Self-pay

## 2023-09-18 ENCOUNTER — Observation Stay (HOSPITAL_COMMUNITY)
Admission: EM | Admit: 2023-09-18 | Discharge: 2023-09-19 | Disposition: A | Discharge status: Home / Self Care | Attending: Family Medicine | Admitting: Family Medicine

## 2023-09-18 ENCOUNTER — Observation Stay (HOSPITAL_COMMUNITY)

## 2023-09-18 DIAGNOSIS — D75839 Thrombocytosis, unspecified: Secondary | ICD-10-CM | POA: Diagnosis not present

## 2023-09-18 DIAGNOSIS — N189 Chronic kidney disease, unspecified: Secondary | ICD-10-CM | POA: Diagnosis not present

## 2023-09-18 DIAGNOSIS — D631 Anemia in chronic kidney disease: Secondary | ICD-10-CM | POA: Diagnosis not present

## 2023-09-18 DIAGNOSIS — Z79899 Other long term (current) drug therapy: Secondary | ICD-10-CM | POA: Diagnosis not present

## 2023-09-18 DIAGNOSIS — E8722 Chronic metabolic acidosis: Secondary | ICD-10-CM | POA: Diagnosis not present

## 2023-09-18 DIAGNOSIS — N184 Chronic kidney disease, stage 4 (severe): Secondary | ICD-10-CM | POA: Insufficient documentation

## 2023-09-18 DIAGNOSIS — C480 Malignant neoplasm of retroperitoneum: Secondary | ICD-10-CM | POA: Diagnosis not present

## 2023-09-18 DIAGNOSIS — I129 Hypertensive chronic kidney disease with stage 1 through stage 4 chronic kidney disease, or unspecified chronic kidney disease: Secondary | ICD-10-CM | POA: Diagnosis not present

## 2023-09-18 DIAGNOSIS — E875 Hyperkalemia: Principal | ICD-10-CM

## 2023-09-18 DIAGNOSIS — D649 Anemia, unspecified: Secondary | ICD-10-CM | POA: Diagnosis not present

## 2023-09-18 DIAGNOSIS — Z7901 Long term (current) use of anticoagulants: Secondary | ICD-10-CM | POA: Diagnosis not present

## 2023-09-18 DIAGNOSIS — E872 Acidosis, unspecified: Secondary | ICD-10-CM | POA: Diagnosis present

## 2023-09-18 DIAGNOSIS — I10 Essential (primary) hypertension: Secondary | ICD-10-CM | POA: Diagnosis present

## 2023-09-18 LAB — URINALYSIS, ROUTINE W REFLEX MICROSCOPIC
Bilirubin Urine: NEGATIVE
Glucose, UA: NEGATIVE mg/dL
Hgb urine dipstick: NEGATIVE
Ketones, ur: NEGATIVE mg/dL
Nitrite: NEGATIVE
Protein, ur: NEGATIVE mg/dL
Specific Gravity, Urine: 1.013 (ref 1.005–1.030)
pH: 5 (ref 5.0–8.0)

## 2023-09-18 LAB — CBC WITH DIFFERENTIAL/PLATELET
Abs Immature Granulocytes: 0.03 10*3/uL (ref 0.00–0.07)
Basophils Absolute: 0.1 10*3/uL (ref 0.0–0.1)
Basophils Relative: 1 %
Eosinophils Absolute: 0.3 10*3/uL (ref 0.0–0.5)
Eosinophils Relative: 3 %
HCT: 31.8 % — ABNORMAL LOW (ref 36.0–46.0)
Hemoglobin: 10 g/dL — ABNORMAL LOW (ref 12.0–15.0)
Immature Granulocytes: 0 %
Lymphocytes Relative: 27 %
Lymphs Abs: 2.4 10*3/uL (ref 0.7–4.0)
MCH: 29.5 pg (ref 26.0–34.0)
MCHC: 31.4 g/dL (ref 30.0–36.0)
MCV: 93.8 fL (ref 80.0–100.0)
Monocytes Absolute: 0.5 10*3/uL (ref 0.1–1.0)
Monocytes Relative: 5 %
Neutro Abs: 5.8 10*3/uL (ref 1.7–7.7)
Neutrophils Relative %: 64 %
Platelets: 433 10*3/uL — ABNORMAL HIGH (ref 150–400)
RBC: 3.39 MIL/uL — ABNORMAL LOW (ref 3.87–5.11)
RDW: 14.2 % (ref 11.5–15.5)
WBC: 9 10*3/uL (ref 4.0–10.5)
nRBC: 0 % (ref 0.0–0.2)

## 2023-09-18 LAB — BASIC METABOLIC PANEL
Anion gap: 10 (ref 5–15)
Anion gap: 5 (ref 5–15)
BUN: 56 mg/dL — ABNORMAL HIGH (ref 6–20)
BUN: 41 mg/dL — ABNORMAL HIGH (ref 6–20)
CO2: 15 mmol/L — ABNORMAL LOW (ref 22–32)
CO2: 14 mmol/L — ABNORMAL LOW (ref 22–32)
Calcium: 9.6 mg/dL (ref 8.9–10.3)
Calcium: 6.8 mg/dL — ABNORMAL LOW (ref 8.9–10.3)
Chloride: 109 mmol/L (ref 98–111)
Chloride: 118 mmol/L — ABNORMAL HIGH (ref 98–111)
Creatinine, Ser: 4.15 mg/dL — ABNORMAL HIGH (ref 0.44–1.00)
Creatinine, Ser: 2.86 mg/dL — ABNORMAL HIGH (ref 0.44–1.00)
GFR, Estimated: 12 mL/min — ABNORMAL LOW (ref >60)
GFR, Estimated: 19 mL/min — ABNORMAL LOW (ref >60)
Glucose, Bld: 110 mg/dL — ABNORMAL HIGH (ref 70–99)
Glucose, Bld: 107 mg/dL — ABNORMAL HIGH (ref 70–99)
Potassium: 7 mmol/L (ref 3.5–5.1)
Potassium: 4.2 mmol/L (ref 3.5–5.1)
Sodium: 134 mmol/L — ABNORMAL LOW (ref 135–145)
Sodium: 137 mmol/L (ref 135–145)

## 2023-09-18 LAB — CBG MONITORING, ED: Glucose-Capillary: 106 mg/dL — ABNORMAL HIGH (ref 70–99)

## 2023-09-18 LAB — POTASSIUM: Potassium: 6 mmol/L — ABNORMAL HIGH (ref 3.5–5.1)

## 2023-09-18 LAB — SODIUM, URINE, RANDOM: Sodium, Ur: 115 mmol/L

## 2023-09-18 LAB — CREATININE, URINE, RANDOM: Creatinine, Urine: 127 mg/dL

## 2023-09-18 MED ORDER — STERILE WATER FOR INJECTION IV SOLN
INTRAVENOUS | Status: DC
  Filled 2023-09-18: qty 150
  Filled 2023-09-18: qty 1000

## 2023-09-18 MED ORDER — SODIUM CHLORIDE 0.9 % IV BOLUS
1500.0000 mL | Freq: Once | INTRAVENOUS | Status: AC
  Administered 2023-09-18: 1500 mL via INTRAVENOUS

## 2023-09-18 MED ORDER — SODIUM BICARBONATE 8.4 % IV SOLN
50.0000 meq | Freq: Once | INTRAVENOUS | Status: AC
  Administered 2023-09-18: 50 meq via INTRAVENOUS
  Filled 2023-09-18: qty 50

## 2023-09-18 MED ORDER — ALBUTEROL SULFATE (2.5 MG/3ML) 0.083% IN NEBU
10.0000 mg | INHALATION_SOLUTION | Freq: Once | RESPIRATORY_TRACT | Status: AC
  Administered 2023-09-18: 10 mg via RESPIRATORY_TRACT
  Filled 2023-09-18: qty 12

## 2023-09-18 MED ORDER — SODIUM CHLORIDE 0.9 % IV SOLN: INTRAVENOUS | Status: AC

## 2023-09-18 MED ORDER — NEPRO/CARBSTEADY PO LIQD
237.0000 mL | Freq: Two times a day (BID) | ORAL | Status: DC
  Administered 2023-09-19 (×2): 237 mL via ORAL
  Filled 2023-09-18 (×2): qty 237

## 2023-09-18 MED ORDER — CALCIUM GLUCONATE-NACL 1-0.675 GM/50ML-% IV SOLN
1.0000 g | Freq: Once | INTRAVENOUS | Status: AC
  Administered 2023-09-18: 1000 mg via INTRAVENOUS
  Filled 2023-09-18: qty 50

## 2023-09-18 MED ORDER — DEXTROSE 50 % IV SOLN
1.0000 | Freq: Once | INTRAVENOUS | Status: AC
  Administered 2023-09-18: 50 mL via INTRAVENOUS
  Filled 2023-09-18: qty 50

## 2023-09-18 MED ORDER — INSULIN ASPART 100 UNIT/ML IV SOLN
10.0000 [IU] | Freq: Once | INTRAVENOUS | Status: AC
  Administered 2023-09-18: 10 [IU] via INTRAVENOUS
  Filled 2023-09-18: qty 0.1

## 2023-09-18 MED ORDER — SODIUM CHLORIDE 0.9 % IV BOLUS
500.0000 mL | Freq: Once | INTRAVENOUS | Status: AC
  Administered 2023-09-18: 500 mL via INTRAVENOUS

## 2023-09-18 MED ORDER — SODIUM ZIRCONIUM CYCLOSILICATE 10 G PO PACK
10.0000 g | PACK | Freq: Once | ORAL | Status: AC
  Administered 2023-09-18: 10 g via ORAL
  Filled 2023-09-18: qty 1

## 2023-09-18 MED ORDER — ACETAMINOPHEN 325 MG PO TABS: 650.0000 mg | ORAL_TABLET | Freq: Four times a day (QID) | ORAL | Status: DC | PRN

## 2023-09-18 MED ORDER — ACETAMINOPHEN 650 MG RE SUPP: 650.0000 mg | Freq: Four times a day (QID) | RECTAL | Status: DC | PRN

## 2023-09-18 MED ORDER — SODIUM ZIRCONIUM CYCLOSILICATE 10 G PO PACK
10.0000 g | PACK | Freq: Three times a day (TID) | ORAL | Status: DC
  Administered 2023-09-18 – 2023-09-19 (×3): 10 g via ORAL
  Filled 2023-09-18 (×3): qty 1

## 2023-09-18 NOTE — ED Provider Notes (Signed)
 [place] EMERGENCY DEPARTMENT AT [facility] Provider Note   CSN: [id number] Arrival date & time: 09/18/23  4098     History  Chief Complaint  Patient presents with   Hyperkalemia     Colleen Potter is a 58 y.o. female.  This is a 58 year old female who is here today for high potassium.  She went to her nephrologist yesterday, had blood work done and was told to come to the emergency room.  Patient with history of chronic kidney disease stage IV, has 1 kidney after having surgery for leiomyosarcoma in January.        Home Medications Prior to Admission medications   Medication Sig Start Date End Date Taking? Authorizing Provider  diazepam (VALIUM) 5 MG tablet Take 1 tablet 30-45 minutes prior to MRI Patient not taking: Reported on 03/19/2023 03/07/23   [name], FNP  hydrOXYzine (ATARAX) 25 MG tablet Take 1-2 tablets (25-50 mg total) by mouth every 6 (six) hours as needed for itching 08/19/23     NIFEdipine (PROCARDIA XL/NIFEDICAL-XL) 90 MG 24 hr tablet Take 1 tablet (90 mg total) by mouth daily. 08/20/23   [name], FNP  sodium zirconium cyclosilicate (LOKELMA) 10 g PACK packet Take 1 packet (10 g) by mouth three times a week on  Mondays, Wednesdays, and Fridays. Dissolve in approximately 3 tablespoons of water 03/12/23     sodium zirconium cyclosilicate (LOKELMA) 10 g PACK packet Take 10 g (1 packet total) by mouth daily. 08/26/23         Allergies    Patient has no known allergies.    Review of Systems   Review of Systems  Physical Exam Updated Vital Signs BP 133/86   Pulse 93   Temp 98.3 F (36.8 C) (Oral)   Resp 19   Ht 5\' 5"  (1.651 m)   Wt 83 kg   SpO2 99%   BMI 30.45 kg/m  Physical Exam Vitals reviewed.  Cardiovascular:     Rate and Rhythm: Normal rate.  Abdominal:     General: Abdomen is flat.  Musculoskeletal:     Cervical back: Normal range of motion.  Neurological:     General: No focal deficit present.     Mental  Status: She is alert.     ED Results / Procedures / Treatments   Labs (all labs ordered are listed, but only abnormal results are displayed) Labs Reviewed  BASIC METABOLIC PANEL - Abnormal; Notable for the following components:      Result Value   Sodium 134 (*)    Potassium 7.0 (*)    CO2 15 (*)    Glucose, Bld 110 (*)    BUN 56 (*)    Creatinine, Ser 4.15 (*)    GFR, Estimated 12 (*)    All other components within normal limits  CBC WITH DIFFERENTIAL/PLATELET - Abnormal; Notable for the following components:   RBC 3.39 (*)    Hemoglobin 10.0 (*)    HCT 31.8 (*)    Platelets 433 (*)    All other components within normal limits    EKG None  Radiology No results found.  Procedures .Critical Care  Performed by: Arletha Pili, DO Authorized by: Arletha Pili, DO   Critical care provider statement:    Critical care time (minutes):  30   Critical care time was exclusive of:  Separately billable procedures and treating other patients   Critical care was necessary to treat or prevent imminent  or life-threatening deterioration of the following conditions:  Metabolic crisis   Critical care was time spent personally by me on the following activities:  Blood draw for specimens, development of treatment plan with patient or surrogate, evaluation of patient's response to treatment and examination of patient   Care discussed with: admitting provider       Medications Ordered in ED Medications  calcium gluconate 1 g/ 50 mL sodium chloride IVPB (has no administration in time range)  sodium chloride 0.9 % bolus 500 mL (500 mLs Intravenous New Bag/Given 09/18/23 0219)  sodium zirconium cyclosilicate (LOKELMA) packet 10 g (10 g Oral Given 09/18/23 0211)  albuterol (PROVENTIL) (2.5 MG/3ML) 0.083% nebulizer solution 10 mg (10 mg Nebulization Given 09/18/23 0219)  insulin aspart (novoLOG) injection 10 Units (10 Units Intravenous Given 09/18/23 0222)    And  dextrose 50 % solution 50  mL (50 mLs Intravenous Given 09/18/23 0222)    ED Course/ Medical Decision Making/ A&P                                 Medical Decision Making 58 year old female here today for hyperkalemia.  Plan-patient's potassium 7.  She does not have any peaked T waves on my independent review of the patient's EKG.  I started the patient on calcium, insulin and dextrose, albuterol, Lasix and Kayexalate.  Patient follows with Dr. Thedore Mins from neurology.  Patient will require admission for hyperkalemia.  Patient declined CT imaging without contrast.  She states that she gets too claustrophobic and would need to be "knocked out."  I reviewed the patient's most recent nephrology office note.  Will admit patient to hospitalist for hyperkalemia.  Amount and/or Complexity of Data Reviewed Labs: ordered.  Risk OTC drugs. Prescription drug management. Decision regarding hospitalization.           Final Clinical Impression(s) / ED Diagnoses Final diagnoses:  Hyperkalemia    Rx / DC Orders ED Discharge Orders     None         Arletha Pili, DO 09/18/23 0229

## 2023-09-18 NOTE — ED Triage Notes (Signed)
 Pt arrived with c/o hyperkalemia. Pt saw neuro yesterday and was advised to be checked out in ED.

## 2023-09-18 NOTE — Consult Note (Signed)
 Renal Service Consult Note [facility]  Colleen Potter 09/18/2023 Colleen Krabbe, MD Requesting Physician: Dr. Roxy Horseman  Reason for Consult: Renal failure HPI: The patient is a 58 y.o. year-old w/ PMH as below who presented to ED due to abnormal labs on 09/15/23 w/ a high potassium. She came to ED where her K+ was found to be 7.0.  She has rec'd full measures of temporizing substances this morning and q 8 hrs lokelma at 10gm. Her VSS and repeat labs done at 11 am showed creat down from 4.1 to 2.8, and K+ down to 4.2 (from 7.0). We are asked to see for renal faliure and hyperkalemia.   Pt seen in room. She is f/b Dr Thedore Mins at [facility] and has appt on 3/20.  She denies any voiding issues, no LUTS, no swelling , no SOB, no fevers. She does eat a lot of high K+ foods. Her nephrectomy was likely on the R side (the scar tails to the right) and was done in Jan 2025.   PMH S/P R nephrectomy  HTN  Renal failure    ROS - denies CP, no joint pain, no HA, no blurry vision, no rash, no diarrhea, no nausea/ vomiting, no dysuria, no difficulty voiding   Past Medical History  Past Medical History:  Diagnosis Date   Hypertension    states under control with med., has been on med. x 2 yr.   Trimalleolar fracture of ankle, closed 08/03/2016   right   Past Surgical History  Past Surgical History:  Procedure Laterality Date   NO PAST SURGERIES     ORIF ANKLE FRACTURE Right 08/07/2016   Procedure: OPEN REDUCTION INTERNAL FIXATION (ORIF) RIGHT ANKLE FRACTURE;  Surgeon: Tarry Kos, MD;  Location: [facility];  Service: Orthopedics;  Laterality: Right;   RADIOLOGY WITH ANESTHESIA N/A 03/20/2023   Procedure: MRI OF ABDOMEN WITH AND WITHOUT CONTRAST WITH ANESTHESIA;  Surgeon: Radiologist, Medication, MD;  Location: [facility];  Service: Radiology;  Laterality: N/A;   Family History No family history on file. Social History  reports that she has never smoked. She has never used smokeless  tobacco. She reports that she does not drink alcohol and does not use drugs. Allergies No Known Allergies Home medications Prior to Admission medications   Medication Sig Start Date End Date Taking? Authorizing Provider  NIFEdipine (PROCARDIA XL/NIFEDICAL-XL) 90 MG 24 hr tablet Take 1 tablet (90 mg total) by mouth daily. 08/20/23  Yes [name], FNP  sodium zirconium cyclosilicate (LOKELMA) 10 g PACK packet Take 10 g (1 packet total) by mouth daily. Patient not taking: Reported on 09/18/2023 08/26/23        Vitals:   09/18/23 0639 09/18/23 1030 09/18/23 1037 09/18/23 1535  BP:  121/79  (!) 144/86  Pulse:  99  71  Resp:  19    Temp: 97.6 F (36.4 C)  98 F (36.7 C) 98.6 F (37 C)  TempSrc: Oral  Oral Oral  SpO2:  99%  100%  Weight:      Height:       Exam Gen alert, no distress No rash, cyanosis or gangrene Sclera anicteric, throat clear  No jvd or bruits Chest clear bilat to bases, no rales/ wheezing RRR no MRG Abd soft ntnd no mass or ascites +bs GU defer MS no joint effusions or deformity Ext no LE or UE edema, no other edema Neuro is alert, Ox 3 , nf  Renal-related home meds: - procardial xl 90 every day - lokelma 10gm daily  Date   Creat  eGFR (ml/min) 2018- 2023  0.74- 1.04 Jun-July 2024  1.71- 2.32 23- 33 ml/min 08/15/23  5.74  8  08/19/23  5.11  9 08/26/23   4.20  11  09/18/23  4.15, 2.86 12, 19 ml/min    UA not done   UNa, UCr not done   Renal US pend  Assessment/ Plan: Hyperkalemia - due to high K+ foods and advanced CKD most likely. Temporizing measures were given this am along w/ lokelma. There were no EKG changes. Repeat labs at 11 am showed K+ down to 4.2 (from 7.0). Much better. I gave her the list of 5 highest potassium foods and if she follows that she should be okay. She is not on any acei/ arb or K+ raising diuretics. She can go in am if labs are stable.   CKD 4 - pt developed new renal failure in mid 2024, not sure the cause. Dr  Thedore Mins probably has more records. She had her R kidney removed in Jan 2025 and her creat bumped from 1.7- 2.3 up to low 5's, then improved to the low 4s in the mid Feb labs. Here creat was 4.1 on admission, but repeat later today showed creat 2.8. Possibly she has been vol depleted. Will bolus 1.5 NS and give her IV sod bicarb at 100 /hr overnight, see if we can keep creat down < 4.  Recent R nephrectomy       Vinson Moselle  MD [facility] 09/18/2023, 4:24 PM  Recent Labs  Lab 09/18/23 0112 09/18/23 1057  HGB 10.0*  --   CALCIUM 9.6 6.8*  CREATININE 4.15* 2.86*  K 7.0* 4.2   Inpatient medications:  sodium zirconium cyclosilicate  10 g Oral Q8H    acetaminophen **OR** acetaminophen

## 2023-09-18 NOTE — H&P (Signed)
 History and Physical    Patient: Colleen Potter ZOX:[id number] DOB: 1965/07/22 DOA: 09/18/2023 DOS: the patient was seen and examined on 09/18/2023 PCP: [name], FNP  Patient coming from: Home  Chief Complaint:  Chief Complaint  Patient presents with   Hyperkalemia    HPI: Colleen Potter is a 58 y.o. female with medical history significant of hypertension, right trimalleolar ankle fracture, stage IV CKD, history of nephrectomy due to leiomyosarcoma, bradycardia who was asked by her nephrologist to come to the emergency department due to hyperkalemia.  No fever, chills or night sweats. No sore throat, rhinorrhea, dyspnea, wheezing or hemoptysis.  No chest pain, palpitations, diaphoresis, PND, orthopnea or pitting edema of the lower extremities.  No abdominal pain, diarrhea, constipation, melena or hematochezia.  No flank pain, dysuria, frequency or hematuria.  No polyuria, polydipsia, polyphagia or blurred vision.  Lab work: CBC showed a white count of 9.0, hemoglobin 10.0 g/dL and platelets 811.  Sodium 134, potassium 7.0, chloride 109 and CO2 15 mmol/L with an anion gap of 10.  Glucose 110, BUN 56, creatinine 4.15 and calcium 9.6 mg/dL.  ED course: Initial vital signs were temperature 98.3 F, pulse 93, respirations 16, BP 129/93 mmHg O2 sat 100% on room air.  The patient received a continuous 10 mg albuterol neb, calcium gluconate 1 g IVPB, dextrose 25 g along with NovoLog 10 units IVP, sodium bicarbonate 50 mEq IVP and Lokelma 10 g p.o. x 1.   Review of Systems: As mentioned in the history of present illness. All other systems reviewed and are negative. Past Medical History:  Diagnosis Date   Hypertension    states under control with med., has been on med. x 2 yr.   Trimalleolar fracture of ankle, closed 08/03/2016   right   Past Surgical History:  Procedure Laterality Date   NO PAST SURGERIES     ORIF ANKLE FRACTURE Right 08/07/2016   Procedure: OPEN REDUCTION INTERNAL  FIXATION (ORIF) RIGHT ANKLE FRACTURE;  Surgeon: Tarry Kos, MD;  Location: [facility];  Service: Orthopedics;  Laterality: Right;   RADIOLOGY WITH ANESTHESIA N/A 03/20/2023   Procedure: MRI OF ABDOMEN WITH AND WITHOUT CONTRAST WITH ANESTHESIA;  Surgeon: Radiologist, Medication, MD;  Location: [facility];  Service: Radiology;  Laterality: N/A;   Social History:  reports that she has never smoked. She has never used smokeless tobacco. She reports that she does not drink alcohol and does not use drugs.  No Known Allergies  No family history on file.  Prior to Admission medications   Medication Sig Start Date End Date Taking? Authorizing Provider  aspirin 81 MG chewable tablet Chew 81 mg by mouth daily. 07/29/23  Yes [provider]  clopidogrel (PLAVIX) 75 MG tablet Take 75 mg by mouth daily. 07/29/23  Yes [provider]  ondansetron (ZOFRAN-ODT) 4 MG disintegrating tablet Take 4 mg by mouth every 8 (eight) hours as needed. 07/29/23  Yes [provider]  oxyCODONE (OXY IR/ROXICODONE) 5 MG immediate release tablet Take 5 mg by mouth every 4 (four) hours as needed for moderate pain (pain score 4-6) or severe pain (pain score 7-10). 07/29/23  Yes [provider]  torsemide (DEMADEX) 20 MG tablet Take 40 mg by mouth daily. 07/30/23  Yes [provider]  diazepam (VALIUM) 5 MG tablet Take 1 tablet 30-45 minutes prior to MRI Patient not taking: Reported on 03/19/2023 03/07/23   [name], FNP  hydrOXYzine (ATARAX) 25 MG tablet Take  1-2 tablets (25-50 mg total) by mouth every 6 (six) hours as needed for itching 08/19/23     NIFEdipine (PROCARDIA XL/NIFEDICAL-XL) 90 MG 24 hr tablet Take 1 tablet (90 mg total) by mouth daily. 08/20/23   [name], FNP  sodium zirconium cyclosilicate (LOKELMA) 10 g PACK packet Take 1 packet (10 g) by mouth three times a week on  Mondays, Wednesdays, and Fridays. Dissolve in approximately 3 tablespoons of  water 03/12/23     sodium zirconium cyclosilicate (LOKELMA) 10 g PACK packet Take 10 g (1 packet total) by mouth daily. 08/26/23       Physical Exam: Vitals:   09/18/23 0545 09/18/23 0600 09/18/23 0630 09/18/23 0639  BP: 122/88 132/82 120/88   Pulse: 91 89 96   Resp: 17 10 17    Temp:    97.6 F (36.4 C)  TempSrc:    Oral  SpO2: 99% 100% 99%   Weight:      Height:       Physical Exam Vitals and nursing note reviewed.  Constitutional:      General: She is awake. She is not in acute distress.    Appearance: Normal appearance. She is ill-appearing.  HENT:     Head: Normocephalic.     Nose: No rhinorrhea.     Mouth/Throat:     Mouth: Mucous membranes are moist.  Eyes:     General: No scleral icterus.    Pupils: Pupils are equal, round, and reactive to light.  Neck:     Vascular: No JVD.  Cardiovascular:     Rate and Rhythm: Normal rate and regular rhythm.     Heart sounds: S1 normal and S2 normal.  Pulmonary:     Effort: Pulmonary effort is normal.     Breath sounds: Normal breath sounds. No wheezing, rhonchi or rales.  Abdominal:     General: Bowel sounds are normal. There is no distension.     Palpations: Abdomen is soft.     Tenderness: There is no abdominal tenderness.  Musculoskeletal:     Cervical back: Neck supple.     Right lower leg: No edema.     Left lower leg: No edema.  Skin:    General: Skin is warm and dry.  Neurological:     General: No focal deficit present.     Mental Status: She is alert and oriented to person, place, and time.  Psychiatric:        Mood and Affect: Mood normal.        Behavior: Behavior normal. Behavior is cooperative.     Data Reviewed:  Results are pending, will review when available.  EKG: Vent. rate 87 BPM PR interval 156 ms QRS duration 77 ms QT/QTcB 312/376 ms P-R-T axes 49 23 57 Sinus rhythm  Assessment and Plan: Principal Problem:   Compensated metabolic acidosis In the setting of:   CKD (chronic kidney  disease), stage IV (HCC) Presenting with:   Hyperkalemia Observation/telemetry. Received sodium bicarb 50 mEq IVP per nephrology. Continue Lokelma 10 g 3 times daily per nephrology. Follow-up potassium level every 4 hours. Nephrology input appreciated. -Will follow the recommendations.  Active Problems:   Essential hypertension Normotensive at the moment.   Will hold antihypertensives until seen by nephrology. Monitor blood pressure.    Leiomyosarcoma of retroperitoneum (HCC) Status post-nephrectomy in January. Follow-up with GYN oncology at [facility].    Thrombocytosis Secondary/reactive to anemia.    Anemia in chronic renal disease Monitor hematocrit and hemoglobin.  Erythropoietin per nephrology as needed.     Advance Care Planning:   Code Status: Full Code   Consults: Nephrology Delano Metz, MD).  Family Communication:   Severity of Illness: The appropriate patient status for this patient is OBSERVATION. Observation status is judged to be reasonable and necessary in order to provide the required intensity of service to ensure the patient's safety. The patient's presenting symptoms, physical exam findings, and initial radiographic and laboratory data in the context of their medical condition is felt to place them at decreased risk for further clinical deterioration. Furthermore, it is anticipated that the patient will be medically stable for discharge from the hospital within 2 midnights of admission.   Author: Bobette Mo, MD 09/18/2023 8:35 AM  For on call review [email or web address].   This document was prepared using Dragon voice recognition software and may contain some unintended transcription errors.

## 2023-09-18 NOTE — Progress Notes (Signed)
  Carryover admission to the Day Admitter at [facility].  I discussed this case with the EDP, Dr. Andria Meuse.  Per these discussions:   This is a 58 year old female with history of stage IV CKD, left nephrectomy in January 2025 in the setting of leiomyosarcoma, who is being admitted to [facility] with hyperkalemia, which was identified on outpatient labs on 09/17/2023, performed by her outpatient [facility] nephrologist.   The patient is without acute complaint, he continues to produce urine.  Her creatinine is at baseline in the ED this evening, but potassium level was found to be 7.0.  Not associate with any EKG changes.  She has received Lokelma, calcium gluconate, as well as albuterol as well as IV NovoLog/amp of D50.   I have asked EDP at [facility] to please contact on-call nephrology for consult.   I have placed an order for repeat BMP in the morning.   I have placed some additional preliminary admit orders via the adult multi-morbid admission order set. I have also ordered normal saline at 75 cc/h x 10 hours.    Newton Pigg, DO Hospitalist

## 2023-09-19 ENCOUNTER — Other Ambulatory Visit (HOSPITAL_COMMUNITY): Payer: Self-pay

## 2023-09-19 ENCOUNTER — Other Ambulatory Visit: Payer: Self-pay

## 2023-09-19 DIAGNOSIS — E875 Hyperkalemia: Secondary | ICD-10-CM | POA: Diagnosis not present

## 2023-09-19 LAB — BASIC METABOLIC PANEL
Anion gap: 8 (ref 5–15)
BUN: 46 mg/dL — ABNORMAL HIGH (ref 6–20)
CO2: 18 mmol/L — ABNORMAL LOW (ref 22–32)
Calcium: 8.8 mg/dL — ABNORMAL LOW (ref 8.9–10.3)
Chloride: 108 mmol/L (ref 98–111)
Creatinine, Ser: 3.35 mg/dL — ABNORMAL HIGH (ref 0.44–1.00)
GFR, Estimated: 15 mL/min — ABNORMAL LOW (ref >60)
Glucose, Bld: 103 mg/dL — ABNORMAL HIGH (ref 70–99)
Potassium: 5.3 mmol/L — ABNORMAL HIGH (ref 3.5–5.1)
Sodium: 134 mmol/L — ABNORMAL LOW (ref 135–145)

## 2023-09-19 MED ORDER — SODIUM BICARBONATE 650 MG PO TABS
650.0000 mg | ORAL_TABLET | Freq: Three times a day (TID) | ORAL | 0 refills | Status: DC
Start: 2023-09-19 — End: 2023-09-19
  Filled 2023-09-19 (×2): qty 90, 30d supply, fill #0

## 2023-09-19 MED ORDER — SODIUM ZIRCONIUM CYCLOSILICATE 10 G PO PACK
10.0000 g | PACK | Freq: Every day | ORAL | Status: DC
  Filled 2023-09-19: qty 1

## 2023-09-19 MED ORDER — SODIUM BICARBONATE 650 MG PO TABS
650.0000 mg | ORAL_TABLET | Freq: Three times a day (TID) | ORAL | 0 refills | Status: AC
  Filled 2023-09-19: qty 90, 30d supply, fill #0

## 2023-09-19 MED ORDER — LOKELMA 10 G PO PACK
1.0000 | PACK | Freq: Every day | ORAL | 0 refills | Status: AC
Start: 2023-09-19 — End: 2023-10-06
  Filled 2023-09-19: qty 3, 3d supply, fill #0
  Filled 2023-09-19: qty 11, 11d supply, fill #0

## 2023-09-19 MED ORDER — LOKELMA 10 G PO PACK
1.0000 | PACK | Freq: Every day | ORAL | 0 refills | Status: DC
  Filled 2023-09-19 (×2): qty 14, 14d supply, fill #0

## 2023-09-19 NOTE — Progress Notes (Signed)
   09/19/23 1254  TOC Brief Assessment  Insurance and Status Reviewed  Patient has primary care physician Yes  Home environment has been reviewed Home  Prior level of function: Independent  Prior/Current Home Services No current home services  Social Drivers of Health Review SDOH reviewed no interventions necessary  Readmission risk has been reviewed Yes  Transition of care needs no transition of care needs at this time

## 2023-09-19 NOTE — Plan of Care (Signed)
   Problem: Nutrition: Goal: Adequate nutrition will be maintained Outcome: Progressing   Problem: Elimination: Goal: Will not experience complications related to urinary retention Outcome: Progressing

## 2023-09-19 NOTE — Discharge Summary (Signed)
 Physician Discharge Summary  Colleen Potter VWU:[id number] DOB: 07/11/1966 DOA: 09/18/2023  PCP: [name], FNP  Admit date: 09/18/2023 Discharge date: 09/19/2023  Time spent: 40 minutes  Recommendations for Outpatient Follow-up:  Follow outpatient CBC/CMP  Consider refill of lokelma outpatient based on trend Discharged with bicarb, follow outpatient   Discharge Diagnoses:  Principal Problem:   Hyperkalemia Active Problems:   Essential hypertension   Leiomyosarcoma of retroperitoneum (HCC)   CKD (chronic kidney disease), stage IV (HCC)   Thrombocytosis   Anemia in chronic renal disease   Compensated metabolic acidosis   Discharge Condition: stable  Diet recommendation: renal  Filed Weights   09/18/23 0035 09/19/23 0550  Weight: 83 kg 85.7 kg    History of present illness:   58 yo with hx stage IV CKD, right ankle fracture, hypertension who presented to the ED in the setting of hyperkalemia.  Improved with lokelma/IV bicarb.   Stable for discharge 3/7.   Hospital Course:  Assessment and Plan:  Hyperkalemia Chronic Kidney Disease Stage IV Non Anion Gap Metabolic Acidosis Improved with IV bicarb, lokelma Will discharge with lokelma daily She has follow up with renal on 3/20, encouraged PCP follow up for labs within 1 week Will also prescribe bicarb  Hypertension Resume home nifedipine  Anemia Likely due to CKD Follow outpatient  Thrombocytosis Noted  Leiomyosarcoma of the Retroperitoneum S/p nephrectomy in Jan Follows with gyn onc at [facility]    Procedures: none   Consultations: none  Discharge Exam: Vitals:   09/18/23 2240 09/19/23 0245  BP: 137/74 124/78  Pulse: 72 68  Resp: 20 19  Temp: 98.7 F (37.1 C) 98.1 F (36.7 C)  SpO2: 100% 100%   No complaints Eager to discharge  General: No acute distress. Cardiovascular: RRR Lungs: unlabored Abdomen: Soft, nontender, nondistended  Neurological: Alert and oriented 3. Moves all  extremities 4 with equal strength. Cranial nerves II through XII grossly intact. Extremities: No clubbing or cyanosis. No edema.   Discharge Instructions   Discharge Instructions     Call MD for:  difficulty breathing, headache or visual disturbances   Complete by: As directed    Call MD for:  extreme fatigue   Complete by: As directed    Call MD for:  hives   Complete by: As directed    Call MD for:  persistant dizziness or light-headedness   Complete by: As directed    Call MD for:  persistant nausea and vomiting   Complete by: As directed    Call MD for:  redness, tenderness, or signs of infection (pain, swelling, redness, odor or green/yellow discharge around incision site)   Complete by: As directed    Call MD for:  severe uncontrolled pain   Complete by: As directed    Call MD for:  temperature >100.4   Complete by: As directed    Diet - low sodium heart healthy   Complete by: As directed    Discharge instructions   Complete by: As directed    You were seen for abnormal labs (hyperkalemia or high potassium levels).  This is related to your chronic kidney disease.    We'll restart you on lokelma.  Take this daily.  Please ask your PCP to repeat labs within 1 week.  Follow up with the kidney doctors as scheduled for your follow up apppointment to determine if this is something you'll continue long term.    I'll start you on sodium bicarbonate for your chronic kidney disease.  Return for new, recurrent or worsening symptoms.  Please ask your PCP to request records from this hospitalization so they know what was done and what the next steps will be.   Increase activity slowly   Complete by: As directed       Allergies as of 09/19/2023   No Known Allergies      Medication List     TAKE these medications    Lokelma 10 g Pack packet Generic drug: sodium zirconium cyclosilicate Take 10 g (1 packet total) by mouth daily for 14 days. Repeat labs within 1 week with  your PCP.  Follow up with your kidney doctor as scheduled to determine if you need refills. What changed: additional instructions   NIFEdipine 90 MG 24 hr tablet Commonly known as: PROCARDIA XL/NIFEDICAL-XL Take 1 tablet (90 mg total) by mouth daily.   sodium bicarbonate 650 MG tablet Take 1 tablet (650 mg total) by mouth 3 (three) times daily. Follow up with the kidney doctors to determine if you need refills       No Known Allergies    The results of significant diagnostics from this hospitalization (including imaging, microbiology, ancillary and laboratory) are listed below for reference.    Significant Diagnostic Studies: US RENAL Result Date: 09/18/2023 CLINICAL DATA:  Chronic kidney disease, history of right nephrectomy EXAM: RENAL / URINARY TRACT ULTRASOUND COMPLETE COMPARISON:  MRI 03/20/2023 FINDINGS: Right Kidney: Absent Left Kidney: Renal measurements: 9.4 x 5.7 x 4.2 cm = volume: 118 mL. Echogenicity within normal limits. No mass or hydronephrosis visualized. Bladder: Appears normal for degree of bladder distention. Other: None. IMPRESSION: 1. Normal sonographic appearance of the left kidney. 2. Status post right nephrectomy. Electronically Signed   By: Helyn Numbers M.D.   On: 09/18/2023 21:11    Microbiology: No results found for this or any previous visit (from the past 240 hours).   Labs: Basic Metabolic Panel: Recent Labs  Lab 09/18/23 0112 09/18/23 1057 09/18/23 1729 09/19/23 0050  NA 134* 137  --  134*  K 7.0* 4.2 6.0* 5.3*  CL 109 118*  --  108  CO2 15* 14*  --  18*  GLUCOSE 110* 107*  --  103*  BUN 56* 41*  --  46*  CREATININE 4.15* 2.86*  --  3.35*  CALCIUM 9.6 6.8*  --  8.8*   Liver Function Tests: No results for input(s): "AST", "ALT", "ALKPHOS", "BILITOT", "PROT", "ALBUMIN" in the last 168 hours. No results for input(s): "LIPASE", "AMYLASE" in the last 168 hours. No results for input(s): "AMMONIA" in the last 168 hours. CBC: Recent Labs  Lab  09/18/23 0112  WBC 9.0  NEUTROABS 5.8  HGB 10.0*  HCT 31.8*  MCV 93.8  PLT 433*   Cardiac Enzymes: No results for input(s): "CKTOTAL", "CKMB", "CKMBINDEX", "TROPONINI" in the last 168 hours. BNP: BNP (last 3 results) No results for input(s): "BNP" in the last 8760 hours.  ProBNP (last 3 results) No results for input(s): "PROBNP" in the last 8760 hours.  CBG: Recent Labs  Lab 09/18/23 0330  GLUCAP 106*       Signed:  Lacretia Nicks MD.  [facility] 09/19/2023, 12:49 PM

## 2023-09-21 DIAGNOSIS — R001 Bradycardia, unspecified: Secondary | ICD-10-CM | POA: Diagnosis not present

## 2023-09-22 ENCOUNTER — Other Ambulatory Visit: Payer: Self-pay

## 2023-09-23 ENCOUNTER — Other Ambulatory Visit: Payer: Self-pay | Admitting: Family

## 2023-09-23 ENCOUNTER — Telehealth: Payer: Self-pay | Admitting: Family

## 2023-09-23 ENCOUNTER — Other Ambulatory Visit (INDEPENDENT_AMBULATORY_CARE_PROVIDER_SITE_OTHER)

## 2023-09-23 DIAGNOSIS — R899 Unspecified abnormal finding in specimens from other organs, systems and tissues: Secondary | ICD-10-CM | POA: Diagnosis not present

## 2023-09-23 LAB — CBC WITH DIFFERENTIAL/PLATELET
Basophils Absolute: 0.1 10*3/uL (ref 0.0–0.1)
Basophils Relative: 1 % (ref 0.0–3.0)
Eosinophils Absolute: 0.2 10*3/uL (ref 0.0–0.7)
Eosinophils Relative: 3.3 % (ref 0.0–5.0)
HCT: 28.6 % — ABNORMAL LOW (ref 36.0–46.0)
Hemoglobin: 9.2 g/dL — ABNORMAL LOW (ref 12.0–15.0)
Lymphocytes Relative: 27.7 % (ref 12.0–46.0)
Lymphs Abs: 1.9 10*3/uL (ref 0.7–4.0)
MCHC: 32.3 g/dL (ref 30.0–36.0)
MCV: 91 fl (ref 78.0–100.0)
Monocytes Absolute: 0.3 10*3/uL (ref 0.1–1.0)
Monocytes Relative: 4 % (ref 3.0–12.0)
Neutro Abs: 4.5 10*3/uL (ref 1.4–7.7)
Neutrophils Relative %: 64 % (ref 43.0–77.0)
Platelets: 381 10*3/uL (ref 150.0–400.0)
RBC: 3.14 Mil/uL — ABNORMAL LOW (ref 3.87–5.11)
RDW: 15.1 % (ref 11.5–15.5)
WBC: 6.9 10*3/uL (ref 4.0–10.5)

## 2023-09-23 LAB — COMPREHENSIVE METABOLIC PANEL
ALT: 6 U/L (ref 0–35)
AST: 7 U/L (ref 0–37)
Albumin: 4.2 g/dL (ref 3.5–5.2)
Alkaline Phosphatase: 49 U/L (ref 39–117)
BUN: 30 mg/dL — ABNORMAL HIGH (ref 6–23)
CO2: 26 meq/L (ref 19–32)
Calcium: 9.4 mg/dL (ref 8.4–10.5)
Chloride: 105 meq/L (ref 96–112)
Creatinine, Ser: 3.1 mg/dL — ABNORMAL HIGH (ref 0.40–1.20)
GFR: 16.11 mL/min — ABNORMAL LOW (ref >60.00)
Glucose, Bld: 91 mg/dL (ref 70–99)
Potassium: 4.3 meq/L (ref 3.5–5.1)
Sodium: 139 meq/L (ref 135–145)
Total Bilirubin: 0.3 mg/dL (ref 0.2–1.2)
Total Protein: 7.4 g/dL (ref 6.0–8.3)

## 2023-09-23 NOTE — Telephone Encounter (Signed)
 Patient is scheduled today for repeat CBC and CMP after recent hospitalization. She was directed to [facility] by nephrology for admission when potassium was found to be too high. Per patient, she will be going to nephrology ( Dr. Thedore Mins) here for follow up next week on 3/20. We will update the labs as requested after hospital discharge but she is aware that we will not be taking over management of these labs/ results. Results will be send to her provider at [facility]) and Dr. Thedore Mins at [facility] here in [place].

## 2023-09-24 DIAGNOSIS — I129 Hypertensive chronic kidney disease with stage 1 through stage 4 chronic kidney disease, or unspecified chronic kidney disease: Secondary | ICD-10-CM | POA: Diagnosis not present

## 2023-09-24 DIAGNOSIS — N184 Chronic kidney disease, stage 4 (severe): Secondary | ICD-10-CM | POA: Diagnosis not present

## 2023-09-24 DIAGNOSIS — R809 Proteinuria, unspecified: Secondary | ICD-10-CM | POA: Diagnosis not present

## 2023-09-24 DIAGNOSIS — D631 Anemia in chronic kidney disease: Secondary | ICD-10-CM | POA: Diagnosis not present

## 2023-09-24 DIAGNOSIS — C499 Malignant neoplasm of connective and soft tissue, unspecified: Secondary | ICD-10-CM | POA: Diagnosis not present

## 2023-09-24 DIAGNOSIS — Z905 Acquired absence of kidney: Secondary | ICD-10-CM | POA: Diagnosis not present

## 2023-09-24 DIAGNOSIS — E875 Hyperkalemia: Secondary | ICD-10-CM | POA: Diagnosis not present

## 2023-09-25 ENCOUNTER — Other Ambulatory Visit (HOSPITAL_COMMUNITY): Payer: Self-pay

## 2023-10-01 DIAGNOSIS — N184 Chronic kidney disease, stage 4 (severe): Secondary | ICD-10-CM | POA: Diagnosis not present

## 2023-10-20 ENCOUNTER — Ambulatory Visit
Admission: RE | Admit: 2023-10-20 | Discharge: 2023-10-20 | Disposition: A | Discharge status: Home / Self Care | Source: Ambulatory Visit | Attending: Family | Admitting: Family

## 2023-10-20 DIAGNOSIS — Z1231 Encounter for screening mammogram for malignant neoplasm of breast: Secondary | ICD-10-CM | POA: Diagnosis not present

## 2023-11-19 ENCOUNTER — Inpatient Hospital Stay (HOSPITAL_COMMUNITY)
Admission: EM | Admit: 2023-11-19 | Discharge: 2023-11-21 | DRG: 683 | Disposition: A | Discharge status: Home / Self Care | Attending: Internal Medicine | Admitting: Internal Medicine

## 2023-11-19 ENCOUNTER — Encounter (HOSPITAL_COMMUNITY): Payer: Self-pay | Admitting: Emergency Medicine

## 2023-11-19 ENCOUNTER — Emergency Department (HOSPITAL_COMMUNITY)

## 2023-11-19 ENCOUNTER — Other Ambulatory Visit: Payer: Self-pay

## 2023-11-19 ENCOUNTER — Telehealth: Payer: Self-pay

## 2023-11-19 DIAGNOSIS — I129 Hypertensive chronic kidney disease with stage 1 through stage 4 chronic kidney disease, or unspecified chronic kidney disease: Secondary | ICD-10-CM | POA: Diagnosis not present

## 2023-11-19 DIAGNOSIS — I10 Essential (primary) hypertension: Secondary | ICD-10-CM | POA: Diagnosis not present

## 2023-11-19 DIAGNOSIS — R739 Hyperglycemia, unspecified: Secondary | ICD-10-CM | POA: Diagnosis present

## 2023-11-19 DIAGNOSIS — N179 Acute kidney failure, unspecified: Principal | ICD-10-CM | POA: Diagnosis present

## 2023-11-19 DIAGNOSIS — C480 Malignant neoplasm of retroperitoneum: Secondary | ICD-10-CM | POA: Diagnosis present

## 2023-11-19 DIAGNOSIS — N184 Chronic kidney disease, stage 4 (severe): Secondary | ICD-10-CM | POA: Diagnosis present

## 2023-11-19 DIAGNOSIS — T50996A Underdosing of other drugs, medicaments and biological substances, initial encounter: Secondary | ICD-10-CM | POA: Diagnosis present

## 2023-11-19 DIAGNOSIS — R0600 Dyspnea, unspecified: Secondary | ICD-10-CM | POA: Diagnosis not present

## 2023-11-19 DIAGNOSIS — E872 Acidosis, unspecified: Secondary | ICD-10-CM | POA: Diagnosis present

## 2023-11-19 DIAGNOSIS — D649 Anemia, unspecified: Secondary | ICD-10-CM | POA: Diagnosis not present

## 2023-11-19 DIAGNOSIS — J449 Chronic obstructive pulmonary disease, unspecified: Secondary | ICD-10-CM | POA: Diagnosis not present

## 2023-11-19 DIAGNOSIS — N250 Renal osteodystrophy: Secondary | ICD-10-CM | POA: Diagnosis not present

## 2023-11-19 DIAGNOSIS — Z905 Acquired absence of kidney: Secondary | ICD-10-CM | POA: Diagnosis not present

## 2023-11-19 DIAGNOSIS — Z91148 Patient's other noncompliance with medication regimen for other reason: Secondary | ICD-10-CM | POA: Diagnosis not present

## 2023-11-19 DIAGNOSIS — E875 Hyperkalemia: Principal | ICD-10-CM | POA: Diagnosis present

## 2023-11-19 DIAGNOSIS — Z91138 Patient's unintentional underdosing of medication regimen for other reason: Secondary | ICD-10-CM | POA: Diagnosis not present

## 2023-11-19 DIAGNOSIS — R0602 Shortness of breath: Secondary | ICD-10-CM | POA: Diagnosis not present

## 2023-11-19 DIAGNOSIS — D631 Anemia in chronic kidney disease: Secondary | ICD-10-CM | POA: Diagnosis not present

## 2023-11-19 DIAGNOSIS — R0989 Other specified symptoms and signs involving the circulatory and respiratory systems: Secondary | ICD-10-CM | POA: Diagnosis present

## 2023-11-19 LAB — COMPREHENSIVE METABOLIC PANEL WITH GFR
ALT: 8 U/L (ref 0–44)
AST: 11 U/L — ABNORMAL LOW (ref 15–41)
Albumin: 3.6 g/dL (ref 3.5–5.0)
Alkaline Phosphatase: 38 U/L (ref 38–126)
Anion gap: 6 (ref 5–15)
BUN: 63 mg/dL — ABNORMAL HIGH (ref 6–20)
CO2: 14 mmol/L — ABNORMAL LOW (ref 22–32)
Calcium: 8.4 mg/dL — ABNORMAL LOW (ref 8.9–10.3)
Chloride: 111 mmol/L (ref 98–111)
Creatinine, Ser: 4.37 mg/dL — ABNORMAL HIGH (ref 0.44–1.00)
GFR, Estimated: 11 mL/min — ABNORMAL LOW (ref >60)
Glucose, Bld: 247 mg/dL — ABNORMAL HIGH (ref 70–99)
Potassium: >7.5 mmol/L (ref 3.5–5.1)
Sodium: 131 mmol/L — ABNORMAL LOW (ref 135–145)
Total Bilirubin: 0.6 mg/dL (ref 0.0–1.2)
Total Protein: 7.1 g/dL (ref 6.5–8.1)

## 2023-11-19 LAB — URINALYSIS, COMPLETE (UACMP) WITH MICROSCOPIC
Bilirubin Urine: NEGATIVE
Glucose, UA: 50 mg/dL — AB
Hgb urine dipstick: NEGATIVE
Ketones, ur: NEGATIVE mg/dL
Nitrite: NEGATIVE
Protein, ur: NEGATIVE mg/dL
Specific Gravity, Urine: 1.006 (ref 1.005–1.030)
pH: 5 (ref 5.0–8.0)

## 2023-11-19 LAB — OSMOLALITY: Osmolality: 309 mosm/kg — ABNORMAL HIGH (ref 275–295)

## 2023-11-19 LAB — BASIC METABOLIC PANEL WITH GFR
Anion gap: 8 (ref 5–15)
BUN: 62 mg/dL — ABNORMAL HIGH (ref 6–20)
CO2: 16 mmol/L — ABNORMAL LOW (ref 22–32)
Calcium: 9.3 mg/dL (ref 8.9–10.3)
Chloride: 110 mmol/L (ref 98–111)
Creatinine, Ser: 4.25 mg/dL — ABNORMAL HIGH (ref 0.44–1.00)
GFR, Estimated: 12 mL/min — ABNORMAL LOW (ref >60)
Glucose, Bld: 84 mg/dL (ref 70–99)
Potassium: 5.2 mmol/L — ABNORMAL HIGH (ref 3.5–5.1)
Sodium: 134 mmol/L — ABNORMAL LOW (ref 135–145)

## 2023-11-19 LAB — TROPONIN I (HIGH SENSITIVITY)
Troponin I (High Sensitivity): 3 ng/L (ref <18)
Troponin I (High Sensitivity): 3 ng/L

## 2023-11-19 LAB — I-STAT CHEM 8, ED
BUN: 69 mg/dL — ABNORMAL HIGH (ref 6–20)
Calcium, Ion: 1.3 mmol/L (ref 1.15–1.40)
Chloride: 118 mmol/L — ABNORMAL HIGH (ref 98–111)
Creatinine, Ser: 4.9 mg/dL — ABNORMAL HIGH (ref 0.44–1.00)
Glucose, Bld: 90 mg/dL (ref 70–99)
HCT: 37 % (ref 36.0–46.0)
Hemoglobin: 12.6 g/dL (ref 12.0–15.0)
Potassium: 7.9 mmol/L (ref 3.5–5.1)
Sodium: 134 mmol/L — ABNORMAL LOW (ref 135–145)
TCO2: 14 mmol/L — ABNORMAL LOW (ref 22–32)

## 2023-11-19 LAB — CREATININE, URINE, RANDOM: Creatinine, Urine: 31 mg/dL

## 2023-11-19 LAB — CBC WITH DIFFERENTIAL/PLATELET
Abs Immature Granulocytes: 0.02 10*3/uL (ref 0.00–0.07)
Basophils Absolute: 0.1 10*3/uL (ref 0.0–0.1)
Basophils Relative: 1 %
Eosinophils Absolute: 0 10*3/uL (ref 0.0–0.5)
Eosinophils Relative: 0 %
HCT: 36.8 % (ref 36.0–46.0)
Hemoglobin: 11.3 g/dL — ABNORMAL LOW (ref 12.0–15.0)
Immature Granulocytes: 0 %
Lymphocytes Relative: 30 %
Lymphs Abs: 1.8 10*3/uL (ref 0.7–4.0)
MCH: 29.4 pg (ref 26.0–34.0)
MCHC: 30.7 g/dL (ref 30.0–36.0)
MCV: 95.6 fL (ref 80.0–100.0)
Monocytes Absolute: 0.3 10*3/uL (ref 0.1–1.0)
Monocytes Relative: 5 %
Neutro Abs: 3.8 10*3/uL (ref 1.7–7.7)
Neutrophils Relative %: 64 %
Platelets: 383 10*3/uL (ref 150–400)
RBC: 3.85 MIL/uL — ABNORMAL LOW (ref 3.87–5.11)
RDW: 13.7 % (ref 11.5–15.5)
WBC: 6.1 10*3/uL (ref 4.0–10.5)
nRBC: 0 % (ref 0.0–0.2)

## 2023-11-19 LAB — CBG MONITORING, ED: Glucose-Capillary: 81 mg/dL (ref 70–99)

## 2023-11-19 LAB — MAGNESIUM: Magnesium: 2 mg/dL (ref 1.7–2.4)

## 2023-11-19 LAB — OSMOLALITY, URINE: Osmolality, Ur: 328 mosm/kg (ref 300–900)

## 2023-11-19 LAB — BRAIN NATRIURETIC PEPTIDE: B Natriuretic Peptide: 18.3 pg/mL (ref 0.0–100.0)

## 2023-11-19 LAB — PHOSPHORUS: Phosphorus: 4.5 mg/dL (ref 2.5–4.6)

## 2023-11-19 LAB — SODIUM, URINE, RANDOM: Sodium, Ur: 120 mmol/L

## 2023-11-19 LAB — CK: Total CK: 55 U/L (ref 38–234)

## 2023-11-19 LAB — TSH: TSH: 0.531 u[IU]/mL (ref 0.350–4.500)

## 2023-11-19 MED ORDER — SODIUM BICARBONATE 8.4 % IV SOLN
50.0000 meq | Freq: Once | INTRAVENOUS | Status: AC
  Administered 2023-11-19: 50 meq via INTRAVENOUS
  Filled 2023-11-19: qty 50

## 2023-11-19 MED ORDER — INSULIN ASPART 100 UNIT/ML IJ SOLN
0.0000 [IU] | INTRAMUSCULAR | Status: DC
  Filled 2023-11-19: qty 0.06

## 2023-11-19 MED ORDER — SODIUM ZIRCONIUM CYCLOSILICATE 5 G PO PACK
5.0000 g | PACK | Freq: Every day | ORAL | Status: DC
  Administered 2023-11-20: 5 g via ORAL
  Filled 2023-11-19: qty 1

## 2023-11-19 MED ORDER — SODIUM ZIRCONIUM CYCLOSILICATE 10 G PO PACK
10.0000 g | PACK | Freq: Once | ORAL | Status: AC
  Administered 2023-11-19: 10 g via ORAL
  Filled 2023-11-19: qty 1

## 2023-11-19 MED ORDER — DEXTROSE 50 % IV SOLN
50.0000 mL | Freq: Once | INTRAVENOUS | Status: AC
  Administered 2023-11-19: 50 mL via INTRAVENOUS
  Filled 2023-11-19: qty 50

## 2023-11-19 MED ORDER — SODIUM CHLORIDE 0.9 % IV BOLUS
1000.0000 mL | Freq: Once | INTRAVENOUS | Status: AC
  Administered 2023-11-19: 1000 mL via INTRAVENOUS

## 2023-11-19 MED ORDER — CHLORHEXIDINE GLUCONATE CLOTH 2 % EX PADS: 6.0000 | MEDICATED_PAD | Freq: Every day | CUTANEOUS | Status: DC

## 2023-11-19 MED ORDER — CALCIUM GLUCONATE-NACL 1-0.675 GM/50ML-% IV SOLN
1.0000 g | Freq: Once | INTRAVENOUS | Status: AC
  Administered 2023-11-19: 1000 mg via INTRAVENOUS
  Filled 2023-11-19: qty 50

## 2023-11-19 MED ORDER — FUROSEMIDE 10 MG/ML IJ SOLN
80.0000 mg | Freq: Once | INTRAMUSCULAR | Status: AC
  Administered 2023-11-19: 80 mg via INTRAVENOUS
  Filled 2023-11-19 (×2): qty 8

## 2023-11-19 MED ORDER — FUROSEMIDE 10 MG/ML IJ SOLN
120.0000 mg | Freq: Once | INTRAVENOUS | Status: AC
  Administered 2023-11-19: 120 mg via INTRAVENOUS
  Filled 2023-11-19: qty 10

## 2023-11-19 MED ORDER — ALBUTEROL SULFATE (2.5 MG/3ML) 0.083% IN NEBU
2.5000 mg | INHALATION_SOLUTION | RESPIRATORY_TRACT | Status: AC
Start: 2023-11-19 — End: 2023-11-19
  Administered 2023-11-19: 2.5 mg via RESPIRATORY_TRACT
  Filled 2023-11-19: qty 3

## 2023-11-19 MED ORDER — SODIUM CHLORIDE 0.9 % IV BOLUS: 1000.0000 mL | Freq: Once | INTRAVENOUS | Status: DC

## 2023-11-19 MED ORDER — INSULIN ASPART 100 UNIT/ML IJ SOLN
2.0000 [IU] | Freq: Once | INTRAMUSCULAR | Status: AC
  Administered 2023-11-19: 2 [IU] via SUBCUTANEOUS
  Filled 2023-11-19: qty 0.02

## 2023-11-19 MED ORDER — ORAL CARE MOUTH RINSE: 15.0000 mL | OROMUCOSAL | Status: DC | PRN

## 2023-11-19 NOTE — Assessment & Plan Note (Signed)
 Acute on chronic unclear if worsening of a chronic condition versus acute change.  Will rehydrate Correct hyperkalemia patient will likely need to be on chronic Lokelma 

## 2023-11-19 NOTE — Assessment & Plan Note (Addendum)
 Creatinine Up from baseline  Appreciate nephrology consult  -chronic avoid nephrotoxic medications such as NSAIDs, Vanco Zosyn combo,  avoid hypotension, continue to follow renal function

## 2023-11-19 NOTE — Subjective & Objective (Signed)
 Reports fatigue and shortness of breath, worried her potassium is abnormal, has hx of CKD and HTN Hx of Hyperkalemia in the past, has not been taking her meds for this. Found to have K of 7.9 and bicarb of 14 Nephrology was consulted case discussed with Dr. Higinio Love  In ER was given calcium  gluconate, insulin , D50, Lokelma , Lasix 80 mg , 1 L NS, and 50 mEq of bicarb

## 2023-11-19 NOTE — H&P (Addendum)
 Colleen Potter:[id number] DOB: 01-24-1966 DOA: 11/19/2023     PCP: Adra Alanis, FNP   Outpatient Specialists:      NEphrology:  Dr.  Zelda Hickman    Patient arrived to ER on 11/19/23 at 1540 Referred by Attending Dalene Duck, MD   Patient coming from:    home Lives  With family   Chief Complaint:   Chief Complaint  Patient presents with   Shortness of Breath    HPI: Colleen Potter is a 58 y.o. female with medical history significant of CKD stage IV, HTN, hyperkalemia, sp right nephrectomy due to renal mass consistent with leiomyosarcoma  Presented with   weakness Reports fatigue and shortness of breath, worried her potassium is abnormal, has hx of CKD and HTN Hx of Hyperkalemia in the past, has not been taking her meds for this. Found to have K of 7.9 and bicarb of 14 Nephrology was consulted case discussed with Dr. Higinio Love  In ER was given calcium  gluconate, insulin , D50, Lokelma , Lasix 80 mg , 1 L NS, and 50 mEq of bicarb Patient denies any chest pain Reports her shortness of breath has been mainly exertional.  Now resolved. Have had hypokalemia in the past was supposed to be on Lokelma  She ran out Patient was trying to have a follow-up appointment but unfortunately came in late in her appointment was canceled she has not been able to reschedule it and her labs has not been checked.   Patient works for [employer] she is very concerned regarding people looking at her records for that reason is reluctant to be transferred to [facility].  Discussed at length that we take HIPAA compliance very seriously.  Offered patient to have her records added additional securityXXX status.  Patient still declines.  At this time she is willing to wait regarding her next lab draw on to make a decision whether she would be amendable to be transferred to [facility].  Denies significant ETOH intake   Does not smoke       Regarding pertinent Chronic problems:       HTN on  nifedipine      CKD stage IV  baseline Cr 3.1 CrCl cannot be calculated (Unknown ideal weight.).  Lab Results  Component Value Date   CREATININE 4.90 (H) 11/19/2023   CREATININE 3.10 (H) 09/23/2023   CREATININE 3.35 (H) 09/19/2023   Lab Results  Component Value Date   NA 134 (L) 11/19/2023   CL 118 (H) 11/19/2023   K 7.9 (HH) 11/19/2023   CO2 26 09/23/2023   BUN 69 (H) 11/19/2023   CREATININE 4.90 (H) 11/19/2023   GFR 16.11 (L) 09/23/2023   CALCIUM  9.4 09/23/2023   ALBUMIN 4.2 09/23/2023   GLUCOSE 90 11/19/2023      Chronic anemia - baseline hg Hemoglobin & Hematocrit  Recent Labs    09/23/23 0945 11/19/23 1610 11/19/23 1852  HGB 9.2* 11.3* 12.6   Iron/TIBC/Ferritin/ %Sat No results found for: "IRON", "TIBC", "FERRITIN", "IRONPCTSAT"    Cancer: Leiomyosarcoma of retroperitoneum sp ressection    While in ER:    K 7.9 sp treatment Nephrology consulted    Lab Orders         CBC with Differential         TSH         Brain natriuretic peptide         Comprehensive metabolic panel with GFR         Magnesium  Comprehensive metabolic panel         I-stat chem 8, ED (not at [facility], [facility] or [facility])       CXR -  Mild COPD changes with hyperinflation both lung bases and flattening of the diaphragms    Following Medications were ordered in ER: Medications  calcium  gluconate 1 g/ 50 mL sodium chloride  IVPB (1,000 mg Intravenous New Bag/Given 11/19/23 1930)  sodium chloride  0.9 % bolus 1,000 mL (1,000 mLs Intravenous New Bag/Given 11/19/23 1841)  sodium bicarbonate  injection 50 mEq (50 mEq Intravenous Given 11/19/23 1842)  insulin  aspart (novoLOG ) injection 2 Units (2 Units Subcutaneous Given 11/19/23 1931)  dextrose  50 % solution 50 mL (50 mLs Intravenous Given 11/19/23 1930)  sodium zirconium cyclosilicate  (LOKELMA ) packet 10 g (10 g Oral Given 11/19/23 1928)  furosemide (LASIX) injection 80 mg (80 mg Intravenous Given 11/19/23 1933)     _______________________________________________________ ER Provider Called:     Nephrology   Dr. Higinio Love  They Recommend admit to medicine   Will see in AM        ED Triage Vitals  Encounter Vitals Group     BP 11/19/23 1548 124/84     Systolic BP Percentile --      Diastolic BP Percentile --      Pulse Rate 11/19/23 1548 89     Resp 11/19/23 1548 16     Temp 11/19/23 1548 98.7 F (37.1 C)     Temp Source 11/19/23 1548 Oral     SpO2 11/19/23 1548 100 %     Weight --      Height --      Head Circumference --      Peak Flow --      Pain Score 11/19/23 1606 0     Pain Loc --      Pain Education --      Exclude from Growth Chart --   NGEX(52)@     _________________________________________ Significant initial  Findings: Abnormal Labs Reviewed  CBC WITH DIFFERENTIAL/PLATELET - Abnormal; Notable for the following components:      Result Value   RBC 3.85 (*)    Hemoglobin 11.3 (*)    All other components within normal limits  I-STAT CHEM 8, ED - Abnormal; Notable for the following components:   Sodium 134 (*)    Potassium 7.9 (*)    Chloride 118 (*)    BUN 69 (*)    Creatinine, Ser 4.90 (*)    TCO2 14 (*)    All other components within normal limits      _________________________ Troponin  ordered Cardiac Panel (last 3 results) Recent Labs    11/19/23 1610  TROPONINIHS 3     ECG: Ordered Personally reviewed and interpreted by me showing: HR : 68 Rhythm: Sinus rhythm Minimal ST elevation, inferior leads No significant change since last tracing QTC 352  BNP (last 3 results) Recent Labs    11/19/23 1610  BNP 18.3    The recent clinical data is shown below. Vitals:   11/19/23 1548 11/19/23 1550 11/19/23 1900  BP: 124/84 124/84 132/77  Pulse: 89 85 (!) 56  Resp: 16 18 19   Temp: 98.7 F (37.1 C)    TempSrc: Oral    SpO2: 100% 100% 100%    WBC     Component Value Date/Time   WBC 6.1 11/19/2023 1610   LYMPHSABS 1.8 11/19/2023 1610   MONOABS 0.3  11/19/2023 1610   EOSABS 0.0 11/19/2023 1610  BASOSABS 0.1 11/19/2023 1610          UA   ordered    __________________________________________________________ Recent Labs  Lab 11/19/23 1852  NA 134*  K 7.9*  GLUCOSE 90  BUN 69*  CREATININE 4.90*    Cr   Up from baseline see below Lab Results  Component Value Date   CREATININE 4.90 (H) 11/19/2023   CREATININE 3.10 (H) 09/23/2023   CREATININE 3.35 (H) 09/19/2023    No results for input(s): "AST", "ALT", "ALKPHOS", "BILITOT", "PROT", "ALBUMIN" in the last 168 hours. Lab Results  Component Value Date   CALCIUM  9.4 09/23/2023       Plt: Lab Results  Component Value Date   PLT 383 11/19/2023       Recent Labs  Lab 11/19/23 1610 11/19/23 1852  WBC 6.1  --   NEUTROABS 3.8  --   HGB 11.3* 12.6  HCT 36.8 37.0  MCV 95.6  --   PLT 383  --     HG/HCT  stable,       Component Value Date/Time   HGB 12.6 11/19/2023 1852   HCT 37.0 11/19/2023 1852   MCV 95.6 11/19/2023 1610     _________________________________________ Hospitalist was called for admission for   Hyperkalemia    AKI     The following Work up has been ordered so far:  Orders Placed This Encounter  Procedures   DG Chest 2 View   CBC with Differential   TSH   Brain natriuretic peptide   Comprehensive metabolic panel with GFR   Magnesium   Comprehensive metabolic panel   Document Height and Actual Weight   If O2 Sat <94% administer O2 at 2 liters/minute via nasal cannula   Measure post void residual   If Cath X 3 Or Residual >300 Place Foley   Consult to hospitalist   I-stat chem 8, ED (not at [facility], [facility] or [facility])   ED EKG   EKG 12-Lead     OTHER Significant initial  Findings:  labs showing:     DM  labs:  HbA1C: Recent Labs    12/31/22 0848  HGBA1C 6.1    CBG (last 3)  No results for input(s): "GLUCAP" in the last 72 hours.        Cultures: No results found for: "SDES", "SPECREQUEST", "CULT", "REPTSTATUS"   Radiological  Exams on Admission: DG Chest 2 View Result Date: 11/19/2023 CLINICAL DATA:  Shortness of breath EXAM: CHEST - 2 VIEW COMPARISON:  None Available. FINDINGS: The heart size and mediastinal contours are within normal limits. Both lungs are clear. The visualized skeletal structures are unremarkable. IMPRESSION: No active cardiopulmonary disease. Mild COPD changes with hyperinflation both lung bases and flattening of the diaphragms Electronically Signed   By: Fredrich Jefferson M.D.   On: 11/19/2023 17:09   _______________________________________________________________________________________________________ Latest  Blood pressure 132/77, pulse (!) 56, temperature 98.7 F (37.1 C), temperature source Oral, resp. rate 19, SpO2 100%.   Vitals  labs and radiology finding personally reviewed  Review of Systems:    Pertinent positives include:   fatigue  dyspnea on exertion Constitutional:  No weight loss, night sweats, Fevers, chills,, weight loss  HEENT:  No headaches, Difficulty swallowing,Tooth/dental problems,Sore throat,  No sneezing, itching, ear ache, nasal congestion, post nasal drip,  Cardio-vascular:  No chest pain, Orthopnea, PND, anasarca, dizziness, palpitations.no Bilateral lower extremity swelling  GI:  No heartburn, indigestion, abdominal pain, nausea, vomiting, diarrhea, change in bowel habits, loss of appetite, melena,  blood in stool, hematemesis Resp:  no shortness of breath at rest. No, No excess mucus, no productive cough, No non-productive cough, No coughing up of blood.No change in color of mucus.No wheezing. Skin:  no rash or lesions. No jaundice GU:  no dysuria, change in color of urine, no urgency or frequency. No straining to urinate.  No flank pain.  Musculoskeletal:  No joint pain or no joint swelling. No decreased range of motion. No back pain.  Psych:  No change in mood or affect. No depression or anxiety. No memory loss.  Neuro: no localizing neurological  complaints, no tingling, no weakness, no double vision, no gait abnormality, no slurred speech, no confusion  All systems reviewed and apart from HOPI all are negative _______________________________________________________________________________________________ Past Medical History:   Past Medical History:  Diagnosis Date   Hypertension    states under control with med., has been on med. x 2 yr.   Trimalleolar fracture of ankle, closed 08/03/2016   right      Past Surgical History:  Procedure Laterality Date   NO PAST SURGERIES     ORIF ANKLE FRACTURE Right 08/07/2016   Procedure: OPEN REDUCTION INTERNAL FIXATION (ORIF) RIGHT ANKLE FRACTURE;  Surgeon: Wes Hamman, MD;  Location: [facility];  Service: Orthopedics;  Laterality: Right;   RADIOLOGY WITH ANESTHESIA N/A 03/20/2023   Procedure: MRI OF ABDOMEN WITH AND WITHOUT CONTRAST WITH ANESTHESIA;  Surgeon: Radiologist, Medication, MD;  Location: [facility] OR;  Service: Radiology;  Laterality: N/A;    Social History:  Ambulatory   independently     reports that she has never smoked. She has never used smokeless tobacco. She reports that she does not drink alcohol and does not use drugs.    Family History:   History reviewed. No pertinent family history. ______________________________________________________________________________________________ Allergies: No Known Allergies   Prior to Admission medications   Medication Sig Start Date End Date Taking? Authorizing Provider  NIFEdipine  (PROCARDIA  XL/NIFEDICAL-XL) 90 MG 24 hr tablet Take 1 tablet (90 mg total) by mouth daily. 08/20/23  Yes Adra Alanis, FNP    ___________________________________________________________________________________________________ Physical Exam:    11/19/2023    7:00 PM 11/19/2023    3:50 PM 11/19/2023    3:48 PM  Vitals with BMI  Systolic 132 124 478  Diastolic 77 84 84  Pulse 56 85 89     1. General:  in No  Acute distress     Chronically ill   -appearing 2. Psychological: Alert and   Oriented 3. Head/ENT:    Dry Mucous Membranes                          Head Non traumatic, neck supple                           Poor Dentition 4. SKIN: decreased Skin turgor,  Skin clean Dry and intact no rash    5. Heart: Regular rate and rhythm no  Murmur, no Rub or gallop 6. Lungs:  no wheezes or crackles   7. Abdomen: Soft,  non-tender, Non distended  bowel sounds present 8. Lower extremities: no clubbing, cyanosis, no  edema 9. Neurologically Grossly intact, moving all 4 extremities equally  10. MSK: Normal range of motion    Chart has been reviewed  ______________________________________________________________________________________________  Assessment/Plan 58 y.o. female with medical history significant of CKD stage IV, HTN, hyperkalemia, sp right nephrectomy due  to renal mass consistent with leiomyosarcoma    Admitted for   Hyperkalemia  AKI    Present on Admission:  Hyperkalemia  Anemia in chronic renal disease  CKD (chronic kidney disease), stage IV (HCC)  Essential hypertension  Leiomyosarcoma of retroperitoneum (HCC)  COPD suggested by initial evaluation (HCC)  AKI (acute kidney injury) (HCC)  Hyperglycemia     Anemia in chronic renal disease Obtain anemia panel  CKD (chronic kidney disease), stage IV (HCC) Creatinine Up from baseline  Appreciate nephrology consult  -chronic avoid nephrotoxic medications such as NSAIDs, Vanco Zosyn combo,  avoid hypotension, continue to follow renal function   Essential hypertension Soft blood pressures allow permissive hypertension for now  Hyperkalemia Recurrent patient will likely need to be discharged with Lokelma  prescription Treated aggressively in the emergency department was given calcium  gluconate, insulin , D50, Lokelma , Lasix 80 mg , 1 L NS, and 50 mEq of bicarb , albuterol  nebulizer ordered EKG showing changes worrisome for  hyperkalemia Nephrology is aware at this point hopes that hyperkalemia can be improved with medical interventions Recommend the patient will be transferred to [facility] Which patient at this point adamantly refuses we will continue to discuss with her alternatively may need to be admitted to [facility] stepdown.  Monitor on telemetry Bmet every 4 hours  Leiomyosarcoma of retroperitoneum [facility]) S/p resection  COPD suggested by initial evaluation [facility]) Imaging suggestive of COPD.  Patient denies ever smoking.  She has been having more exertional shortness of breath. Albuterol  as needed Once stable may need to get further evaluation with PFTs as an outpatient  AKI (acute kidney injury) (HCC) Acute on chronic unclear if worsening of a chronic condition versus acute change.  Will rehydrate Correct hyperkalemia patient will likely need to be on chronic Lokelma   Hyperglycemia Patient was given D50 and 3 units of insulin  on repeat labs her blood sugar noted to be at 247.  Patient has no history of diabetes that she is aware of.  Check hemoglobin A1c For tonight order sliding scale and monitor blood sugars carefully May have underlying undiagnosed diabetes     Other plan as per orders.  DVT prophylaxis:  SCD        Code Status:    Code Status: Prior FULL CODE as per patient   I had personally discussed CODE STATUS with patient   ACP   none    Family Communication:   Family not at  Bedside    Diet renal   Disposition Plan:      To home once workup is complete and patient is stable   Following barriers for discharge:                                                      Electrolytes corrected                              set up                           Will need consultants to evaluate patient prior to discharge                          Consult Orders  (  From admission, onward)           Start     Ordered   11/19/23 1858  Consult to hospitalist  Once       Provider:   (Not yet assigned)  Question Answer Comment  Place call to: [facility]   Reason for Consult Admit      11/19/23 1857                               Consults called: NEphrology      Admission status:  ED Disposition     ED Disposition  Admit   Condition  --   Comment  The patient appears reasonably stabilized for admission considering the current resources, flow, and capabilities available in the ED at this time, and I doubt any other [facility] requiring further screening and/or treatment in the ED prior to admission is  present.            inpatient     I Expect 2 midnight stay secondary to severity of patient's current illness need for inpatient interventions justified by the following:     Severe lab/radiological/exam abnormalities including:    Hyperkalemia    Acute on chronic KI (acute kidney injury)     and extensive comorbidities including:   CKD   malignancy,    That are currently affecting medical management.   I expect  patient to be hospitalized for 2 midnights requiring inpatient medical care.  Patient is at high risk for adverse outcome (such as loss of life or disability) if not treated.  Indication for inpatient stay as follows:    Need for operative/procedural  intervention Need for frequent lab work  Need for  IV fluids,     Level of care         progressive       tele indefinitely please discontinue once patient no longer qualifies COVID-19 Labs    Berniece Abid 11/19/2023, 8:56 PM    [facility]     after 2 AM please page floor coverage   If 7AM-7PM, please contact the day team taking care of the patient using [email or web address]

## 2023-11-19 NOTE — Assessment & Plan Note (Signed)
 Recurrent patient will likely need to be discharged with Lokelma  prescription Treated aggressively in the emergency department was given calcium  gluconate, insulin , D50, Lokelma , Lasix 80 mg , 1 L NS, and 50 mEq of bicarb , albuterol  nebulizer ordered EKG showing changes worrisome for hyperkalemia Nephrology is aware at this point hopes that hyperkalemia can be improved with medical interventions Recommend the patient will be transferred to [facility] Which patient at this point adamantly refuses we will continue to discuss with her alternatively may need to be admitted to [facility] stepdown.  Monitor on telemetry Bmet every 4 hours

## 2023-11-19 NOTE — Assessment & Plan Note (Signed)
Ob tain anemia panel 

## 2023-11-19 NOTE — Assessment & Plan Note (Signed)
 Imaging suggestive of COPD.  Patient denies ever smoking.  She has been having more exertional shortness of breath. Albuterol  as needed Once stable may need to get further evaluation with PFTs as an outpatient

## 2023-11-19 NOTE — Assessment & Plan Note (Addendum)
 Patient was given D50 and 3 units of insulin  on repeat labs her blood sugar noted to be at 247.  Patient has no history of diabetes that she is aware of.  Check hemoglobin A1c For tonight order sliding scale and monitor blood sugars carefully May have underlying undiagnosed diabetes

## 2023-11-19 NOTE — Assessment & Plan Note (Signed)
Soft blood pressures allow permissive hypertension for now

## 2023-11-19 NOTE — ED Notes (Signed)
 Pt is refusing to go to [facility], despite being advised to go. Pt stated she would either like to stay here at [facility] or leave AMA. MD aware.

## 2023-11-19 NOTE — Assessment & Plan Note (Signed)
S/p resection 

## 2023-11-19 NOTE — Telephone Encounter (Signed)
 Copied from CRM [id number]. Topic: General - Other >> Nov 19, 2023 10:46 AM Howard Macho wrote: Reason for CRM: patient would like to be called in regards to having an operation done CB [phone]

## 2023-11-19 NOTE — Telephone Encounter (Signed)
 Pt in ED.

## 2023-11-19 NOTE — ED Triage Notes (Signed)
 Patient presents because she feels her potassium levels may not be in normal limits. She reports feeling tired and out of breath when moving from room to room at work.

## 2023-11-19 NOTE — ED Provider Notes (Signed)
 [place] EMERGENCY DEPARTMENT AT [facility] Provider Note   CSN: [id number] Arrival date & time: 11/19/23  1540     History  Chief Complaint  Patient presents with   Shortness of Breath    Colleen Potter is a 58 y.o. female history of hypertension, CKD, hyperkalemia here presenting with shortness of breath and weakness.  Patient states that for the last week or so, she just feels very tired and out of breath when she has minimal exertion.  Patient denies any leg swelling.  Patient had previous history of hyperkalemia but she states that she did not have symptoms at that time.  Patient is no longer on meds for hyperkalemia.  Patient states that she is urinating normally and denies any leg swelling.  Denies any history of heart failure.  The history is provided by the patient.       Home Medications Prior to Admission medications   Medication Sig Start Date End Date Taking? Authorizing Provider  NIFEdipine  (PROCARDIA  XL/NIFEDICAL-XL) 90 MG 24 hr tablet Take 1 tablet (90 mg total) by mouth daily. 08/20/23   Adra Alanis, FNP      Allergies    Patient has no known allergies.    Review of Systems   Review of Systems  Respiratory:  Positive for shortness of breath.   All other systems reviewed and are negative.   Physical Exam Updated Vital Signs BP 124/84   Pulse 85   Temp 98.7 F (37.1 C) (Oral)   Resp 18   LMP  (LMP Unknown)   SpO2 100%  Physical Exam Vitals and nursing note reviewed.  Constitutional:      Appearance: She is well-developed.  HENT:     Head: Normocephalic.     Mouth/Throat:     Mouth: Mucous membranes are moist.  Eyes:     Extraocular Movements: Extraocular movements intact.     Pupils: Pupils are equal, round, and reactive to light.  Cardiovascular:     Rate and Rhythm: Normal rate and regular rhythm.  Pulmonary:     Comments: Diminished bilaterally but no obvious crackles or wheezing Abdominal:     General: Bowel sounds  are normal.     Palpations: Abdomen is soft.  Musculoskeletal:        General: Normal range of motion.     Cervical back: Normal range of motion and neck supple.     Comments: No pedal edema  Skin:    General: Skin is warm.     Capillary Refill: Capillary refill takes less than 2 seconds.  Neurological:     General: No focal deficit present.     Mental Status: She is alert and oriented to person, place, and time.  Psychiatric:        Behavior: Behavior normal.     ED Results / Procedures / Treatments   Labs (all labs ordered are listed, but only abnormal results are displayed) Labs Reviewed  CBC WITH DIFFERENTIAL/PLATELET - Abnormal; Notable for the following components:      Result Value   RBC 3.85 (*)    Hemoglobin 11.3 (*)    All other components within normal limits  I-STAT CHEM 8, ED - Abnormal; Notable for the following components:   Sodium 134 (*)    Potassium 7.9 (*)    Chloride 118 (*)    BUN 69 (*)    Creatinine, Ser 4.90 (*)    TCO2 14 (*)    All other components  within normal limits  TSH  BRAIN NATRIURETIC PEPTIDE  COMPREHENSIVE METABOLIC PANEL WITH GFR  MAGNESIUM  COMPREHENSIVE METABOLIC PANEL WITH GFR  TROPONIN I (HIGH SENSITIVITY)  TROPONIN I (HIGH SENSITIVITY)    EKG EKG Interpretation Date/Time:  Wednesday Nov 19 2023 16:13:26 EDT Ventricular Rate:  68 PR Interval:  168 QRS Duration:  81 QT Interval:  331 QTC Calculation: 352 R Axis:   29  Text Interpretation: Sinus rhythm Minimal ST elevation, inferior leads No significant change since last tracing Confirmed by Florette Hurry [phone]) on 11/19/2023 4:28:54 PM  Radiology DG Chest 2 View Result Date: 11/19/2023 CLINICAL DATA:  Shortness of breath EXAM: CHEST - 2 VIEW COMPARISON:  None Available. FINDINGS: The heart size and mediastinal contours are within normal limits. Both lungs are clear. The visualized skeletal structures are unremarkable. IMPRESSION: No active cardiopulmonary disease. Mild COPD  changes with hyperinflation both lung bases and flattening of the diaphragms Electronically Signed   By: Fredrich Jefferson M.D.   On: 11/19/2023 17:09    Procedures Procedures    CRITICAL CARE Performed by: Florette Hurry   Total critical care time: 45 minutes  Critical care time was exclusive of separately billable procedures and treating other patients.  Critical care was necessary to treat or prevent imminent or life-threatening deterioration.  Critical care was time spent personally by me on the following activities: development of treatment plan with patient and/or surrogate as well as nursing, discussions with consultants, evaluation of patient's response to treatment, examination of patient, obtaining history from patient or surrogate, ordering and performing treatments and interventions, ordering and review of laboratory studies, ordering and review of radiographic studies, pulse oximetry and re-evaluation of patient's condition.   Medications Ordered in ED Medications  calcium  gluconate 1 g/ 50 mL sodium chloride  IVPB (has no administration in time range)  insulin  aspart (novoLOG ) injection 2 Units (has no administration in time range)  dextrose  50 % solution 50 mL (has no administration in time range)  sodium zirconium cyclosilicate  (LOKELMA ) packet 10 g (has no administration in time range)  furosemide (LASIX) injection 80 mg (has no administration in time range)  sodium chloride  0.9 % bolus 1,000 mL (1,000 mLs Intravenous New Bag/Given 11/19/23 1841)  sodium bicarbonate  injection 50 mEq (50 mEq Intravenous Given 11/19/23 1842)    ED Course/ Medical Decision Making/ A&P                                 Medical Decision Making Colleen Potter is a 58 y.o. female who presented with shortness of breath on exertion.  Consider ACS versus heart failure versus renal failure versus hyperkalemia versus hypothyroidism versus other electrolyte abnormalities.  Plan to get CBC and CMP and BNP and  troponin and magnesium level and troponin and chest x-ray.  6:57 PM I reviewed patient's labs and potassium is 7.9.  Patient has a bicarb of 14.  Patient also has peaked T waves on EKG.  Discussed with Dr. Higinio Love from nephrology.  She recommend IV fluids and Lasix and bicarb and Lokelma  and insulin  and glucose.  She recommend hospitalist admission and transfer to [facility].  She states that nephrology will see patient in the morning.  Problems Addressed: AKI (acute kidney injury) (HCC): acute illness or injury Hyperkalemia: acute illness or injury  Amount and/or Complexity of Data Reviewed Labs: ordered. Decision-making details documented in ED Course. Radiology: ordered and independent interpretation performed. Decision-making  details documented in ED Course.  Risk Prescription drug management. Decision regarding hospitalization.    Final Clinical Impression(s) / ED Diagnoses Final diagnoses:  Hyperkalemia  AKI (acute kidney injury) [facility])    Rx / DC Orders ED Discharge Orders     None         Dalene Duck, MD 11/19/23 1859

## 2023-11-20 DIAGNOSIS — J449 Chronic obstructive pulmonary disease, unspecified: Secondary | ICD-10-CM | POA: Diagnosis not present

## 2023-11-20 DIAGNOSIS — N184 Chronic kidney disease, stage 4 (severe): Secondary | ICD-10-CM | POA: Diagnosis not present

## 2023-11-20 DIAGNOSIS — E875 Hyperkalemia: Secondary | ICD-10-CM | POA: Diagnosis not present

## 2023-11-20 DIAGNOSIS — N179 Acute kidney failure, unspecified: Secondary | ICD-10-CM | POA: Diagnosis not present

## 2023-11-20 LAB — COMPREHENSIVE METABOLIC PANEL WITH GFR
ALT: 8 U/L (ref 0–44)
ALT: 9 U/L (ref 0–44)
AST: 11 U/L — ABNORMAL LOW (ref 15–41)
AST: 9 U/L — ABNORMAL LOW (ref 15–41)
Albumin: 4 g/dL (ref 3.5–5.0)
Albumin: 3.6 g/dL (ref 3.5–5.0)
Alkaline Phosphatase: 42 U/L (ref 38–126)
Alkaline Phosphatase: 40 U/L (ref 38–126)
Anion gap: 12 (ref 5–15)
Anion gap: 12 (ref 5–15)
BUN: 62 mg/dL — ABNORMAL HIGH (ref 6–20)
BUN: 59 mg/dL — ABNORMAL HIGH (ref 6–20)
CO2: 12 mmol/L — ABNORMAL LOW (ref 22–32)
CO2: 15 mmol/L — ABNORMAL LOW (ref 22–32)
Calcium: 9.3 mg/dL (ref 8.9–10.3)
Calcium: 9 mg/dL (ref 8.9–10.3)
Chloride: 109 mmol/L (ref 98–111)
Chloride: 108 mmol/L (ref 98–111)
Creatinine, Ser: 4.24 mg/dL — ABNORMAL HIGH (ref 0.44–1.00)
Creatinine, Ser: 4.28 mg/dL — ABNORMAL HIGH (ref 0.44–1.00)
GFR, Estimated: 12 mL/min — ABNORMAL LOW (ref >60)
GFR, Estimated: 11 mL/min — ABNORMAL LOW (ref >60)
Glucose, Bld: 80 mg/dL (ref 70–99)
Glucose, Bld: 59 mg/dL — ABNORMAL LOW (ref 70–99)
Potassium: 6.2 mmol/L — ABNORMAL HIGH (ref 3.5–5.1)
Potassium: 5.1 mmol/L (ref 3.5–5.1)
Sodium: 133 mmol/L — ABNORMAL LOW (ref 135–145)
Sodium: 135 mmol/L (ref 135–145)
Total Bilirubin: 0.4 mg/dL (ref 0.0–1.2)
Total Bilirubin: 0.5 mg/dL (ref 0.0–1.2)
Total Protein: 8 g/dL (ref 6.5–8.1)
Total Protein: 7.5 g/dL (ref 6.5–8.1)

## 2023-11-20 LAB — RENAL FUNCTION PANEL
Albumin: 3.6 g/dL (ref 3.5–5.0)
Albumin: 3.4 g/dL — ABNORMAL LOW (ref 3.5–5.0)
Anion gap: 11 (ref 5–15)
Anion gap: 7 (ref 5–15)
BUN: 62 mg/dL — ABNORMAL HIGH (ref 6–20)
BUN: 65 mg/dL — ABNORMAL HIGH (ref 6–20)
CO2: 15 mmol/L — ABNORMAL LOW (ref 22–32)
CO2: 18 mmol/L — ABNORMAL LOW (ref 22–32)
Calcium: 9 mg/dL (ref 8.9–10.3)
Calcium: 8.8 mg/dL — ABNORMAL LOW (ref 8.9–10.3)
Chloride: 110 mmol/L (ref 98–111)
Chloride: 109 mmol/L (ref 98–111)
Creatinine, Ser: 4.39 mg/dL — ABNORMAL HIGH (ref 0.44–1.00)
Creatinine, Ser: 4.31 mg/dL — ABNORMAL HIGH (ref 0.44–1.00)
GFR, Estimated: 11 mL/min — ABNORMAL LOW (ref >60)
GFR, Estimated: 11 mL/min — ABNORMAL LOW (ref >60)
Glucose, Bld: 57 mg/dL — ABNORMAL LOW (ref 70–99)
Glucose, Bld: 100 mg/dL — ABNORMAL HIGH (ref 70–99)
Phosphorus: 5 mg/dL — ABNORMAL HIGH (ref 2.5–4.6)
Phosphorus: 4.9 mg/dL — ABNORMAL HIGH (ref 2.5–4.6)
Potassium: 5.2 mmol/L — ABNORMAL HIGH (ref 3.5–5.1)
Potassium: 6 mmol/L — ABNORMAL HIGH (ref 3.5–5.1)
Sodium: 136 mmol/L (ref 135–145)
Sodium: 134 mmol/L — ABNORMAL LOW (ref 135–145)

## 2023-11-20 LAB — MAGNESIUM: Magnesium: 2.1 mg/dL (ref 1.7–2.4)

## 2023-11-20 LAB — RETICULOCYTES
Immature Retic Fract: 3.3 % (ref 2.3–15.9)
RBC.: 3.59 MIL/uL — ABNORMAL LOW (ref 3.87–5.11)
Retic Count, Absolute: 48.5 10*3/uL (ref 19.0–186.0)
Retic Ct Pct: 1.4 % (ref 0.4–3.1)

## 2023-11-20 LAB — GLUCOSE, CAPILLARY
Glucose-Capillary: 110 mg/dL — ABNORMAL HIGH (ref 70–99)
Glucose-Capillary: 134 mg/dL — ABNORMAL HIGH (ref 70–99)
Glucose-Capillary: 190 mg/dL — ABNORMAL HIGH (ref 70–99)
Glucose-Capillary: 68 mg/dL — ABNORMAL LOW (ref 70–99)
Glucose-Capillary: 80 mg/dL (ref 70–99)
Glucose-Capillary: 94 mg/dL (ref 70–99)
Glucose-Capillary: 94 mg/dL (ref 70–99)

## 2023-11-20 LAB — BASIC METABOLIC PANEL WITH GFR
Anion gap: 7 (ref 5–15)
BUN: 64 mg/dL — ABNORMAL HIGH (ref 6–20)
CO2: 19 mmol/L — ABNORMAL LOW (ref 22–32)
Calcium: 8.8 mg/dL — ABNORMAL LOW (ref 8.9–10.3)
Chloride: 109 mmol/L (ref 98–111)
Creatinine, Ser: 4.17 mg/dL — ABNORMAL HIGH (ref 0.44–1.00)
GFR, Estimated: 12 mL/min — ABNORMAL LOW (ref >60)
Glucose, Bld: 88 mg/dL (ref 70–99)
Potassium: 5.8 mmol/L — ABNORMAL HIGH (ref 3.5–5.1)
Sodium: 135 mmol/L (ref 135–145)

## 2023-11-20 LAB — VITAMIN B12: Vitamin B-12: 340 pg/mL (ref 180–914)

## 2023-11-20 LAB — HEMOGLOBIN A1C
Hgb A1c MFr Bld: 5.3 % (ref 4.8–5.6)
Mean Plasma Glucose: 105.41 mg/dL

## 2023-11-20 LAB — IRON AND TIBC
Iron: 65 ug/dL (ref 28–170)
Saturation Ratios: 20 % (ref 10.4–31.8)
TIBC: 333 ug/dL (ref 250–450)
UIBC: 268 ug/dL

## 2023-11-20 LAB — CBC
HCT: 31.2 % — ABNORMAL LOW (ref 36.0–46.0)
Hemoglobin: 9.8 g/dL — ABNORMAL LOW (ref 12.0–15.0)
MCH: 29.3 pg (ref 26.0–34.0)
MCHC: 31.4 g/dL (ref 30.0–36.0)
MCV: 93.1 fL (ref 80.0–100.0)
Platelets: 317 10*3/uL (ref 150–400)
RBC: 3.35 MIL/uL — ABNORMAL LOW (ref 3.87–5.11)
RDW: 13.6 % (ref 11.5–15.5)
WBC: 6 10*3/uL (ref 4.0–10.5)
nRBC: 0 % (ref 0.0–0.2)

## 2023-11-20 LAB — HIV ANTIBODY (ROUTINE TESTING W REFLEX): HIV Screen 4th Generation wRfx: NONREACTIVE

## 2023-11-20 LAB — FERRITIN: Ferritin: 44 ng/mL (ref 11–307)

## 2023-11-20 LAB — FOLATE: Folate: 8.7 ng/mL (ref >5.9)

## 2023-11-20 LAB — MRSA NEXT GEN BY PCR, NASAL: MRSA by PCR Next Gen: DETECTED — AB

## 2023-11-20 LAB — PHOSPHORUS: Phosphorus: 5.3 mg/dL — ABNORMAL HIGH (ref 2.5–4.6)

## 2023-11-20 MED ORDER — INSULIN ASPART 100 UNIT/ML IV SOLN
5.0000 [IU] | Freq: Once | INTRAVENOUS | Status: AC
  Administered 2023-11-20: 5 [IU] via INTRAVENOUS

## 2023-11-20 MED ORDER — ALBUTEROL SULFATE (2.5 MG/3ML) 0.083% IN NEBU: 2.5000 mg | INHALATION_SOLUTION | RESPIRATORY_TRACT | Status: DC | PRN

## 2023-11-20 MED ORDER — SODIUM ZIRCONIUM CYCLOSILICATE 10 G PO PACK
10.0000 g | PACK | Freq: Every day | ORAL | Status: DC
  Filled 2023-11-20: qty 1

## 2023-11-20 MED ORDER — HYDROCODONE-ACETAMINOPHEN 5-325 MG PO TABS: 1.0000 | ORAL_TABLET | ORAL | Status: DC | PRN

## 2023-11-20 MED ORDER — DEXTROSE 50 % IV SOLN
12.5000 g | INTRAVENOUS | Status: AC
Start: 2023-11-20 — End: 2023-11-20
  Administered 2023-11-20: 12.5 g via INTRAVENOUS
  Filled 2023-11-20: qty 50

## 2023-11-20 MED ORDER — MUPIROCIN 2 % EX OINT
1.0000 | TOPICAL_OINTMENT | Freq: Two times a day (BID) | CUTANEOUS | Status: DC
  Administered 2023-11-20 – 2023-11-21 (×3): 1 via NASAL
  Filled 2023-11-20: qty 22

## 2023-11-20 MED ORDER — SODIUM BICARBONATE 8.4 % IV SOLN
Freq: Once | INTRAVENOUS | Status: AC
  Filled 2023-11-20: qty 150

## 2023-11-20 MED ORDER — ACETAMINOPHEN 650 MG RE SUPP: 650.0000 mg | Freq: Four times a day (QID) | RECTAL | Status: DC | PRN

## 2023-11-20 MED ORDER — ACETAMINOPHEN 325 MG PO TABS: 650.0000 mg | ORAL_TABLET | Freq: Four times a day (QID) | ORAL | Status: DC | PRN

## 2023-11-20 MED ORDER — ONDANSETRON HCL 4 MG PO TABS: 4.0000 mg | ORAL_TABLET | Freq: Four times a day (QID) | ORAL | Status: DC | PRN

## 2023-11-20 MED ORDER — SODIUM CHLORIDE 0.9 % IV SOLN: INTRAVENOUS | Status: DC

## 2023-11-20 MED ORDER — SODIUM ZIRCONIUM CYCLOSILICATE 10 G PO PACK
10.0000 g | PACK | Freq: Three times a day (TID) | ORAL | Status: DC
Start: 2023-11-20 — End: 2023-11-21
  Administered 2023-11-20 (×2): 10 g via ORAL
  Filled 2023-11-20 (×2): qty 1

## 2023-11-20 MED ORDER — ONDANSETRON HCL 4 MG/2ML IJ SOLN: 4.0000 mg | Freq: Four times a day (QID) | INTRAMUSCULAR | Status: DC | PRN

## 2023-11-20 MED ORDER — DEXTROSE 50 % IV SOLN
1.0000 | Freq: Once | INTRAVENOUS | Status: AC
  Administered 2023-11-20: 50 mL via INTRAVENOUS
  Filled 2023-11-20: qty 50

## 2023-11-20 NOTE — Consult Note (Signed)
 [name]  INPATIENT CONSULTATION  Reason for Consultation: hyperkalemia Requesting Provider: Dr. Thelma Fire  HPI: Colleen Potter is an 58 y.o. female with HTN, s/p R nephrectomy 07/2023 (at [facility], leiomyosarcoma), CKD 4 f/b Dr. Haze List  Presented to [facility] ED yesterday with fatigue and dyspnea.  Hadn't recently been taking meds and in context of h/o hyperkalemia was worried about the same.  Labs showed Cr 4.9 (was last mid 3s) and K 7.9.  She received shifting agents and lokelma , fluids and IV lasix.  As of this AM improved to 5.1 but did rebound to 5.8 at 9:45am.  Lokelma  redosed.    She's afebrile, BP 110-120s, 100% on RA. CXR clear but COPD changes.  Her weakness has improved.  She was NPO and was just now allowed to order a tray but she feels hungry and has been tolerating po intake.  She had no LUTs, normal urine volume.  Says bladder scan was fine.  No edema or uremic symptoms.   She had 09/2023 appt with Dr. Zelda Hickman with 1 mo f/u planned - she arrived late for the appt and it was cancelled but not rescheduled - she says she called to warn the office she didn't get out of work until just prior to the appt and was told it was fine then turned away. She was out of meds and didn't call for refills after that happened.  She's been on low K diet.    PMH: Past Medical History:  Diagnosis Date   Hypertension    states under control with med., has been on med. x 2 yr.   Trimalleolar fracture of ankle, closed 08/03/2016   right   PSH: Past Surgical History:  Procedure Laterality Date   NO PAST SURGERIES     ORIF ANKLE FRACTURE Right 08/07/2016   Procedure: OPEN REDUCTION INTERNAL FIXATION (ORIF) RIGHT ANKLE FRACTURE;  Surgeon: Wes Hamman, MD;  Location: [facility];  Service: Orthopedics;  Laterality: Right;   RADIOLOGY WITH ANESTHESIA N/A 03/20/2023   Procedure: MRI OF ABDOMEN WITH AND WITHOUT CONTRAST WITH ANESTHESIA;  Surgeon: Radiologist, Medication, MD;  Location: [facility] OR;   Service: Radiology;  Laterality: N/A;    Past Medical History:  Diagnosis Date   Hypertension    states under control with med., has been on med. x 2 yr.   Trimalleolar fracture of ankle, closed 08/03/2016   right    Medications:  I have reviewed the patient's current medications.  Medications Prior to Admission  Medication Sig Dispense Refill   NIFEdipine  (PROCARDIA  XL/NIFEDICAL-XL) 90 MG 24 hr tablet Take 1 tablet (90 mg total) by mouth daily. 90 tablet 3    ALLERGIES:  No Known Allergies  FAM HX: History reviewed. No pertinent family history.  Social History:   reports that she has never smoked. She has never used smokeless tobacco. She reports that she does not drink alcohol and does not use drugs.  ROS: 12 system ROS neg except per HPI  Blood pressure 121/81, pulse 71, temperature 98.1 F (36.7 C), temperature source Oral, resp. rate 18, height 5\' 6"  (1.676 m), weight 80.7 kg, SpO2 98%. PHYSICAL EXAM: Gen: well appearing, comfortable  Eyes: EOMI ENT: poor dentition Neck: supple, no JVD CV:  no rub Abd:  soft, RUQ large healed scar s/p nephrectomy Back: clear lungs GU: no foley Extr:  no edema Neuro: nonfocal   Results for orders placed or performed during the hospital encounter of 11/19/23 (from the past 48  hours)  CBC with Differential     Status: Abnormal   Collection Time: 11/19/23  4:10 PM  Result Value Ref Range   WBC 6.1 4.0 - 10.5 K/uL   RBC 3.85 (L) 3.87 - 5.11 MIL/uL   Hemoglobin 11.3 (L) 12.0 - 15.0 g/dL   HCT 40.9 81.1 - 91.4 %   MCV 95.6 80.0 - 100.0 fL   MCH 29.4 26.0 - 34.0 pg   MCHC 30.7 30.0 - 36.0 g/dL   RDW 78.2 95.6 - 21.3 %   Platelets 383 150 - 400 K/uL   nRBC 0.0 0.0 - 0.2 %   Neutrophils Relative % 64 %   Neutro Abs 3.8 1.7 - 7.7 K/uL   Lymphocytes Relative 30 %   Lymphs Abs 1.8 0.7 - 4.0 K/uL   Monocytes Relative 5 %   Monocytes Absolute 0.3 0.1 - 1.0 K/uL   Eosinophils Relative 0 %   Eosinophils Absolute 0.0 0.0 - 0.5 K/uL    Basophils Relative 1 %   Basophils Absolute 0.1 0.0 - 0.1 K/uL   Immature Granulocytes 0 %   Abs Immature Granulocytes 0.02 0.00 - 0.07 K/uL    Comment: Performed at [facility], [address], [place], [place] [address]  TSH     Status: None   Collection Time: 11/19/23  4:10 PM  Result Value Ref Range   TSH 0.531 0.350 - 4.500 uIU/mL    Comment: Performed by a 3rd Generation assay with a functional sensitivity of <=0.01 uIU/mL. Performed at [facility], [address] [place], [place] [address]   Brain natriuretic peptide     Status: None   Collection Time: 11/19/23  4:10 PM  Result Value Ref Range   B Natriuretic Peptide 18.3 0.0 - 100.0 pg/mL    Comment: Performed at [facility], [address], [place], [place] [address]  Troponin I (High Sensitivity)     Status: None   Collection Time: 11/19/23  4:10 PM  Result Value Ref Range   Troponin I (High Sensitivity) 3 <18 ng/L    Comment: (NOTE) Elevated high sensitivity troponin I (hsTnI) values and significant  changes across serial measurements may suggest ACS but many other  chronic and acute conditions are known to elevate hsTnI results.  Refer to the "Links" section for chest pain algorithms and additional  guidance. Performed at [facility], [address] [place], [place] [address]   I-stat chem 8, ED (not at [facility], [facility] or [facility])     Status: Abnormal   Collection Time: 11/19/23  6:52 PM  Result Value Ref Range   Sodium 134 (L) 135 - 145 mmol/L   Potassium 7.9 (HH) 3.5 - 5.1 mmol/L   Chloride 118 (H) 98 - 111 mmol/L   BUN 69 (H) 6 - 20 mg/dL   Creatinine, Ser 4.01 (H) 0.44 - 1.00 mg/dL   Glucose, Bld 90 70 - 99 mg/dL    Comment: Glucose reference range applies only to samples taken after fasting for at least 8 hours.   Calcium , Ion 1.30 1.15 - 1.40 mmol/L   TCO2 14 (L) 22 - 32 mmol/L   Hemoglobin 12.6 12.0 - 15.0 g/dL   HCT 02.7 25.3 -  66.4 %   Comment NOTIFIED PHYSICIAN   Troponin I (High Sensitivity)     Status: None   Collection Time: 11/19/23  7:46 PM  Result Value Ref Range   Troponin I (High Sensitivity) 3 <18  ng/L    Comment: (NOTE) Elevated high sensitivity troponin I (hsTnI) values and significant  changes across serial measurements may suggest ACS but many other  chronic and acute conditions are known to elevate hsTnI results.  Refer to the "Links" section for chest pain algorithms and additional  guidance. Performed at [facility], [address] [place], [place] [address]   Comprehensive metabolic panel with GFR     Status: Abnormal   Collection Time: 11/19/23  7:46 PM  Result Value Ref Range   Sodium 131 (L) 135 - 145 mmol/L   Potassium >7.5 (HH) 3.5 - 5.1 mmol/L    Comment: CRITICAL RESULT CALLED TO, READ BACK BY AND VERIFIED WITH S. KANT, RN 11/19/23 2057 BY K. DAVIS   Chloride 111 98 - 111 mmol/L   CO2 14 (L) 22 - 32 mmol/L   Glucose, Bld 247 (H) 70 - 99 mg/dL    Comment: Glucose reference range applies only to samples taken after fasting for at least 8 hours.   BUN 63 (H) 6 - 20 mg/dL   Creatinine, Ser 6.04 (H) 0.44 - 1.00 mg/dL   Calcium  8.4 (L) 8.9 - 10.3 mg/dL   Total Protein 7.1 6.5 - 8.1 g/dL   Albumin 3.6 3.5 - 5.0 g/dL   AST 11 (L) 15 - 41 U/L   ALT 8 0 - 44 U/L   Alkaline Phosphatase 38 38 - 126 U/L   Total Bilirubin 0.6 0.0 - 1.2 mg/dL   GFR, Estimated 11 (L) >60 mL/min    Comment: (NOTE) Calculated using the CKD-EPI Creatinine Equation (2021)    Anion gap 6 5 - 15    Comment: Performed at [facility], [address] [place], [place] [address]  Magnesium     Status: None   Collection Time: 11/19/23  7:46 PM  Result Value Ref Range   Magnesium 2.0 1.7 - 2.4 mg/dL    Comment: Performed at [facility], [address] [place], [place] [address]  CK     Status: None   Collection Time: 11/19/23  7:46 PM  Result Value  Ref Range   Total CK 55 38 - 234 U/L    Comment: Performed at [facility], [address] [place], [place] [address]  Phosphorus     Status: None   Collection Time: 11/19/23  7:46 PM  Result Value Ref Range   Phosphorus 4.5 2.5 - 4.6 mg/dL    Comment: Performed at [facility], [address] [place], [place] [address]  Osmolality     Status: Abnormal   Collection Time: 11/19/23  7:46 PM  Result Value Ref Range   Osmolality 309 (H) 275 - 295 mOsm/kg    Comment: Performed at [facility], [address], [place], [place] [address]  Urinalysis, Complete w Microscopic -Urine, Clean Catch     Status: Abnormal   Collection Time: 11/19/23  8:56 PM  Result Value Ref Range   Color, Urine STRAW (A) YELLOW   APPearance CLEAR CLEAR   Specific Gravity, Urine 1.006 1.005 - 1.030   pH 5.0 5.0 - 8.0   Glucose, UA 50 (A) NEGATIVE mg/dL   Hgb urine dipstick NEGATIVE NEGATIVE   Bilirubin Urine NEGATIVE NEGATIVE   Ketones, ur NEGATIVE NEGATIVE mg/dL   Protein, ur NEGATIVE NEGATIVE mg/dL   Nitrite NEGATIVE NEGATIVE   Leukocytes,Ua TRACE (A) NEGATIVE   RBC / HPF 0-5 0 - 5 RBC/hpf  WBC, UA 0-5 0 - 5 WBC/hpf   Bacteria, UA RARE (A) NONE SEEN   Squamous Epithelial / HPF 0-5 0 - 5 /HPF    Comment: Performed at [facility], [address], [place], [place] [address]  Creatinine, urine, random     Status: None   Collection Time: 11/19/23  8:56 PM  Result Value Ref Range   Creatinine, Urine 31 mg/dL    Comment: Performed at [facility], [address], [place], [place] [address]  Osmolality, urine     Status: None   Collection Time: 11/19/23  8:56 PM  Result Value Ref Range   Osmolality, Ur 328 300 - 900 mOsm/kg    Comment: Performed at [facility], [address], [place], [place] [address]  Sodium, urine, random     Status: None   Collection Time: 11/19/23  8:56 PM  Result Value Ref Range   Sodium, Ur  120 mmol/L    Comment: Performed at [facility], [address] [place], [place] [address]  CBG monitoring, ED     Status: None   Collection Time: 11/19/23  9:43 PM  Result Value Ref Range   Glucose-Capillary 81 70 - 99 mg/dL    Comment: Glucose reference range applies only to samples taken after fasting for at least 8 hours.  Basic metabolic panel with GFR     Status: Abnormal   Collection Time: 11/19/23  9:47 PM  Result Value Ref Range   Sodium 134 (L) 135 - 145 mmol/L   Potassium 5.2 (H) 3.5 - 5.1 mmol/L   Chloride 110 98 - 111 mmol/L   CO2 16 (L) 22 - 32 mmol/L   Glucose, Bld 84 70 - 99 mg/dL    Comment: Glucose reference range applies only to samples taken after fasting for at least 8 hours.   BUN 62 (H) 6 - 20 mg/dL   Creatinine, Ser 2.72 (H) 0.44 - 1.00 mg/dL   Calcium  9.3 8.9 - 10.3 mg/dL   GFR, Estimated 12 (L) >60 mL/min    Comment: (NOTE) Calculated using the CKD-EPI Creatinine Equation (2021)    Anion gap 8 5 - 15    Comment: Performed at [facility], [address], [place], [place] [address]  MRSA Next Gen by PCR, Nasal     Status: Abnormal   Collection Time: 11/19/23 11:13 PM   Specimen: Nasal Mucosa; Nasal Swab  Result Value Ref Range   MRSA by PCR Next Gen DETECTED (A) NOT DETECTED    Comment: (NOTE) The GeneXpert MRSA Assay (FDA approved for NASAL specimens only), is one component of a comprehensive MRSA colonization surveillance program. It is not intended to diagnose MRSA infection nor to guide or monitor treatment for MRSA infections. Test performance is not FDA approved in patients less than 50 years old. Performed at [facility], [address] [place], [place] [address]   Iron and TIBC     Status: None   Collection Time: 11/20/23 12:34 AM  Result Value Ref Range   Iron 65 28 - 170 ug/dL   TIBC 425 956 - 387 ug/dL   Saturation Ratios 20 10.4 - 31.8 %   UIBC 268 ug/dL    Comment:  Performed at [facility], [address], [place], [place] [address]  Ferritin     Status: None   Collection Time: 11/20/23 12:34 AM  Result Value Ref Range   Ferritin  44 11 - 307 ng/mL    Comment: Performed at [facility], [address] [place], [place] [address]  Reticulocytes     Status: Abnormal   Collection Time: 11/20/23 12:34 AM  Result Value Ref Range   Retic Ct Pct 1.4 0.4 - 3.1 %   RBC. 3.59 (L) 3.87 - 5.11 MIL/uL   Retic Count, Absolute 48.5 19.0 - 186.0 K/uL   Immature Retic Fract 3.3 2.3 - 15.9 %    Comment: Performed at [facility], [address] [place], [place] [address]  Hemoglobin A1c     Status: None   Collection Time: 11/20/23 12:34 AM  Result Value Ref Range   Hgb A1c MFr Bld 5.3 4.8 - 5.6 %    Comment: (NOTE) Pre diabetes:          5.7%-6.4%  Diabetes:              >6.4%  Glycemic control for   <7.0% adults with diabetes    Mean Plasma Glucose 105.41 mg/dL    Comment: Performed at [facility], [address] [place], [place] [address]  Comprehensive metabolic panel     Status: Abnormal   Collection Time: 11/20/23 12:41 AM  Result Value Ref Range   Sodium 133 (L) 135 - 145 mmol/L   Potassium 6.2 (H) 3.5 - 5.1 mmol/L   Chloride 109 98 - 111 mmol/L   CO2 12 (L) 22 - 32 mmol/L   Glucose, Bld 80 70 - 99 mg/dL    Comment: Glucose reference range applies only to samples taken after fasting for at least 8 hours.   BUN 62 (H) 6 - 20 mg/dL   Creatinine, Ser 9.14 (H) 0.44 - 1.00 mg/dL   Calcium  9.3 8.9 - 10.3 mg/dL   Total Protein 8.0 6.5 - 8.1 g/dL   Albumin 4.0 3.5 - 5.0 g/dL   AST 11 (L) 15 - 41 U/L   ALT 8 0 - 44 U/L   Alkaline Phosphatase 42 38 - 126 U/L   Total Bilirubin 0.4 0.0 - 1.2 mg/dL   GFR, Estimated 12 (L) >60 mL/min    Comment: (NOTE) Calculated using the CKD-EPI Creatinine Equation (2021)    Anion gap 12 5 - 15    Comment: Performed at [facility],  [address], [place], [place] [address]  Vitamin B12     Status: None   Collection Time: 11/20/23 12:41 AM  Result Value Ref Range   Vitamin B-12 340 180 - 914 pg/mL    Comment: (NOTE) This assay is not validated for testing neonatal or myeloproliferative syndrome specimens for Vitamin B12 levels. Performed at [facility], [address] [place], [place] [address]   Folate     Status: None   Collection Time: 11/20/23 12:41 AM  Result Value Ref Range   Folate 8.7 >5.9 ng/mL    Comment: Performed at [facility], [address] [place], [place] [address]  Glucose, capillary     Status: Abnormal   Collection Time: 11/20/23  4:10 AM  Result Value Ref Range   Glucose-Capillary 190 (H) 70 - 99 mg/dL    Comment: Glucose reference range applies only to samples taken after fasting for at least 8 hours.   Comment 1 Notify RN    Comment 2 Document in Chart   Magnesium     Status: None   Collection Time: 11/20/23  6:50 AM  Result  Value Ref Range   Magnesium 2.1 1.7 - 2.4 mg/dL    Comment: Performed at [facility], [address] [place], [place] [address]  Phosphorus     Status: Abnormal   Collection Time: 11/20/23  6:50 AM  Result Value Ref Range   Phosphorus 5.3 (H) 2.5 - 4.6 mg/dL    Comment: Performed at [facility], [address], [place], [place] [address]  Comprehensive metabolic panel     Status: Abnormal   Collection Time: 11/20/23  6:50 AM  Result Value Ref Range   Sodium 135 135 - 145 mmol/L   Potassium 5.1 3.5 - 5.1 mmol/L   Chloride 108 98 - 111 mmol/L   CO2 15 (L) 22 - 32 mmol/L   Glucose, Bld 59 (L) 70 - 99 mg/dL    Comment: Glucose reference range applies only to samples taken after fasting for at least 8 hours.   BUN 59 (H) 6 - 20 mg/dL   Creatinine, Ser 0.98 (H) 0.44 - 1.00 mg/dL   Calcium  9.0 8.9 - 10.3 mg/dL   Total Protein 7.5 6.5 - 8.1 g/dL   Albumin 3.6 3.5 - 5.0 g/dL   AST  9 (L) 15 - 41 U/L   ALT 9 0 - 44 U/L   Alkaline Phosphatase 40 38 - 126 U/L   Total Bilirubin 0.5 0.0 - 1.2 mg/dL   GFR, Estimated 11 (L) >60 mL/min    Comment: (NOTE) Calculated using the CKD-EPI Creatinine Equation (2021)    Anion gap 12 5 - 15    Comment: Performed at [facility], [address] [place], [place] [address]  CBC     Status: Abnormal   Collection Time: 11/20/23  6:50 AM  Result Value Ref Range   WBC 6.0 4.0 - 10.5 K/uL   RBC 3.35 (L) 3.87 - 5.11 MIL/uL   Hemoglobin 9.8 (L) 12.0 - 15.0 g/dL   HCT 78.2 (L) 95.6 - 21.3 %   MCV 93.1 80.0 - 100.0 fL   MCH 29.3 26.0 - 34.0 pg   MCHC 31.4 30.0 - 36.0 g/dL   RDW 08.6 57.8 - 46.9 %   Platelets 317 150 - 400 K/uL   nRBC 0.0 0.0 - 0.2 %    Comment: Performed at [facility], [address] [place], [place] [address]  Renal function panel     Status: Abnormal   Collection Time: 11/20/23  7:02 AM  Result Value Ref Range   Sodium 136 135 - 145 mmol/L   Potassium 5.2 (H) 3.5 - 5.1 mmol/L   Chloride 110 98 - 111 mmol/L   CO2 15 (L) 22 - 32 mmol/L   Glucose, Bld 57 (L) 70 - 99 mg/dL    Comment: Glucose reference range applies only to samples taken after fasting for at least 8 hours.   BUN 62 (H) 6 - 20 mg/dL   Creatinine, Ser 8.41 (H) 0.44 - 1.00 mg/dL   Calcium  9.0 8.9 - 10.3 mg/dL   Phosphorus 5.0 (H) 2.5 - 4.6 mg/dL   Albumin 3.6 3.5 - 5.0 g/dL   GFR, Estimated 11 (L) >60 mL/min    Comment: (NOTE) Calculated using the CKD-EPI Creatinine Equation (2021)    Anion gap 11 5 - 15    Comment: Performed at [facility], [address], [place], [place] [address]  Glucose, capillary     Status: Abnormal   Collection Time: 11/20/23  7:36 AM  Result Value Ref Range   Glucose-Capillary 68 (L) 70 - 99 mg/dL    Comment: Glucose reference range applies only to samples taken after fasting for at least 8 hours.  Glucose, capillary     Status: Abnormal   Collection Time:  11/20/23  8:36 AM  Result Value Ref Range   Glucose-Capillary 110 (H) 70 - 99 mg/dL    Comment: Glucose reference range applies only to samples taken after fasting for at least 8 hours.  Basic metabolic panel with GFR     Status: Abnormal   Collection Time: 11/20/23  9:42 AM  Result Value Ref Range   Sodium 135 135 - 145 mmol/L   Potassium 5.8 (H) 3.5 - 5.1 mmol/L   Chloride 109 98 - 111 mmol/L   CO2 19 (L) 22 - 32 mmol/L   Glucose, Bld 88 70 - 99 mg/dL    Comment: Glucose reference range applies only to samples taken after fasting for at least 8 hours.   BUN 64 (H) 6 - 20 mg/dL   Creatinine, Ser 1.61 (H) 0.44 - 1.00 mg/dL   Calcium  8.8 (L) 8.9 - 10.3 mg/dL   GFR, Estimated 12 (L) >60 mL/min    Comment: (NOTE) Calculated using the CKD-EPI Creatinine Equation (2021)    Anion gap 7 5 - 15    Comment: Performed at [facility], [address], [place], [place] [address]    DG Chest 2 View Result Date: 11/19/2023 CLINICAL DATA:  Shortness of breath EXAM: CHEST - 2 VIEW COMPARISON:  None Available. FINDINGS: The heart size and mediastinal contours are within normal limits. Both lungs are clear. The visualized skeletal structures are unremarkable. IMPRESSION: No active cardiopulmonary disease. Mild COPD changes with hyperinflation both lung bases and flattening of the diaphragms Electronically Signed   By: Fredrich Jefferson M.D.   On: 11/19/2023 17:09    Assessment/Plan **hyperkalemia:  in setting of advanced CKD/reduced nephron mass and no lokelma  x 1 month.  Was on low K diet.  Has improved with aggressive management.  Needs to stay inpatient for now for serial labs to ensure staying down - had received shifting agents and having some rebound.  Taking po intake d/c MIVF for now.  Says was able to obtain lokelma  outpt - will need to ensure has in hand on d/c.    **CKD 4:  f/b Dr. Zelda Hickman.   Appears to be having progression, likely from hyperfiltration related to reduced nephron  mass and preexisting CKD VS an acute component with lowish BPs.  Cont max supportive care - maintain euvolemia, avoid nephrotoxins, maintain normotension. RRT discussion has been initiated by Dr. Zelda Hickman, will need further conversations and close f/u. Will arrange close f/u in clinic prior to discharge.  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    **HTN: h/o such listed, BP on lower side, no need for meds right now.  Was on nifedipine  90 alone outpt per chart.   **Metabolic acidosis: In setting of CKD. Start po bicarb.   **Anemia:  Hb 9s - ESA use has been deferred to outpt oncology in setting of recent leiomyosarcoma.   **Mebabolic bone dz:  Corr ca ok, Phos 5.  Renal diet.  Check PTH.   Will follow.   Baron Border 11/20/2023, 11:19 AM

## 2023-11-20 NOTE — Progress Notes (Signed)
   11/20/23 1605  TOC Brief Assessment  Insurance and Status Reviewed  Patient has primary care physician Yes  Home environment has been reviewed home with family  Prior level of function: independent  Prior/Current Home Services No current home services  Social Drivers of Health Review SDOH reviewed no interventions necessary  Readmission risk has been reviewed Yes  Transition of care needs no transition of care needs at this time

## 2023-11-20 NOTE — Progress Notes (Signed)
 [facility]                                                                              Colleen Potter, is a 58 y.o. female, DOB - Jul 17, 1965, WUJ:[id number] Admit date - 11/19/2023    Outpatient Primary MD for the patient is Ike Malady, Honora Lutes, FNP  LOS - 1  days  Chief Complaint  Patient presents with   Shortness of Breath       Brief summary   Patient is a 58 y.o. female with CKD stage 4, HTN, hyperkalemia, s/p right nephrectomy due to renal mass consistent with leiomyosarcoma presented with weakness, fatigue and shortness of breath with exertion, worried her potassium is abnormal.  Hx of Hyperkalemia in the past, supposed to be on lokelma  and ran out. Found to have K of 7.9, Cr 4.9 and bicarb of 14 Nephrology Dr. Higinio Love consulted, recommended admission to [facility].  In ER was given calcium  gluconate, insulin , D50, Lokelma , Lasix 80 mg , 1 L NS, and 50 mEq of bicarb.   Assessment & Plan    Principal Problem:   Hyperkalemia - K 7.9 on presentation. In ER was given calcium  gluconate, insulin , D50, Lokelma , Lasix 80 mg, 1 L NS, and 50 mEq of bicarb.  - K this morning 5.2, continue lokelma .  - discussed with Dr Higinio Love, will see her   Active Problems: AKI superimposed on CKD (chronic kidney disease), stage IV (HCC), NAG metabolic acidosis - baseline Cr 3.1-3.3 - Presented with Cr 4.9, hyperk, bicarb 14 - overnight received calcium  gluconate, insulin , D50, Lokelma , Lasix 80 mg , 1 L NS, and 50 mEq of bicarb in ED. Nephrology consulted.   - Cr improving 4.23 - Renal ultrasound on 09/18/2023 had shown normal left kidney, s/p right nephrectomy,     Essential hypertension - BP stable    Hx renal mass s/p nephrectomy, Leiomyosarcoma of retroperitoneum (HCC) - follows [facility] for care, last follow up in 08/2023     Anemia in chronic renal disease - H/H stable     COPD  (HCC) - Stable, no wheezing   Hypoglycemia - Resolved   Estimated body mass index  is 28.72 kg/m as calculated from the following:   Height as of this encounter: 5\' 6"  (1.676 m).   Weight as of this encounter: 80.7 kg.  Code Status: full code  DVT Prophylaxis:  SCDs Start: 11/20/23 0200   Level of Care: Level of care: Stepdown Family Communication: Updated patient Disposition Plan:      Remains inpatient appropriate: Awaiting nephrology evaluation.  Discussed with the patient, she states that if recommended by nephrology, she is okay to go to [facility].   Procedures:    Consultants:   Nephrology   Antimicrobials:   Anti-infectives (From admission, onward)    None          Medications  Chlorhexidine  Gluconate Cloth  6 each Topical Daily   insulin  aspart  0-6 Units Subcutaneous Q4H   mupirocin ointment  1 Application Nasal BID   sodium bicarbonate  150 mEq in dextrose  5 % 1,150 mL infusion   Intravenous Once  sodium zirconium cyclosilicate   5 g Oral Daily      Subjective:   Colleen Potter was seen and examined today.  No ongoing nausea vomiting, chest pain or shortness of breath.  Overall feels that she feels better today.  No fevers, no abdominal pain or diarrhea. Objective:   Vitals:   11/20/23 0200 11/20/23 0205 11/20/23 0300 11/20/23 0400  BP:  138/84 121/81 114/63  Pulse: 62 63 73 65  Resp: 14 15 12 15   Temp:    (!) 97.5 F (36.4 C)  TempSrc:    Oral  SpO2: 100% 99% 97% 97%  Weight:      Height:        Intake/Output Summary (Last 24 hours) at 11/20/2023 0649 Last data filed at 11/20/2023 0435 Gross per 24 hour  Intake 737.63 ml  Output 425 ml  Net 312.63 ml     Wt Readings from Last 3 Encounters:  11/20/23 80.7 kg  09/19/23 85.7 kg  03/20/23 83.9 kg     Exam General: Alert and oriented x 3, NAD Cardiovascular: S1 S2 auscultated,  RRR Respiratory: Diminished at the bases otherwise no wheezing. Gastrointestinal: Soft, nontender, nondistended, + bowel sounds Ext: no pedal edema bilaterally Neuro: no new deficits Psych:  Normal affect     Data Reviewed:  I have personally reviewed following labs    CBC Lab Results  Component Value Date   WBC 6.1 11/19/2023   RBC 3.59 (L) 11/20/2023   HGB 12.6 11/19/2023   HCT 37.0 11/19/2023   MCV 95.6 11/19/2023   MCH 29.4 11/19/2023   PLT 383 11/19/2023   MCHC 30.7 11/19/2023   RDW 13.7 11/19/2023   LYMPHSABS 1.8 11/19/2023   MONOABS 0.3 11/19/2023   EOSABS 0.0 11/19/2023   BASOSABS 0.1 11/19/2023     Last metabolic panel Lab Results  Component Value Date   NA 133 (L) 11/20/2023   K 6.2 (H) 11/20/2023   CL 109 11/20/2023   CO2 12 (L) 11/20/2023   BUN 62 (H) 11/20/2023   CREATININE 4.24 (H) 11/20/2023   GLUCOSE 80 11/20/2023   GFRNONAA 12 (L) 11/20/2023   CALCIUM  9.3 11/20/2023   PHOS 4.5 11/19/2023   PROT 8.0 11/20/2023   ALBUMIN 4.0 11/20/2023   BILITOT 0.4 11/20/2023   ALKPHOS 42 11/20/2023   AST 11 (L) 11/20/2023   ALT 8 11/20/2023   ANIONGAP 12 11/20/2023    CBG (last 3)  Recent Labs    11/19/23 2143 11/20/23 0410  GLUCAP 81 190*      Coagulation Profile: No results for input(s): "INR", "PROTIME" in the last 168 hours.   Radiology Studies: I have personally reviewed the imaging studies  DG Chest 2 View Result Date: 11/19/2023 CLINICAL DATA:  Shortness of breath EXAM: CHEST - 2 VIEW COMPARISON:  None Available. FINDINGS: The heart size and mediastinal contours are within normal limits. Both lungs are clear. The visualized skeletal structures are unremarkable. IMPRESSION: No active cardiopulmonary disease. Mild COPD changes with hyperinflation both lung bases and flattening of the diaphragms Electronically Signed   By: Fredrich Jefferson M.D.   On: 11/19/2023 17:09       Sharday Michl M.D. [facility] 11/20/2023, 6:49 AM  Available via Epic secure chat 7am-7pm After 7 pm, please refer to night coverage provider listed on amion.

## 2023-11-21 ENCOUNTER — Other Ambulatory Visit (HOSPITAL_COMMUNITY): Payer: Self-pay

## 2023-11-21 ENCOUNTER — Other Ambulatory Visit: Payer: Self-pay

## 2023-11-21 DIAGNOSIS — I10 Essential (primary) hypertension: Secondary | ICD-10-CM | POA: Diagnosis not present

## 2023-11-21 DIAGNOSIS — N179 Acute kidney failure, unspecified: Secondary | ICD-10-CM | POA: Diagnosis not present

## 2023-11-21 DIAGNOSIS — N184 Chronic kidney disease, stage 4 (severe): Secondary | ICD-10-CM | POA: Diagnosis not present

## 2023-11-21 DIAGNOSIS — E875 Hyperkalemia: Secondary | ICD-10-CM | POA: Diagnosis not present

## 2023-11-21 LAB — RENAL FUNCTION PANEL
Albumin: 3.3 g/dL — ABNORMAL LOW (ref 3.5–5.0)
Anion gap: 9 (ref 5–15)
BUN: 63 mg/dL — ABNORMAL HIGH (ref 6–20)
CO2: 15 mmol/L — ABNORMAL LOW (ref 22–32)
Calcium: 8.8 mg/dL — ABNORMAL LOW (ref 8.9–10.3)
Chloride: 110 mmol/L (ref 98–111)
Creatinine, Ser: 4.23 mg/dL — ABNORMAL HIGH (ref 0.44–1.00)
GFR, Estimated: 12 mL/min — ABNORMAL LOW (ref >60)
Glucose, Bld: 126 mg/dL — ABNORMAL HIGH (ref 70–99)
Phosphorus: 5.2 mg/dL — ABNORMAL HIGH (ref 2.5–4.6)
Potassium: 4.7 mmol/L (ref 3.5–5.1)
Sodium: 134 mmol/L — ABNORMAL LOW (ref 135–145)

## 2023-11-21 LAB — CBC
HCT: 27.9 % — ABNORMAL LOW (ref 36.0–46.0)
Hemoglobin: 8.9 g/dL — ABNORMAL LOW (ref 12.0–15.0)
MCH: 29.9 pg (ref 26.0–34.0)
MCHC: 31.9 g/dL (ref 30.0–36.0)
MCV: 93.6 fL (ref 80.0–100.0)
Platelets: 249 10*3/uL (ref 150–400)
RBC: 2.98 MIL/uL — ABNORMAL LOW (ref 3.87–5.11)
RDW: 13.5 % (ref 11.5–15.5)
WBC: 4.9 10*3/uL (ref 4.0–10.5)
nRBC: 0 % (ref 0.0–0.2)

## 2023-11-21 LAB — GLUCOSE, CAPILLARY
Glucose-Capillary: 121 mg/dL — ABNORMAL HIGH (ref 70–99)
Glucose-Capillary: 85 mg/dL (ref 70–99)

## 2023-11-21 MED ORDER — SODIUM BICARBONATE 650 MG PO TABS
1300.0000 mg | ORAL_TABLET | Freq: Three times a day (TID) | ORAL | 3 refills | Status: DC
  Filled 2023-11-21: qty 180, 30d supply, fill #0
  Filled 2023-12-15: qty 180, 30d supply, fill #1
  Filled 2024-01-20: qty 180, 30d supply, fill #2
  Filled 2024-02-17: qty 180, 30d supply, fill #3

## 2023-11-21 MED ORDER — ONDANSETRON HCL 4 MG PO TABS
4.0000 mg | ORAL_TABLET | Freq: Three times a day (TID) | ORAL | 0 refills | Status: DC | PRN
  Filled 2023-11-21: qty 20, 7d supply, fill #0

## 2023-11-21 MED ORDER — SODIUM BICARBONATE 650 MG PO TABS
1300.0000 mg | ORAL_TABLET | Freq: Three times a day (TID) | ORAL | Status: DC
  Administered 2023-11-21: 1300 mg via ORAL
  Filled 2023-11-21: qty 2

## 2023-11-21 MED ORDER — SODIUM ZIRCONIUM CYCLOSILICATE 10 G PO PACK
10.0000 g | PACK | Freq: Two times a day (BID) | ORAL | Status: DC
  Administered 2023-11-21: 10 g via ORAL
  Filled 2023-11-21: qty 1

## 2023-11-21 MED ORDER — SODIUM ZIRCONIUM CYCLOSILICATE 5 G PO PACK
10.0000 g | PACK | Freq: Two times a day (BID) | ORAL | 3 refills | Status: DC
  Filled 2023-11-21: qty 102, 25d supply, fill #0
  Filled 2023-11-21: qty 120, 30d supply, fill #0
  Filled 2023-11-21: qty 18, 5d supply, fill #0

## 2023-11-21 NOTE — Progress Notes (Addendum)
 [facility] Progress Note   Subjective:   feels well.  K now normal.  Rec'd 3 doses lokelma  yesterday. No UOP documented but she says its normal.  She requests d/c today  Objective Vitals:   11/21/23 0600 11/21/23 0700 11/21/23 0800 11/21/23 0828  BP: 128/80 (!) 126/91 (!) 140/91   Pulse: 71 72 (!) 59   Resp: 16 17 15    Temp:    97.7 F (36.5 C)  TempSrc:    Oral  SpO2: 97% 97% 98%   Weight:      Height:       Physical Exam General: well appearing Heart:RRR Lungs:clear Abdomen:soft Extremities:no edema Neuro: nonfocal  Additional Objective Labs: Basic Metabolic Panel: Recent Labs  Lab 11/20/23 0702 11/20/23 0942 11/20/23 1532 11/21/23 0336  NA 136 135 134* 134*  K 5.2* 5.8* 6.0* 4.7  CL 110 109 109 110  CO2 15* 19* 18* 15*  GLUCOSE 57* 88 100* 126*  BUN 62* 64* 65* 63*  CREATININE 4.39* 4.17* 4.31* 4.23*  CALCIUM  9.0 8.8* 8.8* 8.8*  PHOS 5.0*  --  4.9* 5.2*   Liver Function Tests: Recent Labs  Lab 11/19/23 1946 11/20/23 0041 11/20/23 0650 11/20/23 0702 11/20/23 1532 11/21/23 0336  AST 11* 11* 9*  --   --   --   ALT 8 8 9   --   --   --   ALKPHOS 38 42 40  --   --   --   BILITOT 0.6 0.4 0.5  --   --   --   PROT 7.1 8.0 7.5  --   --   --   ALBUMIN 3.6 4.0 3.6 3.6 3.4* 3.3*   No results for input(s): "LIPASE", "AMYLASE" in the last 168 hours. CBC: Recent Labs  Lab 11/19/23 1610 11/19/23 1852 11/20/23 0650 11/21/23 0336  WBC 6.1  --  6.0 4.9  NEUTROABS 3.8  --   --   --   HGB 11.3* 12.6 9.8* 8.9*  HCT 36.8 37.0 31.2* 27.9*  MCV 95.6  --  93.1 93.6  PLT 383  --  317 249   Blood Culture No results found for: "SDES", "SPECREQUEST", "CULT", "REPTSTATUS"  Cardiac Enzymes: Recent Labs  Lab 11/19/23 1946  CKTOTAL 55   CBG: Recent Labs  Lab 11/20/23 1543 11/20/23 1948 11/20/23 2335 11/21/23 0441 11/21/23 0803  GLUCAP 94 134* 94 121* 85   Iron Studies:  Recent Labs    11/20/23 0034  IRON 65  TIBC 333  FERRITIN 44    @[email or web address] @ Studies/Results: DG Chest 2 View Result Date: 11/19/2023 CLINICAL DATA:  Shortness of breath EXAM: CHEST - 2 VIEW COMPARISON:  None Available. FINDINGS: The heart size and mediastinal contours are within normal limits. Both lungs are clear. The visualized skeletal structures are unremarkable. IMPRESSION: No active cardiopulmonary disease. Mild COPD changes with hyperinflation both lung bases and flattening of the diaphragms Electronically Signed   By: Fredrich Jefferson M.D.   On: 11/19/2023 17:09   Medications:   Chlorhexidine  Gluconate Cloth  6 each Topical Daily   insulin  aspart  0-6 Units Subcutaneous Q4H   mupirocin  ointment  1 Application Nasal BID   sodium bicarbonate   1,300 mg Oral TID   sodium zirconium cyclosilicate   10 g Oral BID    Assessment/Plan **hyperkalemia:  in setting of advanced CKD/reduced nephron mass and no lokelma  x 1 month.  Was on low K diet.  Has improved with aggressive management.  Ok to d/c today -  please send out on lokelma  BID over weekend and will arrange for her to have labs done Tues next week at [facility] - messaged office to order.   Hospitalist aware pt needs med in hand today.    **CKD 4:  f/b Dr. Zelda Hickman.   Appears to be having progression, likely from hyperfiltration related to reduced nephron mass and preexisting CKD VS an acute component with lowish BPs.  Cont max supportive care - maintain euvolemia, avoid nephrotoxins, maintain normotension. RRT discussion has been initiated by Dr. Zelda Hickman, will need further conversations and close f/u. F/u Dr. Zelda Hickman 12/11/23 3:40pm - pt aware.    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     **HTN: resume nifedipine  at d/c    **Metabolic acidosis: In setting of CKD. D/c on po bicarb 1300 TID.    **Anemia:  Hb 9s - ESA use has been deferred to outpt oncology in  setting of recent leiomyosarcoma.    **Mebabolic bone dz:  Corr ca ok, Phos 5.  Renal diet.  Ordering PTH to be checked outpt next week.     Ok for d/c today as above.  D/w primary.   Adrian Alba MD 11/21/2023, 10:39 AM  [facility] Pager: [phone]

## 2023-11-21 NOTE — Discharge Summary (Signed)
 Physician Discharge Summary   Patient: Colleen Potter MRN: [id number] DOB: 12/21/1965  Admit date:     11/19/2023  Discharge date: 11/21/23  Discharge Physician: Bertram Brocks, MD   PCP: Adra Alanis, FNP   Recommendations at discharge:   Continue Lokelma  10 mg twice daily, prescriptions sent to [facility] pharmacy, verified that Lokelma  is available although in limited quantity today and they will order it to be picked up on Monday. Continue sodium bicarb tabs 1300 mg 3 times daily  Outpatient follow-up with nephrology.  Discharge Diagnoses:    Hyperkalemia Acute kidney injury superimposed on CKD stage IV   Essential hypertension   Leiomyosarcoma of retroperitoneum (HCC)   CKD (chronic kidney disease), stage IV (HCC)   Anemia in chronic renal disease   COPD suggested by initial evaluation [facility])    Hyperglycemia    Hospital Course:  Patient is a 58 y.o. female with CKD stage 4, HTN, hyperkalemia, s/p right nephrectomy due to renal mass consistent with leiomyosarcoma presented with weakness, fatigue and shortness of breath with exertion, worried her potassium is abnormal.  Hx of Hyperkalemia in the past, supposed to be on lokelma  and ran out. Found to have K of 7.9, Cr 4.9 and bicarb of 14 Nephrology Dr. Higinio Love consulted, recommended admission to [facility].  In ER was given calcium  gluconate, insulin , D50, Lokelma , Lasix  80 mg , 1 L NS, and 50 mEq of bicarb.    Assessment and Plan:   Hyperkalemia - K 7.9 on presentation. In ER was given calcium  gluconate, insulin , D50, Lokelma , Lasix  80 mg, 1 L NS, and 50 mEq of bicarb.  - Started on Lokelma  10 g 3 times daily, received 3 doses of Lokelma  yesterday, potassium 4.7 today -Per nephrology, patient can discharge home today with Lokelma  10 g twice daily.  Prescription was sent to [facility] pharmacy and verified that it is available although a limited quantity and patient will pick up the rest of the prescription on Monday.    AKI  superimposed on CKD (chronic kidney disease), stage IV (HCC), NAG metabolic acidosis - baseline Cr 3.1-3.3 - Presented with Cr 4.9, hyperk, bicarb 14 - received calcium  gluconate, insulin , D50, Lokelma , Lasix  80 mg , 1 L NS, and 50 mEq of bicarb in ED, then placed on bicarb drip. Nephrology was consulted.  - Renal ultrasound on 09/18/2023 had shown normal left kidney, s/p right nephrectomy,  -Creatinine 4.2 at discharge. -Per nephrology, if patient needs dialysis in future it will be through [facility].  Patient was reluctant to be admitted there. -Cleared by nephrology to discharge home     Essential hypertension - BP stable     Hx renal mass s/p nephrectomy, Leiomyosarcoma of retroperitoneum (HCC) - follows [facility] for care, last follow up in 08/2023       Anemia in chronic renal disease - H/H stable      COPD  (HCC) - Stable, no wheezing     Hypoglycemia - Resolved     Estimated body mass index is 28.72 kg/m as calculated from the following:   Height as of this encounter: 5\' 6"  (1.676 m).   Weight as of this encounter: 80.7 kg.       Pain control - [place]  Controlled Substance Reporting System database was reviewed. and patient was instructed, not to drive, operate heavy machinery, perform activities at heights, swimming or participation in water  activities or provide baby-sitting services while on Pain, Sleep and Anxiety Medications; until  their outpatient Physician has advised to do so again. Also recommended to not to take more than prescribed Pain, Sleep and Anxiety Medications.  Consultants: Nephrology Procedures performed: None Disposition: Home Diet recommendation:  Discharge Diet Orders (From admission, onward)     Start     Ordered   11/21/23 0000  Diet - low sodium heart healthy        11/21/23 1102            DISCHARGE MEDICATION: Allergies as of 11/21/2023   No Known Allergies      Medication List     TAKE these  medications    NIFEdipine  90 MG 24 hr tablet Commonly known as: PROCARDIA  XL/NIFEDICAL-XL Take 1 tablet (90 mg total) by mouth daily.   ondansetron  4 MG tablet Commonly known as: ZOFRAN  Take 1 tablet (4 mg total) by mouth every 8 (eight) hours as needed for nausea.   sodium bicarbonate  650 MG tablet Take 2 tablets (1,300 mg total) by mouth 3 (three) times daily.   sodium zirconium cyclosilicate  10 g Pack packet Commonly known as: LOKELMA  Take 10 g by mouth 2 (two) times daily.        Follow-up Information     Adra Alanis, FNP. Schedule an appointment as soon as possible for a visit in 2 week(s).   Specialty: Internal Medicine Why: for hospital follow-up Contact information: [address] [place] [address] [phone]         Cristi Donalds, MD Follow up on 12/11/2023.   Specialty: Nephrology Why: 3:40PM, for hospital follow-up Contact information: [address] [place] [address] [phone]                Discharge Exam: Cleavon Curls Weights   11/20/23 0000  Weight: 80.7 kg   S: No acute complaints, hoping to go home today.  No nausea vomiting, chest pain, abdominal pain.  BP (!) 140/91 (BP Location: Right Arm)   Pulse (!) 59   Temp 97.7 F (36.5 C) (Oral)   Resp 15   Ht 5\' 6"  (1.676 m)   Wt 80.7 kg   SpO2 98%   BMI 28.72 kg/m   Physical Exam General: Alert and oriented x 3, NAD Cardiovascular: S1 S2 clear, RRR.  Respiratory: CTAB, no wheezing, rales or rhonchi Gastrointestinal: Soft, nontender, nondistended, NBS Ext: no pedal edema bilaterally Neuro: no new deficits Psych: Normal affect    Condition at discharge: fair  The results of significant diagnostics from this hospitalization (including imaging, microbiology, ancillary and laboratory) are listed below for reference.   Imaging Studies: DG Chest 2 View Result Date: 11/19/2023 CLINICAL DATA:  Shortness of breath EXAM: CHEST - 2 VIEW COMPARISON:  None Available.  FINDINGS: The heart size and mediastinal contours are within normal limits. Both lungs are clear. The visualized skeletal structures are unremarkable. IMPRESSION: No active cardiopulmonary disease. Mild COPD changes with hyperinflation both lung bases and flattening of the diaphragms Electronically Signed   By: Fredrich Jefferson M.D.   On: 11/19/2023 17:09    Microbiology: Results for orders placed or performed during the hospital encounter of 11/19/23  MRSA Next Gen by PCR, Nasal     Status: Abnormal   Collection Time: 11/19/23 11:13 PM   Specimen: Nasal Mucosa; Nasal Swab  Result Value Ref Range Status   MRSA by PCR Next Gen DETECTED (A) NOT DETECTED Final    Comment: (NOTE) The GeneXpert MRSA Assay (FDA approved for NASAL specimens only), is one component of a  comprehensive MRSA colonization surveillance program. It is not intended to diagnose MRSA infection nor to guide or monitor treatment for MRSA infections. Test performance is not FDA approved in patients less than 5 years old. Performed at [facility], [address], [place], [place] [address]     Labs: CBC: Recent Labs  Lab 11/19/23 1610 11/19/23 1852 11/20/23 0650 11/21/23 0336  WBC 6.1  --  6.0 4.9  NEUTROABS 3.8  --   --   --   HGB 11.3* 12.6 9.8* 8.9*  HCT 36.8 37.0 31.2* 27.9*  MCV 95.6  --  93.1 93.6  PLT 383  --  317 249   Basic Metabolic Panel: Recent Labs  Lab 11/19/23 1946 11/19/23 2147 11/20/23 0650 11/20/23 0702 11/20/23 0942 11/20/23 1532 11/21/23 0336  NA 131*   < > 135 136 135 134* 134*  K >7.5*   < > 5.1 5.2* 5.8* 6.0* 4.7  CL 111   < > 108 110 109 109 110  CO2 14*   < > 15* 15* 19* 18* 15*  GLUCOSE 247*   < > 59* 57* 88 100* 126*  BUN 63*   < > 59* 62* 64* 65* 63*  CREATININE 4.37*   < > 4.28* 4.39* 4.17* 4.31* 4.23*  CALCIUM  8.4*   < > 9.0 9.0 8.8* 8.8* 8.8*  MG 2.0  --  2.1  --   --   --   --   PHOS 4.5  --  5.3* 5.0*  --  4.9* 5.2*   < > = values in this interval  not displayed.   Liver Function Tests: Recent Labs  Lab 11/19/23 1946 11/20/23 0041 11/20/23 0650 11/20/23 0702 11/20/23 1532 11/21/23 0336  AST 11* 11* 9*  --   --   --   ALT 8 8 9   --   --   --   ALKPHOS 38 42 40  --   --   --   BILITOT 0.6 0.4 0.5  --   --   --   PROT 7.1 8.0 7.5  --   --   --   ALBUMIN 3.6 4.0 3.6 3.6 3.4* 3.3*   CBG: Recent Labs  Lab 11/20/23 1543 11/20/23 1948 11/20/23 2335 11/21/23 0441 11/21/23 0803  GLUCAP 94 134* 94 121* 85    Discharge time spent: greater than 30 minutes.  Signed: Bertram Brocks, MD [facility] 11/21/2023

## 2023-11-24 ENCOUNTER — Telehealth: Payer: Self-pay

## 2023-11-24 ENCOUNTER — Other Ambulatory Visit: Payer: Self-pay

## 2023-11-24 NOTE — Transitions of Care (Post Inpatient/ED Visit) (Signed)
   11/24/2023  Name: Colleen Potter MRN: [id number] DOB: [birth year]  Today's TOC FU Call Status: Today's TOC FU Call Status:: Successful TOC FU Call Completed TOC FU Call Complete Date: 11/24/23 Patient's Name and Date of Birth confirmed.  Transition Care Management Follow-up Telephone Call Date of Discharge: 11/21/23 Discharge Facility: [facility] Sm[facility]) Type of Discharge: Inpatient Admission Primary Inpatient Discharge Diagnosis:: Hyperkalemia How have you been since you were released from the hospital?: Better Any questions or concerns?: No  Items Reviewed: Did you receive and understand the discharge instructions provided?: Yes Medications obtained,verified, and reconciled?: Yes (Medications Reviewed) Any new allergies since your discharge?: No Dietary orders reviewed?: NA Do you have support at home?: No  Medications Reviewed Today: Medications Reviewed Today     Reviewed by Cathye Coca, LPN (Licensed Practical Nurse) on 11/24/23 at 1017  Med List Status: <None>   Medication Order Taking? Sig Documenting Provider Last Dose Status Informant  NIFEdipine  (PROCARDIA  XL/NIFEDICAL-XL) 90 MG 24 hr tablet [id number] Yes Take 1 tablet (90 mg total) by mouth daily. Adra Alanis, FNP Taking Active Self  ondansetron  (ZOFRAN ) 4 MG tablet [id number] Yes Take 1 tablet (4 mg total) by mouth every 8 (eight) hours as needed for nausea. Loma Rising, MD Taking Active   sodium bicarbonate  650 MG tablet [id number] Yes Take 2 tablets (1,300 mg total) by mouth 3 (three) times daily. Rai, Hurman Maiden, MD Taking Active   sodium zirconium cyclosilicate  (LOKELMA ) 5 g packet [id number] Yes mix and take 2 packets by mouth 2 (two) times daily. Loma Rising, MD Taking Active             Home Care and Equipment/Supplies: Were Home Health Services Ordered?: NA Any new equipment or medical supplies ordered?: NA  Functional Questionnaire: Do you need assistance with bathing/showering or  dressing?: No Do you need assistance with meal preparation?: No Do you need assistance with eating?: No Do you have difficulty maintaining continence: No Do you need assistance with getting out of bed/getting out of a chair/moving?: No Do you have difficulty managing or taking your medications?: No  Follow up appointments reviewed: PCP Follow-up appointment confirmed?: Yes Date of PCP follow-up appointment?: 12/02/23 Follow-up Provider: Glade Lambert FNP Specialist Hospital Follow-up appointment confirmed?: Yes Date of Specialist follow-up appointment?: 12/11/23 Follow-Up Specialty Provider:: Nephrology Do you need transportation to your follow-up appointment?: No Do you understand care options if your condition(s) worsen?: Yes-patient verbalized understanding    SIGNATURE Se[place]ron [place], LPN [facility] Health Advisor [place] [facility] You Are. We Are. One [facility] Direct Dial [phone]

## 2023-11-25 DIAGNOSIS — N184 Chronic kidney disease, stage 4 (severe): Secondary | ICD-10-CM | POA: Diagnosis not present

## 2023-11-26 DIAGNOSIS — N1832 Chronic kidney disease, stage 3b: Secondary | ICD-10-CM | POA: Diagnosis not present

## 2023-11-26 DIAGNOSIS — I10 Essential (primary) hypertension: Secondary | ICD-10-CM | POA: Diagnosis not present

## 2023-11-26 DIAGNOSIS — C480 Malignant neoplasm of retroperitoneum: Secondary | ICD-10-CM | POA: Diagnosis not present

## 2023-11-26 DIAGNOSIS — Z01818 Encounter for other preprocedural examination: Secondary | ICD-10-CM | POA: Diagnosis not present

## 2023-11-26 DIAGNOSIS — R001 Bradycardia, unspecified: Secondary | ICD-10-CM | POA: Diagnosis not present

## 2023-11-28 DIAGNOSIS — C480 Malignant neoplasm of retroperitoneum: Secondary | ICD-10-CM | POA: Diagnosis not present

## 2023-11-28 DIAGNOSIS — I129 Hypertensive chronic kidney disease with stage 1 through stage 4 chronic kidney disease, or unspecified chronic kidney disease: Secondary | ICD-10-CM | POA: Diagnosis not present

## 2023-11-28 DIAGNOSIS — J9811 Atelectasis: Secondary | ICD-10-CM | POA: Diagnosis not present

## 2023-11-28 DIAGNOSIS — R918 Other nonspecific abnormal finding of lung field: Secondary | ICD-10-CM | POA: Diagnosis not present

## 2023-11-28 DIAGNOSIS — N1832 Chronic kidney disease, stage 3b: Secondary | ICD-10-CM | POA: Diagnosis not present

## 2023-11-28 DIAGNOSIS — R001 Bradycardia, unspecified: Secondary | ICD-10-CM | POA: Diagnosis not present

## 2023-12-01 ENCOUNTER — Other Ambulatory Visit (HOSPITAL_COMMUNITY): Payer: Self-pay

## 2023-12-02 ENCOUNTER — Encounter: Payer: Self-pay | Admitting: Family

## 2023-12-02 ENCOUNTER — Ambulatory Visit: Admitting: Family

## 2023-12-02 ENCOUNTER — Other Ambulatory Visit: Payer: Self-pay

## 2023-12-02 ENCOUNTER — Other Ambulatory Visit (HOSPITAL_COMMUNITY): Payer: Self-pay

## 2023-12-02 VITALS — BP 132/74 | HR 51 | Ht 66.0 in | Wt 174.8 lb

## 2023-12-02 DIAGNOSIS — E875 Hyperkalemia: Secondary | ICD-10-CM

## 2023-12-02 DIAGNOSIS — C480 Malignant neoplasm of retroperitoneum: Secondary | ICD-10-CM

## 2023-12-02 DIAGNOSIS — I10 Essential (primary) hypertension: Secondary | ICD-10-CM | POA: Diagnosis not present

## 2023-12-02 MED ORDER — NIFEDIPINE ER OSMOTIC RELEASE 90 MG PO TB24
90.0000 mg | ORAL_TABLET | Freq: Every day | ORAL | 3 refills | Status: DC
  Filled 2023-12-02: qty 90, 90d supply, fill #0
  Filled 2023-12-16 – 2024-02-15 (×2): qty 90, 90d supply, fill #1
  Filled 2024-05-20: qty 90, 90d supply, fill #2
  Filled 2024-06-18 – 2024-07-25 (×3): qty 90, 90d supply, fill #3

## 2023-12-02 MED ORDER — POTASSIUM CHLORIDE CRYS ER 20 MEQ PO TBCR
20.0000 meq | EXTENDED_RELEASE_TABLET | Freq: Every day | ORAL | 0 refills | Status: DC
  Filled 2023-12-02: qty 3, 3d supply, fill #0

## 2023-12-02 NOTE — Progress Notes (Signed)
 Colleen Potter is a 58 y.o. female with the following history as recorded in EpicCare:  Patient Active Problem List   Diagnosis Date Noted   COPD suggested by initial evaluation (HCC) 11/19/2023   AKI (acute kidney injury) (HCC) 11/19/2023   Hyperglycemia 11/19/2023   Hyperkalemia 09/18/2023   CKD (chronic kidney disease), stage IV (HCC) 09/18/2023   Thrombocytosis 09/18/2023   Anemia in chronic renal disease 09/18/2023   Compensated metabolic acidosis 09/18/2023   Leiomyosarcoma of retroperitoneum (HCC) 09/03/2023   Bradycardia 09/03/2023   Acute medial meniscus tear of left knee 12/04/2021   Chronic venous insufficiency 05/12/2018   Displaced trimalleolar fracture of right lower leg, initial encounter for closed fracture 08/06/2016   Essential hypertension 10/16/2015    Current Outpatient Medications  Medication Sig Dispense Refill   sodium bicarbonate  650 MG tablet Take 2 tablets (1,300 mg total) by mouth 3 (three) times daily. 180 tablet 3   sodium zirconium cyclosilicate  (LOKELMA ) 5 g packet mix and take 2 packets by mouth 2 (two) times daily. (Patient taking differently: Take 10 g by mouth daily. Once daily) 120 packet 3   NIFEdipine  (PROCARDIA  XL/NIFEDICAL-XL) 90 MG 24 hr tablet Take 1 tablet (90 mg total) by mouth daily. 90 tablet 3   ondansetron  (ZOFRAN ) 4 MG tablet Take 1 tablet (4 mg total) by mouth every 8 (eight) hours as needed for nausea. (Patient not taking: Reported on 12/02/2023) 20 tablet 0   No current facility-administered medications for this visit.    Allergies: Patient has no known allergies.  Past Medical History:  Diagnosis Date   Hypertension    states under control with med., has been on med. x 2 yr.   Trimalleolar fracture of ankle, closed 08/03/2016   right    Past Surgical History:  Procedure Laterality Date   NO PAST SURGERIES     ORIF ANKLE FRACTURE Right 08/07/2016   Procedure: OPEN REDUCTION INTERNAL FIXATION (ORIF) RIGHT ANKLE FRACTURE;   Surgeon: Wes Hamman, MD;  Location:  [facility];  Service: Orthopedics;  Laterality: Right;   RADIOLOGY WITH ANESTHESIA N/A 03/20/2023   Procedure: MRI OF ABDOMEN WITH AND WITHOUT CONTRAST WITH ANESTHESIA;  Surgeon: Radiologist, Medication, MD;  Location: [facility] OR;  Service: Radiology;  Laterality: N/A;    No family history on file.  Social History   Tobacco Use   Smoking status: Never   Smokeless tobacco: Never  Substance Use Topics   Alcohol use: No    Alcohol/week: 0.0 standard drinks of alcohol    Subjective:   Patient was hospitalized earlier this month due to elevated potassium. She had not been able to get a refill on her Lokelma  from her nephrologist. She is actually doing very well today. She did see her nephrologist last week and had labs re-checked which were normal. Scheduled to follow up with her oncology team at [facility] later this afternoon to follow up on imaging done last week/ repeat labs again today;    Objective:  Vitals:   12/02/23 0952  BP: 132/74  Pulse: (!) 51  SpO2: 98%  Weight: 174 lb 12.8 oz (79.3 kg)  Height: 5\' 6"  (1.676 m)    General: Well developed, well nourished, in no acute distress  Skin : Warm and dry.  Head: Normocephalic and atraumatic  Eyes: Sclera and conjunctiva clear; pupils round and reactive to light; extraocular movements intact  Ears: External normal; canals clear; tympanic membranes normal  Oropharynx: Pink, supple. No suspicious lesions  Neck: Supple  without thyromegaly, adenopathy  Lungs: Respirations unlabored; clear to auscultation bilaterally without wheeze, rales, rhonchi  CVS exam: normal rate and regular rhythm.  Neurologic: Alert and oriented; speech intact; face symmetrical; moves all extremities well; CNII-XII intact without focal deficit   Assessment:  1. Leiomyosarcoma of retroperitoneum (HCC)   2. Essential hypertension   3. Hyperkalemia     Plan:  Scheduled to see her oncologist later today to review  imaging and will be having labs done there today; has already seen nephrology in the past week and will be seeing them again next week;  She plans to ask her oncologist and nephrologist to complete FMLA for her as well;  Plan to follow up here in 6 months, sooner prn.   No follow-ups on file.  No orders of the defined types were placed in this encounter.   Requested Prescriptions   Signed Prescriptions Disp Refills   NIFEdipine  (PROCARDIA  XL/NIFEDICAL-XL) 90 MG 24 hr tablet 90 tablet 3    Sig: Take 1 tablet (90 mg total) by mouth daily.

## 2023-12-11 DIAGNOSIS — N2581 Secondary hyperparathyroidism of renal origin: Secondary | ICD-10-CM | POA: Diagnosis not present

## 2023-12-11 DIAGNOSIS — E875 Hyperkalemia: Secondary | ICD-10-CM | POA: Diagnosis not present

## 2023-12-11 DIAGNOSIS — N184 Chronic kidney disease, stage 4 (severe): Secondary | ICD-10-CM | POA: Diagnosis not present

## 2023-12-11 DIAGNOSIS — I129 Hypertensive chronic kidney disease with stage 1 through stage 4 chronic kidney disease, or unspecified chronic kidney disease: Secondary | ICD-10-CM | POA: Diagnosis not present

## 2023-12-11 DIAGNOSIS — D631 Anemia in chronic kidney disease: Secondary | ICD-10-CM | POA: Diagnosis not present

## 2023-12-11 DIAGNOSIS — Z905 Acquired absence of kidney: Secondary | ICD-10-CM | POA: Diagnosis not present

## 2023-12-11 DIAGNOSIS — C499 Malignant neoplasm of connective and soft tissue, unspecified: Secondary | ICD-10-CM | POA: Diagnosis not present

## 2023-12-11 LAB — LAB REPORT - SCANNED
Calcium: 9
EGFR: 24
PTH: 105

## 2023-12-15 ENCOUNTER — Other Ambulatory Visit (HOSPITAL_COMMUNITY): Payer: Self-pay

## 2023-12-16 ENCOUNTER — Other Ambulatory Visit (HOSPITAL_COMMUNITY): Payer: Self-pay

## 2023-12-29 ENCOUNTER — Other Ambulatory Visit (HOSPITAL_COMMUNITY): Payer: Self-pay

## 2023-12-29 DIAGNOSIS — N184 Chronic kidney disease, stage 4 (severe): Secondary | ICD-10-CM | POA: Diagnosis not present

## 2023-12-30 ENCOUNTER — Telehealth: Payer: Self-pay | Admitting: Pharmacy Technician

## 2023-12-30 NOTE — Telephone Encounter (Signed)
 Auth Submission: NO AUTH NEEDED Site of care: Site of care: [facility] Payer: [employer] Medication & CPT/J Code(s) submitted: Venofer (Iron Sucrose) J1756 Diagnosis Code:  Route of submission (phone, fax, portal):  Phone # Fax # Auth type: Buy/Bill HB Units/visits requested: 200mg  x5 doses Reference number:  Approval from: 12/30/23 to 04/30/24

## 2024-01-05 ENCOUNTER — Other Ambulatory Visit (HOSPITAL_COMMUNITY): Payer: Self-pay

## 2024-01-05 DIAGNOSIS — N184 Chronic kidney disease, stage 4 (severe): Secondary | ICD-10-CM | POA: Diagnosis not present

## 2024-01-06 DIAGNOSIS — N184 Chronic kidney disease, stage 4 (severe): Secondary | ICD-10-CM | POA: Diagnosis not present

## 2024-01-07 ENCOUNTER — Encounter (HOSPITAL_COMMUNITY): Payer: Self-pay | Admitting: *Deleted

## 2024-01-07 ENCOUNTER — Emergency Department (HOSPITAL_COMMUNITY)
Admission: EM | Admit: 2024-01-07 | Discharge: 2024-01-08 | Disposition: A | Discharge status: Home / Self Care | Attending: Emergency Medicine | Admitting: Emergency Medicine

## 2024-01-07 ENCOUNTER — Other Ambulatory Visit: Payer: Self-pay

## 2024-01-07 DIAGNOSIS — E875 Hyperkalemia: Secondary | ICD-10-CM | POA: Diagnosis not present

## 2024-01-07 DIAGNOSIS — E876 Hypokalemia: Secondary | ICD-10-CM | POA: Insufficient documentation

## 2024-01-07 DIAGNOSIS — E871 Hypo-osmolality and hyponatremia: Secondary | ICD-10-CM | POA: Diagnosis not present

## 2024-01-07 DIAGNOSIS — I129 Hypertensive chronic kidney disease with stage 1 through stage 4 chronic kidney disease, or unspecified chronic kidney disease: Secondary | ICD-10-CM | POA: Diagnosis not present

## 2024-01-07 DIAGNOSIS — N184 Chronic kidney disease, stage 4 (severe): Secondary | ICD-10-CM | POA: Diagnosis not present

## 2024-01-07 DIAGNOSIS — J449 Chronic obstructive pulmonary disease, unspecified: Secondary | ICD-10-CM | POA: Diagnosis not present

## 2024-01-07 DIAGNOSIS — R9431 Abnormal electrocardiogram [ECG] [EKG]: Secondary | ICD-10-CM | POA: Diagnosis not present

## 2024-01-07 LAB — URINALYSIS, ROUTINE W REFLEX MICROSCOPIC
Bacteria, UA: NONE SEEN
Bilirubin Urine: NEGATIVE
Glucose, UA: NEGATIVE mg/dL
Hgb urine dipstick: NEGATIVE
Ketones, ur: NEGATIVE mg/dL
Leukocytes,Ua: NEGATIVE
Nitrite: NEGATIVE
Protein, ur: 30 mg/dL — AB
Specific Gravity, Urine: 1.011 (ref 1.005–1.030)
pH: 8 (ref 5.0–8.0)

## 2024-01-07 LAB — COMPREHENSIVE METABOLIC PANEL WITH GFR
ALT: 9 U/L (ref 0–44)
AST: 12 U/L — ABNORMAL LOW (ref 15–41)
Albumin: 4.3 g/dL (ref 3.5–5.0)
Alkaline Phosphatase: 50 U/L (ref 38–126)
Anion gap: 11 (ref 5–15)
BUN: 56 mg/dL — ABNORMAL HIGH (ref 6–20)
CO2: 19 mmol/L — ABNORMAL LOW (ref 22–32)
Calcium: 9.7 mg/dL (ref 8.9–10.3)
Chloride: 103 mmol/L (ref 98–111)
Creatinine, Ser: 4.92 mg/dL — ABNORMAL HIGH (ref 0.44–1.00)
GFR, Estimated: 10 mL/min — ABNORMAL LOW (ref >60)
Glucose, Bld: 84 mg/dL (ref 70–99)
Potassium: >7.5 mmol/L (ref 3.5–5.1)
Sodium: 133 mmol/L — ABNORMAL LOW (ref 135–145)
Total Bilirubin: 0.4 mg/dL (ref 0.0–1.2)
Total Protein: 8.8 g/dL — ABNORMAL HIGH (ref 6.5–8.1)

## 2024-01-07 LAB — RENAL FUNCTION PANEL
Albumin: 3.7 g/dL (ref 3.5–5.0)
Anion gap: 8 (ref 5–15)
BUN: 54 mg/dL — ABNORMAL HIGH (ref 6–20)
CO2: 19 mmol/L — ABNORMAL LOW (ref 22–32)
Calcium: 9.6 mg/dL (ref 8.9–10.3)
Chloride: 107 mmol/L (ref 98–111)
Creatinine, Ser: 4.44 mg/dL — ABNORMAL HIGH (ref 0.44–1.00)
GFR, Estimated: 11 mL/min — ABNORMAL LOW
Glucose, Bld: 92 mg/dL (ref 70–99)
Phosphorus: 4.6 mg/dL (ref 2.5–4.6)
Potassium: 5.4 mmol/L — ABNORMAL HIGH (ref 3.5–5.1)
Sodium: 134 mmol/L — ABNORMAL LOW (ref 135–145)

## 2024-01-07 LAB — CBC WITH DIFFERENTIAL/PLATELET
Abs Immature Granulocytes: 0.02 10*3/uL (ref 0.00–0.07)
Basophils Absolute: 0.1 10*3/uL (ref 0.0–0.1)
Basophils Relative: 1 %
Eosinophils Absolute: 0.1 10*3/uL (ref 0.0–0.5)
Eosinophils Relative: 2 %
HCT: 33.6 % — ABNORMAL LOW (ref 36.0–46.0)
Hemoglobin: 10.6 g/dL — ABNORMAL LOW (ref 12.0–15.0)
Immature Granulocytes: 0 %
Lymphocytes Relative: 41 %
Lymphs Abs: 2.6 10*3/uL (ref 0.7–4.0)
MCH: 29 pg (ref 26.0–34.0)
MCHC: 31.5 g/dL (ref 30.0–36.0)
MCV: 91.8 fL (ref 80.0–100.0)
Monocytes Absolute: 0.3 10*3/uL (ref 0.1–1.0)
Monocytes Relative: 5 %
Neutro Abs: 3.2 10*3/uL (ref 1.7–7.7)
Neutrophils Relative %: 51 %
Platelets: 392 10*3/uL (ref 150–400)
RBC: 3.66 MIL/uL — ABNORMAL LOW (ref 3.87–5.11)
RDW: 12.9 % (ref 11.5–15.5)
WBC: 6.4 10*3/uL (ref 4.0–10.5)
nRBC: 0 % (ref 0.0–0.2)

## 2024-01-07 LAB — CBG MONITORING, ED
Glucose-Capillary: 118 mg/dL — ABNORMAL HIGH (ref 70–99)
Glucose-Capillary: 51 mg/dL — ABNORMAL LOW (ref 70–99)

## 2024-01-07 MED ORDER — LOKELMA 10 G PO PACK
10.0000 g | PACK | Freq: Every day | ORAL | 0 refills | Status: AC
  Filled 2024-01-07: qty 3, 3d supply, fill #0

## 2024-01-07 MED ORDER — DEXTROSE 50 % IV SOLN
1.0000 | Freq: Once | INTRAVENOUS | Status: AC
  Administered 2024-01-07: 50 mL via INTRAVENOUS
  Filled 2024-01-07: qty 50

## 2024-01-07 MED ORDER — SODIUM ZIRCONIUM CYCLOSILICATE 10 G PO PACK
10.0000 g | PACK | Freq: Once | ORAL | Status: AC
  Administered 2024-01-07: 10 g via ORAL
  Filled 2024-01-07: qty 1

## 2024-01-07 MED ORDER — SODIUM CHLORIDE 0.9 % IV BOLUS
1000.0000 mL | Freq: Once | INTRAVENOUS | Status: AC
Start: 2024-01-07 — End: 2024-01-07
  Administered 2024-01-07: 1000 mL via INTRAVENOUS

## 2024-01-07 MED ORDER — CALCIUM GLUCONATE-NACL 1-0.675 GM/50ML-% IV SOLN
1.0000 g | Freq: Once | INTRAVENOUS | Status: AC
  Administered 2024-01-07: 1000 mg via INTRAVENOUS
  Filled 2024-01-07: qty 50

## 2024-01-07 MED ORDER — DEXTROSE 50 % IV SOLN: 1.0000 | Freq: Once | INTRAVENOUS | Status: DC

## 2024-01-07 MED ORDER — INSULIN ASPART 100 UNIT/ML IV SOLN
5.0000 [IU] | Freq: Once | INTRAVENOUS | Status: AC
  Administered 2024-01-07: 5 [IU] via INTRAVENOUS

## 2024-01-07 NOTE — ED Provider Triage Note (Signed)
 Emergency Medicine Provider Triage Evaluation Note  Colleen Potter , a 58 y.o. female  was evaluated in triage.  Pt complains of abnormal labs.  Did see nephrologist yesterday, sent in for critical levels.  Asymptomatic at this time.  Review of Systems  Positive:  Negative:   Physical Exam  BP (!) 157/96   Pulse 84   Temp 98.1 F (36.7 C)   Resp 18   SpO2 100%  Gen:   Awake, no distress   Resp:  Normal effort  MSK:   Moves extremities without difficulty  Other:    Medical Decision Making  Medically screening exam initiated at 4:35 PM.  Appropriate orders placed.  Colleen Potter was informed that the remainder of the evaluation will be completed by another provider, this initial triage assessment does not replace that evaluation, and the importance of remaining in the ED until their evaluation is complete.     Alyxandria Wentz, PA-C 01/07/24 1651

## 2024-01-07 NOTE — Discharge Instructions (Addendum)
 It was a pleasure caring for you today in the emergency department.  Please restart your lokelma  Drink plenty of fluids Please follow-up with Dr. Dennise for recheck of your potassium  Please return to the emergency department for any worsening or worrisome symptoms.

## 2024-01-07 NOTE — ED Notes (Signed)
 Triage PA / charge nurse notified on patient's elevated potassium result .

## 2024-01-07 NOTE — ED Provider Notes (Signed)
 [place] EMERGENCY DEPARTMENT AT [facility] Provider Note  CSN: [id number] Arrival date & time: 01/07/24 1530  Chief Complaint(s) Abnormal Lab  HPI Colleen Potter is a 58 y.o. female with past medical history as below, significant for hypertension, COPD, CKD, leiomyosarcoma who presents to the ED with complaint of hyperkalemia  She had outpatient labs drawn recently, she was called this morning vies come to the ER for elevated potassium.  Patient reports she was recently taken off of Lokelma  because her potassium was too low.  She has not been taking the potassium supplementation, no diuretics.  No change in bowel or bladder function, no change in p.o. intake.  No vomiting.  No belly pain.  She has no complaints at this time  She follows with Dr. Dennise at [facility]  Past Medical History Past Medical History:  Diagnosis Date   Hypertension    states under control with med., has been on med. x 2 yr.   Trimalleolar fracture of ankle, closed 08/03/2016   right   Patient Active Problem List   Diagnosis Date Noted   COPD suggested by initial evaluation (HCC) 11/19/2023   AKI (acute kidney injury) (HCC) 11/19/2023   Hyperglycemia 11/19/2023   Hyperkalemia 09/18/2023   CKD (chronic kidney disease), stage IV (HCC) 09/18/2023   Thrombocytosis 09/18/2023   Anemia in chronic renal disease 09/18/2023   Compensated metabolic acidosis 09/18/2023   Leiomyosarcoma of retroperitoneum (HCC) 09/03/2023   Bradycardia 09/03/2023   Acute medial meniscus tear of left knee 12/04/2021   Chronic venous insufficiency 05/12/2018   Displaced trimalleolar fracture of right lower leg, initial encounter for closed fracture 08/06/2016   Essential hypertension 10/16/2015   Home Medication(s) Prior to Admission medications   Medication Sig Start Date End Date Taking? Authorizing Provider  sodium zirconium cyclosilicate  (LOKELMA ) 10 g PACK packet Take 10 g by mouth daily for 3 days. 01/08/24  01/11/24 Yes Elnor Jayson LABOR, DO  NIFEdipine  (PROCARDIA  XL/NIFEDICAL-XL) 90 MG 24 hr tablet Take 1 tablet (90 mg total) by mouth daily. 12/02/23   Jason Leita Repine, FNP  ondansetron  (ZOFRAN ) 4 MG tablet Take 1 tablet (4 mg total) by mouth every 8 (eight) hours as needed for nausea. Patient not taking: Reported on 12/02/2023 11/21/23   Rai, Nydia POUR, MD  potassium chloride  SA (KLOR-CON  M) 20 MEQ tablet Take 1 tablet (20 mEq total) by mouth once daily for 3 doses 12/02/23     sodium bicarbonate  650 MG tablet Take 2 tablets (1,300 mg total) by mouth 3 (three) times daily. 11/21/23   Rai, Nydia POUR, MD  sodium zirconium cyclosilicate  (LOKELMA ) 5 g packet mix and take 2 packets by mouth 2 (two) times daily. Patient taking differently: Take 10 g by mouth daily. Once daily 11/21/23   Davia Nydia POUR, MD  Past Surgical History Past Surgical History:  Procedure Laterality Date   NO PAST SURGERIES     ORIF ANKLE FRACTURE Right 08/07/2016   Procedure: OPEN REDUCTION INTERNAL FIXATION (ORIF) RIGHT ANKLE FRACTURE;  Surgeon: Kay CHRISTELLA Cummins, MD;  Location: [facility];  Service: Orthopedics;  Laterality: Right;   RADIOLOGY WITH ANESTHESIA N/A 03/20/2023   Procedure: MRI OF ABDOMEN WITH AND WITHOUT CONTRAST WITH ANESTHESIA;  Surgeon: Radiologist, Medication, MD;  Location: [facility];  Service: Radiology;  Laterality: N/A;   Family History History reviewed. No pertinent family history.  Social History Social History   Tobacco Use   Smoking status: Never   Smokeless tobacco: Never  Vaping Use   Vaping status: Never Used  Substance Use Topics   Alcohol use: No    Alcohol/week: 0.0 standard drinks of alcohol   Drug use: No   Allergies Patient has no known allergies.  Review of Systems A thorough review of systems was obtained and all systems are negative except as  noted in the HPI and PMH.   Physical Exam Vital Signs  I have reviewed the triage vital signs BP 135/87   Pulse 89   Temp (!) 97.1 F (36.2 C) (Oral)   Resp 19   Ht 5' 6 (1.676 m)   Wt 79.3 kg   SpO2 100%   BMI 28.22 kg/m  Physical Exam Vitals and nursing note reviewed.  Constitutional:      General: She is not in acute distress.    Appearance: Normal appearance. She is well-developed. She is not ill-appearing.  HENT:     Head: Normocephalic and atraumatic.     Right Ear: External ear normal.     Left Ear: External ear normal.     Nose: Nose normal.     Mouth/Throat:     Mouth: Mucous membranes are moist.   Eyes:     General: No scleral icterus.       Right eye: No discharge.        Left eye: No discharge.    Cardiovascular:     Rate and Rhythm: Normal rate.  Pulmonary:     Effort: Pulmonary effort is normal. No respiratory distress.     Breath sounds: No stridor.  Abdominal:     General: Abdomen is flat. There is no distension.     Tenderness: There is no guarding.   Musculoskeletal:        General: No deformity.     Cervical back: No rigidity.   Skin:    General: Skin is warm and dry.     Coloration: Skin is not cyanotic, jaundiced or pale.   Neurological:     Mental Status: She is alert and oriented to person, place, and time.     GCS: GCS eye subscore is 4. GCS verbal subscore is 5. GCS motor subscore is 6.   Psychiatric:        Speech: Speech normal.        Behavior: Behavior normal. Behavior is cooperative.     ED Results and Treatments Labs (all labs ordered are listed, but only abnormal results are displayed) Labs Reviewed  CBC WITH DIFFERENTIAL/PLATELET - Abnormal; Notable for the following components:      Result Value   RBC 3.66 (*)    Hemoglobin 10.6 (*)    HCT 33.6 (*)    All other components within normal limits  COMPREHENSIVE METABOLIC PANEL WITH GFR - Abnormal; Notable for the following components:  Sodium 133 (*)    Potassium  >7.5 (*)    CO2 19 (*)    BUN 56 (*)    Creatinine, Ser 4.92 (*)    Total Protein 8.8 (*)    AST 12 (*)    GFR, Estimated 10 (*)    All other components within normal limits  URINALYSIS, ROUTINE W REFLEX MICROSCOPIC - Abnormal; Notable for the following components:   Color, Urine STRAW (*)    Protein, ur 30 (*)    All other components within normal limits  RENAL FUNCTION PANEL - Abnormal; Notable for the following components:   Sodium 134 (*)    Potassium 5.4 (*)    CO2 19 (*)    BUN 54 (*)    Creatinine, Ser 4.44 (*)    GFR, Estimated 11 (*)    All other components within normal limits  CBG MONITORING, ED - Abnormal; Notable for the following components:   Glucose-Capillary 51 (*)    All other components within normal limits  CBG MONITORING, ED - Abnormal; Notable for the following components:   Glucose-Capillary 118 (*)    All other components within normal limits  I-STAT CHEM 8, ED                                                                                                                          Radiology No results found.  Pertinent labs & imaging results that were available during my care of the patient were reviewed by me and considered in my medical decision making (see MDM for details).  Medications Ordered in ED Medications  sodium zirconium cyclosilicate  (LOKELMA ) packet 10 g (10 g Oral Given 01/07/24 2053)  sodium chloride  0.9 % bolus 1,000 mL (0 mLs Intravenous Stopped 01/07/24 2142)  insulin  aspart (novoLOG ) injection 5 Units (5 Units Intravenous Given 01/07/24 2053)    And  dextrose  50 % solution 50 mL (50 mLs Intravenous Given 01/07/24 2053)  calcium  gluconate 1 g/ 50 mL sodium chloride  IVPB (0 mg Intravenous Stopped 01/07/24 2120)                                                                                                                                     Procedures .Critical Care  Performed by: Elnor Jayson LABOR, DO Authorized by: Elnor Jayson LABOR, DO    Critical care provider statement:    Critical care  time (minutes):  30   Critical care time was exclusive of:  Separately billable procedures and treating other patients   Critical care was necessary to treat or prevent imminent or life-threatening deterioration of the following conditions:  Metabolic crisis   Critical care was time spent personally by me on the following activities:  Development of treatment plan with patient or surrogate, discussions with consultants, evaluation of patient's response to treatment, examination of patient, ordering and review of laboratory studies, ordering and review of radiographic studies, ordering and performing treatments and interventions, pulse oximetry, re-evaluation of patient's condition, review of old charts and obtaining history from patient or surrogate   (including critical care time)  Medical Decision Making / ED Course    Medical Decision Making:    SHAASIA ODLE is a 58 y.o. female with past medical history as below, significant for hypertension, COPD, CKD, leiomyosarcoma who presents to the ED with complaint of hyperkalemia. The complaint involves an extensive differential diagnosis and also carries with it a high risk of complications and morbidity.  Serious etiology was considered. Ddx includes but is not limited to: Renal dysfunction, medication effect, dehydration, etc.  Complete initial physical exam performed, notably the patient was in no acute distress, sitting comfortably on stretcher.    Reviewed and confirmed nursing documentation for past medical history, family history, social history.  Vital signs reviewed.     Brief summary:  58 year old female history of CKD follows Dr. Dennise SERUM here with hyperkalemia  Lokelma  recently discontinued She has no acute EKG changes Placed on telemetry monitoring. Will provide temporizing measures, IV fluids.  Recheck potassium. Likely will need to admit patient secondary to profound  hyperkalemia  Clinical Course as of 01/08/24 0001  Wed Jan 07, 2024  2019 Creatinine(!): 4.92 Mildly increased from prior, give IVF here. recheck [SG]  2019 CO2(!): 19 Similar prior [SG]  2324 K improved now down to 5.4. I did recommend admission for the pt but she wants to go home [SG]  2358 Creatinine(!): 4.44 Cr improved after fluids, K improved to 5.4 [SG]    Clinical Course User Index [SG] Elnor Savant A, DO     She remains asymptomatic, potassium is improved, hemodynamically she is stable.  Will restart her Lokelma , she will follow-up with her nephrologist later this week for recheck of her labs.  Patient in no distress and overall condition is stable. Detailed discussions were had with the patient/guardian regarding current findings, and need for close f/u with PCP or on call doctor. The patient/guardian has been instructed to return immediately if the symptoms worsen in any way for re-evaluation. Patient/guardian verbalized understanding and is in agreement with current care plan. All questions answered prior to discharge.           Additional history obtained: -Additional history obtained from friend -External records from outside source obtained and reviewed including: Chart review including previous notes, labs, imaging, consultation notes including  Prior labs, medications   Lab Tests: -I ordered, reviewed, and interpreted labs.   The pertinent results include:   Labs Reviewed  CBC WITH DIFFERENTIAL/PLATELET - Abnormal; Notable for the following components:      Result Value   RBC 3.66 (*)    Hemoglobin 10.6 (*)    HCT 33.6 (*)    All other components within normal limits  COMPREHENSIVE METABOLIC PANEL WITH GFR - Abnormal; Notable for the following components:   Sodium 133 (*)    Potassium >7.5 (*)    CO2  19 (*)    BUN 56 (*)    Creatinine, Ser 4.92 (*)    Total Protein 8.8 (*)    AST 12 (*)    GFR, Estimated 10 (*)    All other components within  normal limits  URINALYSIS, ROUTINE W REFLEX MICROSCOPIC - Abnormal; Notable for the following components:   Color, Urine STRAW (*)    Protein, ur 30 (*)    All other components within normal limits  RENAL FUNCTION PANEL - Abnormal; Notable for the following components:   Sodium 134 (*)    Potassium 5.4 (*)    CO2 19 (*)    BUN 54 (*)    Creatinine, Ser 4.44 (*)    GFR, Estimated 11 (*)    All other components within normal limits  CBG MONITORING, ED - Abnormal; Notable for the following components:   Glucose-Capillary 51 (*)    All other components within normal limits  CBG MONITORING, ED - Abnormal; Notable for the following components:   Glucose-Capillary 118 (*)    All other components within normal limits  I-STAT CHEM 8, ED    Notable for as above  EKG   EKG Interpretation Date/Time:  Wednesday January 07 2024 20:07:00 EDT Ventricular Rate:  87 PR Interval:  158 QRS Duration:  80 QT Interval:  329 QTC Calculation: 396 R Axis:   3  Text Interpretation: Sinus rhythm similar prior Confirmed by Elnor Savant ([id number]) on 01/07/2024 8:20:05 PM         Imaging Studies ordered: na   Medicines ordered and prescription drug management: Meds ordered this encounter  Medications   sodium zirconium cyclosilicate  (LOKELMA ) packet 10 g   sodium chloride  0.9 % bolus 1,000 mL   AND Linked Order Group    insulin  aspart (novoLOG ) injection 5 Units    dextrose  50 % solution 50 mL   calcium  gluconate 1 g/ 50 mL sodium chloride  IVPB   DISCONTD: dextrose  50 % solution 50 mL   sodium zirconium cyclosilicate  (LOKELMA ) 10 g PACK packet    Sig: Take 10 g by mouth daily for 3 days.    Dispense:  3 packet    Refill:  0    -I have reviewed the patients home medicines and have made adjustments as needed   Consultations Obtained: na   Cardiac Monitoring: The patient was maintained on a cardiac monitor.  I personally viewed and interpreted the cardiac monitored which showed an  underlying rhythm of: nsr Continuous pulse oximetry interpreted by myself, 100% on ra.    Social Determinants of Health:  Diagnosis or treatment significantly limited by social determinants of health: na   Reevaluation: After the interventions noted above, I reevaluated the patient and found that they have stayed the same  Co morbidities that complicate the patient evaluation  Past Medical History:  Diagnosis Date   Hypertension    states under control with med., has been on med. x 2 yr.   Trimalleolar fracture of ankle, closed 08/03/2016   right      Dispostion: Disposition decision including need for hospitalization was considered, and patient discharged from emergency department.    Final Clinical Impression(s) / ED Diagnoses Final diagnoses:  Hyperkalemia        Elnor Savant LABOR, DO 01/08/24 0001

## 2024-01-07 NOTE — ED Triage Notes (Signed)
 The pt had blood drawn yesterday and she was called today and she thinks that her potassium is abnormal  she has kidney disease

## 2024-01-08 ENCOUNTER — Other Ambulatory Visit (HOSPITAL_COMMUNITY): Payer: Self-pay

## 2024-01-08 NOTE — ED Notes (Signed)
Pt discharged. Pt given discharge papers and papers explained. Pt in NAD at this time

## 2024-01-09 ENCOUNTER — Other Ambulatory Visit: Payer: Self-pay | Admitting: Nephrology

## 2024-01-09 ENCOUNTER — Other Ambulatory Visit (HOSPITAL_COMMUNITY): Payer: Self-pay

## 2024-01-09 DIAGNOSIS — I129 Hypertensive chronic kidney disease with stage 1 through stage 4 chronic kidney disease, or unspecified chronic kidney disease: Secondary | ICD-10-CM | POA: Diagnosis not present

## 2024-01-09 DIAGNOSIS — N184 Chronic kidney disease, stage 4 (severe): Secondary | ICD-10-CM

## 2024-01-09 DIAGNOSIS — D631 Anemia in chronic kidney disease: Secondary | ICD-10-CM | POA: Diagnosis not present

## 2024-01-09 DIAGNOSIS — E875 Hyperkalemia: Secondary | ICD-10-CM | POA: Diagnosis not present

## 2024-01-09 DIAGNOSIS — N2581 Secondary hyperparathyroidism of renal origin: Secondary | ICD-10-CM | POA: Diagnosis not present

## 2024-01-09 DIAGNOSIS — Z905 Acquired absence of kidney: Secondary | ICD-10-CM | POA: Diagnosis not present

## 2024-01-12 ENCOUNTER — Encounter (HOSPITAL_COMMUNITY)

## 2024-01-14 ENCOUNTER — Encounter (HOSPITAL_COMMUNITY)

## 2024-01-19 ENCOUNTER — Encounter (HOSPITAL_COMMUNITY)

## 2024-01-20 ENCOUNTER — Other Ambulatory Visit (HOSPITAL_COMMUNITY): Payer: Self-pay

## 2024-01-20 ENCOUNTER — Other Ambulatory Visit: Payer: Self-pay | Admitting: Family

## 2024-01-20 ENCOUNTER — Encounter (HOSPITAL_COMMUNITY): Payer: Self-pay

## 2024-01-20 MED ORDER — LOKELMA 10 G PO PACK
PACK | ORAL | 5 refills | Status: DC
  Filled 2024-01-20: qty 30, 30d supply, fill #0
  Filled 2024-02-15: qty 30, 30d supply, fill #1
  Filled 2024-03-16: qty 30, 30d supply, fill #2
  Filled 2024-05-20: qty 30, 30d supply, fill #3
  Filled 2024-06-18 – 2024-07-25 (×2): qty 30, 30d supply, fill #4

## 2024-01-21 ENCOUNTER — Other Ambulatory Visit: Payer: Self-pay

## 2024-01-21 ENCOUNTER — Encounter (HOSPITAL_COMMUNITY)

## 2024-01-23 ENCOUNTER — Encounter (HOSPITAL_COMMUNITY)

## 2024-02-02 ENCOUNTER — Other Ambulatory Visit (HOSPITAL_COMMUNITY): Payer: Self-pay

## 2024-02-02 DIAGNOSIS — N184 Chronic kidney disease, stage 4 (severe): Secondary | ICD-10-CM | POA: Diagnosis not present

## 2024-02-02 DIAGNOSIS — C499 Malignant neoplasm of connective and soft tissue, unspecified: Secondary | ICD-10-CM | POA: Diagnosis not present

## 2024-02-02 DIAGNOSIS — D631 Anemia in chronic kidney disease: Secondary | ICD-10-CM | POA: Diagnosis not present

## 2024-02-02 DIAGNOSIS — N2581 Secondary hyperparathyroidism of renal origin: Secondary | ICD-10-CM | POA: Diagnosis not present

## 2024-02-02 DIAGNOSIS — I129 Hypertensive chronic kidney disease with stage 1 through stage 4 chronic kidney disease, or unspecified chronic kidney disease: Secondary | ICD-10-CM | POA: Diagnosis not present

## 2024-02-02 DIAGNOSIS — Z905 Acquired absence of kidney: Secondary | ICD-10-CM | POA: Diagnosis not present

## 2024-02-02 LAB — CBC AND DIFFERENTIAL
HCT: 32 — AB (ref 36–46)
Hemoglobin: 10.3 — AB (ref 12.0–16.0)
Neutrophils Absolute: 4.1
Platelets: 371 K/uL (ref 150–400)
WBC: 6.5

## 2024-02-02 LAB — COMPREHENSIVE METABOLIC PANEL WITH GFR
Albumin: 4.5 (ref 3.5–5.0)
Calcium: 9.6 (ref 8.7–10.7)
eGFR: 18

## 2024-02-02 LAB — BASIC METABOLIC PANEL WITH GFR
BUN: 29 — AB (ref 4–21)
CO2: 25 — AB (ref 13–22)
Chloride: 103 (ref 99–108)
Creatinine: 2.9 — AB (ref 0.5–1.1)
Glucose: 86
Potassium: 4.5 meq/L (ref 3.5–5.1)
Sodium: 142 (ref 137–147)

## 2024-02-02 LAB — IRON,TIBC AND FERRITIN PANEL
%SAT: 12
Ferritin: 37
Iron: 37
TIBC: 315
UIBC: 278

## 2024-02-02 LAB — VITAMIN D 25 HYDROXY (VIT D DEFICIENCY, FRACTURES): Vit D, 25-Hydroxy: <5

## 2024-02-02 LAB — CBC: RBC: 3.58 — AB (ref 3.87–5.11)

## 2024-02-09 ENCOUNTER — Encounter: Payer: Self-pay | Admitting: Family

## 2024-02-15 ENCOUNTER — Other Ambulatory Visit (HOSPITAL_COMMUNITY): Payer: Self-pay

## 2024-02-16 ENCOUNTER — Other Ambulatory Visit: Payer: Self-pay

## 2024-02-16 ENCOUNTER — Other Ambulatory Visit (HOSPITAL_COMMUNITY): Payer: Self-pay

## 2024-02-17 ENCOUNTER — Other Ambulatory Visit (HOSPITAL_COMMUNITY): Payer: Self-pay

## 2024-02-20 ENCOUNTER — Other Ambulatory Visit (HOSPITAL_COMMUNITY): Payer: Self-pay

## 2024-02-27 ENCOUNTER — Other Ambulatory Visit (HOSPITAL_COMMUNITY): Payer: Self-pay

## 2024-03-04 DIAGNOSIS — D631 Anemia in chronic kidney disease: Secondary | ICD-10-CM | POA: Diagnosis not present

## 2024-03-04 DIAGNOSIS — Z905 Acquired absence of kidney: Secondary | ICD-10-CM | POA: Diagnosis not present

## 2024-03-04 DIAGNOSIS — N184 Chronic kidney disease, stage 4 (severe): Secondary | ICD-10-CM | POA: Diagnosis not present

## 2024-03-04 DIAGNOSIS — C499 Malignant neoplasm of connective and soft tissue, unspecified: Secondary | ICD-10-CM | POA: Diagnosis not present

## 2024-03-04 DIAGNOSIS — I129 Hypertensive chronic kidney disease with stage 1 through stage 4 chronic kidney disease, or unspecified chronic kidney disease: Secondary | ICD-10-CM | POA: Diagnosis not present

## 2024-03-04 DIAGNOSIS — N2581 Secondary hyperparathyroidism of renal origin: Secondary | ICD-10-CM | POA: Diagnosis not present

## 2024-03-04 DIAGNOSIS — N189 Chronic kidney disease, unspecified: Secondary | ICD-10-CM | POA: Diagnosis not present

## 2024-03-16 ENCOUNTER — Other Ambulatory Visit: Payer: Self-pay

## 2024-03-16 ENCOUNTER — Other Ambulatory Visit (HOSPITAL_COMMUNITY): Payer: Self-pay

## 2024-03-22 ENCOUNTER — Other Ambulatory Visit (HOSPITAL_COMMUNITY): Payer: Self-pay

## 2024-03-22 ENCOUNTER — Other Ambulatory Visit: Payer: Self-pay

## 2024-03-24 ENCOUNTER — Other Ambulatory Visit: Payer: Self-pay

## 2024-03-24 ENCOUNTER — Other Ambulatory Visit: Payer: Self-pay | Admitting: Family

## 2024-03-24 ENCOUNTER — Other Ambulatory Visit (HOSPITAL_COMMUNITY): Payer: Self-pay

## 2024-03-25 ENCOUNTER — Other Ambulatory Visit (HOSPITAL_COMMUNITY): Payer: Self-pay

## 2024-03-25 MED ORDER — SODIUM BICARBONATE 650 MG PO TABS
ORAL_TABLET | ORAL | 5 refills | Status: AC
  Filled 2024-03-25: qty 180, 30d supply, fill #0
  Filled 2024-04-19: qty 180, 30d supply, fill #1
  Filled 2024-05-20: qty 180, 30d supply, fill #0
  Filled 2024-05-20: qty 180, 30d supply, fill #2
  Filled 2024-06-18: qty 180, 30d supply, fill #1
  Filled 2024-07-25: qty 180, 30d supply, fill #2

## 2024-03-26 ENCOUNTER — Other Ambulatory Visit (HOSPITAL_COMMUNITY): Payer: Self-pay

## 2024-04-13 ENCOUNTER — Other Ambulatory Visit (HOSPITAL_COMMUNITY): Payer: Self-pay

## 2024-04-13 DIAGNOSIS — N2581 Secondary hyperparathyroidism of renal origin: Secondary | ICD-10-CM | POA: Diagnosis not present

## 2024-04-13 DIAGNOSIS — D631 Anemia in chronic kidney disease: Secondary | ICD-10-CM | POA: Diagnosis not present

## 2024-04-13 DIAGNOSIS — I129 Hypertensive chronic kidney disease with stage 1 through stage 4 chronic kidney disease, or unspecified chronic kidney disease: Secondary | ICD-10-CM | POA: Diagnosis not present

## 2024-04-13 DIAGNOSIS — N189 Chronic kidney disease, unspecified: Secondary | ICD-10-CM | POA: Diagnosis not present

## 2024-04-13 DIAGNOSIS — E875 Hyperkalemia: Secondary | ICD-10-CM | POA: Diagnosis not present

## 2024-04-13 DIAGNOSIS — N184 Chronic kidney disease, stage 4 (severe): Secondary | ICD-10-CM | POA: Diagnosis not present

## 2024-04-13 DIAGNOSIS — Z905 Acquired absence of kidney: Secondary | ICD-10-CM | POA: Diagnosis not present

## 2024-04-13 DIAGNOSIS — C499 Malignant neoplasm of connective and soft tissue, unspecified: Secondary | ICD-10-CM | POA: Diagnosis not present

## 2024-04-14 ENCOUNTER — Other Ambulatory Visit (HOSPITAL_COMMUNITY): Payer: Self-pay

## 2024-04-14 MED ORDER — LOKELMA 10 G PO PACK
PACK | ORAL | 5 refills | Status: AC
  Filled 2024-04-14 (×2): qty 30, 30d supply, fill #0
  Filled 2024-05-20 – 2024-06-18 (×2): qty 30, 30d supply, fill #1
  Filled 2024-07-25: qty 30, 30d supply, fill #2

## 2024-04-19 ENCOUNTER — Other Ambulatory Visit (HOSPITAL_COMMUNITY): Payer: Self-pay

## 2024-04-21 ENCOUNTER — Other Ambulatory Visit: Payer: Self-pay

## 2024-04-21 ENCOUNTER — Other Ambulatory Visit (HOSPITAL_COMMUNITY): Payer: Self-pay

## 2024-04-21 MED ORDER — FERROUS GLUCONATE 240 (27 FE) MG PO TABS
ORAL_TABLET | ORAL | 2 refills | Status: AC
  Filled 2024-04-21 (×2): qty 60, 30d supply, fill #0
  Filled 2024-05-26: qty 60, 30d supply, fill #1
  Filled 2024-07-11 – 2024-07-25 (×2): qty 60, 30d supply, fill #2

## 2024-04-26 ENCOUNTER — Other Ambulatory Visit (HOSPITAL_COMMUNITY): Payer: Self-pay

## 2024-05-17 ENCOUNTER — Encounter: Payer: Self-pay | Admitting: Radiology

## 2024-05-20 ENCOUNTER — Other Ambulatory Visit (HOSPITAL_COMMUNITY): Payer: Self-pay

## 2024-05-20 ENCOUNTER — Other Ambulatory Visit: Payer: Self-pay

## 2024-05-24 ENCOUNTER — Other Ambulatory Visit (HOSPITAL_COMMUNITY): Payer: Self-pay

## 2024-05-25 ENCOUNTER — Other Ambulatory Visit (HOSPITAL_COMMUNITY): Payer: Self-pay

## 2024-05-25 ENCOUNTER — Other Ambulatory Visit: Payer: Self-pay | Admitting: Family

## 2024-05-25 MED ORDER — POTASSIUM CHLORIDE CRYS ER 20 MEQ PO TBCR
20.0000 meq | EXTENDED_RELEASE_TABLET | Freq: Every day | ORAL | 0 refills | Status: DC
  Filled 2024-05-25: qty 3, 3d supply, fill #0

## 2024-05-26 ENCOUNTER — Other Ambulatory Visit (HOSPITAL_COMMUNITY): Payer: Self-pay

## 2024-05-26 ENCOUNTER — Other Ambulatory Visit: Payer: Self-pay

## 2024-05-27 ENCOUNTER — Other Ambulatory Visit (HOSPITAL_COMMUNITY): Payer: Self-pay

## 2024-06-01 ENCOUNTER — Other Ambulatory Visit (HOSPITAL_COMMUNITY): Payer: Self-pay

## 2024-06-04 DIAGNOSIS — J9811 Atelectasis: Secondary | ICD-10-CM | POA: Diagnosis not present

## 2024-06-04 DIAGNOSIS — C480 Malignant neoplasm of retroperitoneum: Secondary | ICD-10-CM | POA: Diagnosis not present

## 2024-06-08 DIAGNOSIS — C480 Malignant neoplasm of retroperitoneum: Secondary | ICD-10-CM | POA: Diagnosis not present

## 2024-06-16 DIAGNOSIS — D631 Anemia in chronic kidney disease: Secondary | ICD-10-CM | POA: Diagnosis not present

## 2024-06-16 DIAGNOSIS — E875 Hyperkalemia: Secondary | ICD-10-CM | POA: Diagnosis not present

## 2024-06-16 DIAGNOSIS — I129 Hypertensive chronic kidney disease with stage 1 through stage 4 chronic kidney disease, or unspecified chronic kidney disease: Secondary | ICD-10-CM | POA: Diagnosis not present

## 2024-06-16 DIAGNOSIS — N2581 Secondary hyperparathyroidism of renal origin: Secondary | ICD-10-CM | POA: Diagnosis not present

## 2024-06-16 DIAGNOSIS — N184 Chronic kidney disease, stage 4 (severe): Secondary | ICD-10-CM | POA: Diagnosis not present

## 2024-06-16 DIAGNOSIS — R809 Proteinuria, unspecified: Secondary | ICD-10-CM | POA: Diagnosis not present

## 2024-06-18 ENCOUNTER — Other Ambulatory Visit (HOSPITAL_COMMUNITY): Payer: Self-pay

## 2024-06-18 ENCOUNTER — Other Ambulatory Visit: Payer: Self-pay

## 2024-06-22 ENCOUNTER — Other Ambulatory Visit (HOSPITAL_COMMUNITY): Payer: Self-pay

## 2024-06-28 ENCOUNTER — Other Ambulatory Visit: Payer: Self-pay | Admitting: Family Medicine

## 2024-06-28 DIAGNOSIS — Z1231 Encounter for screening mammogram for malignant neoplasm of breast: Secondary | ICD-10-CM

## 2024-07-11 ENCOUNTER — Other Ambulatory Visit (HOSPITAL_COMMUNITY): Payer: Self-pay

## 2024-07-22 ENCOUNTER — Other Ambulatory Visit (HOSPITAL_COMMUNITY): Payer: Self-pay

## 2024-07-26 ENCOUNTER — Other Ambulatory Visit (HOSPITAL_COMMUNITY): Payer: Self-pay

## 2024-07-26 ENCOUNTER — Other Ambulatory Visit: Payer: Self-pay

## 2024-08-10 NOTE — Progress Notes (Signed)
 "   New Patient Office Visit   Subjective     Patient ID: Colleen Potter, female   DOB: [birth year]  Age: 59 y.o. MRN: [id number]   CC:  No chief complaint on file.     HPI Colleen Potter presents to establish care.   Leiomyosarcoma (Retroperitoneum): - Followed by [facility]: Oncologist, Dr. Zani, Radiologist, Dr. Claudell, and [facility]. - Scheduled to undergo a kidney transplant.   Hypertension: - Medications: Procardia  90 mg daily.  - Compliance: *** - Checking BP at home: *** - Denies any SOB, recurrent headaches, CP, vision changes, LE edema, dizziness, palpitations, or medication side effects. - Diet: *** - Exercise: *** - Stressors: *** BP Readings from Last 3 Encounters:  01/07/24 135/87  12/02/23 132/74  11/21/23 127/76    Hyperkalemia: - Managed with Lokelma  10g daily. Lab Results  Component Value Date   K 4.5 02/02/2024    Chronic Kidney Disease, stage IV: - Takes Sodium Bicarbonate  1300 mg TID Lab Results  Component Value Date   NA 142 02/02/2024   CL 103 02/02/2024   K 4.5 02/02/2024   CO2 25 (A) 02/02/2024   BUN 29 (A) 02/02/2024   CREATININE 2.9 (A) 02/02/2024   EGFR 18 02/02/2024   CALCIUM  9.6 02/02/2024   PHOS 4.6 01/07/2024   ALBUMIN 4.5 02/02/2024   GLUCOSE 92 01/07/2024    Anemia; Thrombocytosis: Anemia status: {Blank single:19197::controlled,uncontrolled,better,worse,exacerbated,stable} Etiology of anemia: *** Duration of anemia treatment: Ferrous Gluconate  240 BID.   Compliance with treatment: {Blank single:19197::excellent compliance,good compliance,fair compliance,poor compliance} Iron supplementation side effects: {Blank single:19197::yes,no} Severity of anemia: {Blank single:19197::mild,moderate,severe} Fatigue: {Blank single:19197::yes,no} Decreased exercise tolerance: {Blank single:19197::yes,no}  Dyspnea on exertion: {Blank single:19197::yes,no} Palpitations: {Blank  single:19197::yes,no} Bleeding: {Blank single:19197::yes,no} Pica: {Blank single:19197::yes,no}     Latest Ref Rng & Units 02/02/2024   12:00 AM 01/07/2024    4:52 PM 11/21/2023    3:36 AM  CBC  WBC  6.5     6.4  4.9   Hemoglobin 12.0 - 16.0 10.3     10.6  8.9   Hematocrit 36 - 46 32     33.6  27.9   Platelets 150 - 400 K/uL 371     392  249      This result is from an external source.   Lab Results  Component Value Date   IRON 37 02/02/2024   IRON 65 11/20/2023    Lab Results  Component Value Date   UIBC 278 02/02/2024   UIBC 268 11/20/2023    Lab Results  Component Value Date   TIBC 315 02/02/2024   TIBC 333 11/20/2023    Lab Results  Component Value Date   IRONPCTSAT 12 02/02/2024   IRONPCTSAT 20 11/20/2023    Lab Results  Component Value Date   FERRITIN 37 02/02/2024   FERRITIN 44 11/20/2023       Show/hide medication list[1] Past Medical History:  Diagnosis Date   Hypertension    states under control with med., has been on med. x 2 yr.   Trimalleolar fracture of ankle, closed 08/03/2016   right    Past Surgical History:  Procedure Laterality Date   NO PAST SURGERIES     ORIF ANKLE FRACTURE Right 08/07/2016   Procedure: OPEN REDUCTION INTERNAL FIXATION (ORIF) RIGHT ANKLE FRACTURE;  Surgeon: Kay CHRISTELLA Cummins, MD;  Location: [facility];  Service: Orthopedics;  Laterality: Right;   RADIOLOGY WITH ANESTHESIA N/A 03/20/2023   Procedure:  MRI OF ABDOMEN WITH AND WITHOUT CONTRAST WITH ANESTHESIA;  Surgeon: Radiologist, Medication, MD;  Location: [facility] OR;  Service: Radiology;  Laterality: N/A;     No family history on file.  Social History   Socioeconomic History   Marital status: Single    Spouse name: Not on file   Number of children: 2   Years of education: 12   Highest education level: GED or equivalent  Occupational History   Occupation: EDS  Tobacco Use   Smoking status: Never   Smokeless tobacco: Never  Vaping Use   Vaping  status: Never Used  Substance and Sexual Activity   Alcohol use: No    Alcohol/week: 0.0 standard drinks of alcohol   Drug use: No   Sexual activity: Not on file  Other Topics Concern   Not on file  Social History Narrative   Fun: Shop.   Denies abuse and feels safe at home.    Social Drivers of Health   Tobacco Use: Low Risk  (06/08/2024)   [facility]   Patient History    Smoking Tobacco Use: Never    Smokeless Tobacco Use: Never    Passive Exposure: Not on file  Financial Resource Strain: Low Risk (12/24/2022)   Overall Financial Resource Strain (CARDIA)    Difficulty of Paying Living Expenses: Not hard at all  Food Insecurity: No Food Insecurity (11/20/2023)   Hunger Vital Sign    Worried About Running Out of Food in the Last Year: Never true    Ran Out of Food in the Last Year: Never true  Transportation Needs: No Transportation Needs (11/20/2023)   PRAPARE - Administrator, Civil Service (Medical): No    Lack of Transportation (Non-Medical): No  Physical Activity: Sufficiently Active (12/24/2022)   Exercise Vital Sign    Days of Exercise per Week: 7 days    Minutes of Exercise per Session: 150+ min  Stress: No Stress Concern Present (12/24/2022)   [employer] of Occupational Health - Occupational Stress Questionnaire    Feeling of Stress : Not at all  Social Connections: Unknown (12/24/2022)   Social Connection and Isolation Panel    Frequency of Communication with Friends and Family: More than three times a week    Frequency of Social Gatherings with Friends and Family: More than three times a week    Attends Religious Services: Never    Database Administrator or Organizations: No    Attends Engineer, Structural: Not on file    Marital Status: Patient declined  Depression (PHQ2-9): Low Risk (12/31/2022)   Depression (PHQ2-9)    PHQ-2 Score: 0  Alcohol Screen: Not on file  Housing: Low Risk (11/20/2023)   Housing  Stability Vital Sign    Unable to Pay for Housing in the Last Year: No    Number of Times Moved in the Last Year: 0    Homeless in the Last Year: No  Utilities: Not At Risk (11/20/2023)   [facility] Utilities    Threatened with loss of utilities: No  Health Literacy: Not on file       ROS All review of systems negative except what is listed in the HPI    Objective     There were no vitals taken for this visit.  Physical Exam     Assessment & Plan:     Problem List Items Addressed This Visit   None  No follow-ups on file.  Waddell KATHEE Mon, NP  I,Emily Lagle,acting as a scribe for Waddell KATHEE Mon, NP.,have documented all relevant documentation on the behalf of Waddell KATHEE Mon, NP.  I, Waddell KATHEE Mon, NP, have reviewed all documentation for this visit. The documentation on 08/11/2024 for the exam, diagnosis, procedures, and orders are all accurate and complete.     [1]  Outpatient Medications Prior to Visit  Medication Sig   ferrous gluconate  (FERGON) 240 (27 FE) MG tablet Take 1 (one) tablet by mouth two times daily   NIFEdipine  (PROCARDIA  XL/NIFEDICAL-XL) 90 MG 24 hr tablet Take 1 tablet (90 mg total) by mouth daily.   ondansetron  (ZOFRAN ) 4 MG tablet Take 1 tablet (4 mg total) by mouth every 8 (eight) hours as needed for nausea. (Patient not taking: Reported on 12/02/2023)   sodium bicarbonate  650 MG tablet Take 2 tablets by mouth three times daily   sodium zirconium cyclosilicate  (LOKELMA ) 10 g PACK packet Take 1 packet by mouth as directed daily   sodium zirconium cyclosilicate  (LOKELMA ) 10 g PACK packet Take 1 packet by mouth as directed daily   sodium zirconium cyclosilicate  (LOKELMA ) 5 g packet mix and take 2 packets by mouth 2 (two) times daily. (Patient taking differently: Take 10 g by mouth daily. Once daily)   No facility-administered medications prior to visit.   "

## 2024-08-11 ENCOUNTER — Encounter: Payer: Self-pay | Admitting: Family Medicine

## 2024-08-11 ENCOUNTER — Other Ambulatory Visit (HOSPITAL_COMMUNITY): Payer: Self-pay

## 2024-08-11 ENCOUNTER — Ambulatory Visit: Admitting: Family Medicine

## 2024-08-11 ENCOUNTER — Encounter (HOSPITAL_COMMUNITY): Payer: Self-pay

## 2024-08-11 VITALS — BP 120/75 | HR 96 | Temp 98.1°F | Ht 66.0 in | Wt 210.6 lb

## 2024-08-11 DIAGNOSIS — I10 Essential (primary) hypertension: Secondary | ICD-10-CM

## 2024-08-11 DIAGNOSIS — N184 Chronic kidney disease, stage 4 (severe): Secondary | ICD-10-CM | POA: Diagnosis not present

## 2024-08-11 DIAGNOSIS — E875 Hyperkalemia: Secondary | ICD-10-CM

## 2024-08-11 DIAGNOSIS — R739 Hyperglycemia, unspecified: Secondary | ICD-10-CM

## 2024-08-11 DIAGNOSIS — C480 Malignant neoplasm of retroperitoneum: Secondary | ICD-10-CM | POA: Diagnosis not present

## 2024-08-11 DIAGNOSIS — D631 Anemia in chronic kidney disease: Secondary | ICD-10-CM

## 2024-08-11 LAB — CBC WITH DIFFERENTIAL/PLATELET
Basophils Absolute: 0.1 10*3/uL (ref 0.0–0.1)
Basophils Relative: 1.4 % (ref 0.0–3.0)
Eosinophils Absolute: 0.2 10*3/uL (ref 0.0–0.7)
Eosinophils Relative: 3.6 % (ref 0.0–5.0)
HCT: 33.4 % — ABNORMAL LOW (ref 36.0–46.0)
Hemoglobin: 11 g/dL — ABNORMAL LOW (ref 12.0–15.0)
Lymphocytes Relative: 28.5 % (ref 12.0–46.0)
Lymphs Abs: 1.8 10*3/uL (ref 0.7–4.0)
MCHC: 32.9 g/dL (ref 30.0–36.0)
MCV: 90.7 fl (ref 78.0–100.0)
Monocytes Absolute: 0.4 10*3/uL (ref 0.1–1.0)
Monocytes Relative: 5.9 % (ref 3.0–12.0)
Neutro Abs: 3.9 10*3/uL (ref 1.4–7.7)
Neutrophils Relative %: 60.6 % (ref 43.0–77.0)
Platelets: 364 10*3/uL (ref 150.0–400.0)
RBC: 3.68 Mil/uL — ABNORMAL LOW (ref 3.87–5.11)
RDW: 13.4 % (ref 11.5–15.5)
WBC: 6.4 10*3/uL (ref 4.0–10.5)

## 2024-08-11 LAB — COMPREHENSIVE METABOLIC PANEL WITH GFR
ALT: 10 U/L (ref 3–35)
AST: 13 U/L (ref 5–37)
Albumin: 4.3 g/dL (ref 3.5–5.2)
Alkaline Phosphatase: 64 U/L (ref 39–117)
BUN: 36 mg/dL — ABNORMAL HIGH (ref 6–23)
CO2: 28 meq/L (ref 19–32)
Calcium: 9.8 mg/dL (ref 8.4–10.5)
Chloride: 102 meq/L (ref 96–112)
Creatinine, Ser: 2.81 mg/dL — ABNORMAL HIGH (ref 0.40–1.20)
GFR: 18.01 mL/min — ABNORMAL LOW
Glucose, Bld: 102 mg/dL — ABNORMAL HIGH (ref 70–99)
Potassium: 4.6 meq/L (ref 3.5–5.1)
Sodium: 139 meq/L (ref 135–145)
Total Bilirubin: 0.3 mg/dL (ref 0.2–1.2)
Total Protein: 7.9 g/dL (ref 6.0–8.3)

## 2024-08-11 LAB — LIPID PANEL
Cholesterol: 171 mg/dL (ref 28–200)
HDL: 42 mg/dL
LDL Cholesterol: 88 mg/dL (ref 10–99)
NonHDL: 128.91
Total CHOL/HDL Ratio: 4
Triglycerides: 204 mg/dL — ABNORMAL HIGH (ref 10.0–149.0)
VLDL: 40.8 mg/dL — ABNORMAL HIGH (ref 0.0–40.0)

## 2024-08-11 LAB — HEMOGLOBIN A1C: Hgb A1c MFr Bld: 6.3 % (ref 4.6–6.5)

## 2024-08-11 MED ORDER — NIFEDIPINE ER OSMOTIC RELEASE 90 MG PO TB24
90.0000 mg | ORAL_TABLET | Freq: Every day | ORAL | 3 refills | Status: AC
  Filled 2024-08-11: qty 90, 90d supply, fill #0

## 2024-08-11 NOTE — Assessment & Plan Note (Signed)
 Following with nephrology Continue Lokelma  10g daily Labs today

## 2024-08-11 NOTE — Assessment & Plan Note (Signed)
 Asymptomatic. Repeat labs today. Continue iron supplement.

## 2024-08-11 NOTE — Assessment & Plan Note (Signed)
 Following with nephrology Updating labs today No new concerns, stable.

## 2024-08-11 NOTE — Assessment & Plan Note (Signed)
 Blood pressure is at goal for age and co-morbidities.   Recommendations: nifedipine  90 mg daily - BP goal <130/80 - monitor and log blood pressures at home - check around the same time each day in a relaxed setting - Limit salt to <2000 mg/day - Follow DASH eating plan (heart healthy diet) - limit alcohol to 2 standard drinks per day for men and 1 per day for women - avoid tobacco products - get at least 2 hours of regular aerobic exercise weekly Patient aware of signs/symptoms requiring further/urgent evaluation. Labs updated today.

## 2024-08-11 NOTE — Assessment & Plan Note (Signed)
 S/p right nephrectomy in 2025 at [facility]  Following with [facility] oncology

## 2024-08-11 NOTE — Assessment & Plan Note (Signed)
 Repeat labs today.

## 2024-08-12 LAB — IBC + FERRITIN
Ferritin: 27.4 ng/mL (ref 10.0–291.0)
Iron: 64 ug/dL (ref 42–145)
Saturation Ratios: 18.4 % — ABNORMAL LOW (ref 20.0–50.0)
TIBC: 348.6 ug/dL (ref 250.0–450.0)
Transferrin: 249 mg/dL (ref 212.0–360.0)

## 2024-08-12 LAB — TSH: TSH: 1.13 u[IU]/mL (ref 0.35–5.50)

## 2024-08-12 LAB — VITAMIN D 25 HYDROXY (VIT D DEFICIENCY, FRACTURES): VITD: 15.08 ng/mL — ABNORMAL LOW (ref 30.00–100.00)

## 2024-08-13 ENCOUNTER — Other Ambulatory Visit (HOSPITAL_COMMUNITY): Payer: Self-pay

## 2024-08-13 ENCOUNTER — Ambulatory Visit: Payer: Self-pay | Admitting: Family Medicine

## 2024-08-13 ENCOUNTER — Encounter (HOSPITAL_COMMUNITY): Payer: Self-pay

## 2024-08-13 ENCOUNTER — Other Ambulatory Visit: Payer: Self-pay

## 2024-08-13 DIAGNOSIS — E559 Vitamin D deficiency, unspecified: Secondary | ICD-10-CM | POA: Insufficient documentation

## 2024-08-13 MED ORDER — VITAMIN D (ERGOCALCIFEROL) 1.25 MG (50000 UNIT) PO CAPS
50000.0000 [IU] | ORAL_CAPSULE | ORAL | 0 refills | Status: AC
  Filled 2024-08-13: qty 12, 84d supply, fill #0

## 2024-10-20 ENCOUNTER — Ambulatory Visit

## 2025-02-08 ENCOUNTER — Ambulatory Visit: Admitting: Family Medicine
